# Patient Record
Sex: Female | Born: 1945 | Race: White | Hispanic: No | Marital: Married | Smoking: Former smoker
Health system: Southern US, Community
[De-identification: ages and names within clinical notes are randomized; demographics above are authoritative.]

## PROBLEM LIST (undated history)

## (undated) DIAGNOSIS — M5030 Other cervical disc degeneration, unspecified cervical region: Secondary | ICD-10-CM

## (undated) DIAGNOSIS — Z8679 Personal history of other diseases of the circulatory system: Secondary | ICD-10-CM

## (undated) DIAGNOSIS — K589 Irritable bowel syndrome without diarrhea: Secondary | ICD-10-CM

## (undated) DIAGNOSIS — M47812 Spondylosis without myelopathy or radiculopathy, cervical region: Secondary | ICD-10-CM

## (undated) DIAGNOSIS — R32 Unspecified urinary incontinence: Secondary | ICD-10-CM

## (undated) DIAGNOSIS — K529 Noninfective gastroenteritis and colitis, unspecified: Secondary | ICD-10-CM

## (undated) DIAGNOSIS — F419 Anxiety disorder, unspecified: Secondary | ICD-10-CM

## (undated) DIAGNOSIS — S52502A Unspecified fracture of the lower end of left radius, initial encounter for closed fracture: Secondary | ICD-10-CM

## (undated) DIAGNOSIS — F32A Depression, unspecified: Secondary | ICD-10-CM

## (undated) DIAGNOSIS — I341 Nonrheumatic mitral (valve) prolapse: Secondary | ICD-10-CM

## (undated) DIAGNOSIS — F329 Major depressive disorder, single episode, unspecified: Secondary | ICD-10-CM

## (undated) DIAGNOSIS — G243 Spasmodic torticollis: Secondary | ICD-10-CM

## (undated) DIAGNOSIS — Z973 Presence of spectacles and contact lenses: Secondary | ICD-10-CM

## (undated) HISTORY — PX: CARDIAC CATHETERIZATION: SHX172

## (undated) HISTORY — PX: CATARACT EXTRACTION W/ INTRAOCULAR LENS  IMPLANT, BILATERAL: SHX1307

## (undated) HISTORY — PX: CATARACT EXTRACTION W/ INTRAOCULAR LENS IMPLANT: SHX1309

## (undated) HISTORY — PX: DILATION AND CURETTAGE OF UTERUS: SHX78

---

## 1998-09-17 ENCOUNTER — Ambulatory Visit (HOSPITAL_COMMUNITY): Admission: RE | Admit: 1998-09-17 | Discharge: 1998-09-17 | Payer: Self-pay | Admitting: Obstetrics and Gynecology

## 1999-04-02 ENCOUNTER — Other Ambulatory Visit: Admission: RE | Admit: 1999-04-02 | Discharge: 1999-04-02 | Payer: Self-pay | Admitting: Obstetrics and Gynecology

## 1999-09-08 HISTORY — PX: ROTATOR CUFF REPAIR: SHX139

## 2000-04-04 ENCOUNTER — Inpatient Hospital Stay (HOSPITAL_COMMUNITY): Admission: EM | Admit: 2000-04-04 | Discharge: 2000-04-06 | Payer: Self-pay | Admitting: Emergency Medicine

## 2000-04-04 ENCOUNTER — Encounter: Payer: Self-pay | Admitting: Cardiology

## 2000-04-04 ENCOUNTER — Encounter: Payer: Self-pay | Admitting: Emergency Medicine

## 2000-04-05 ENCOUNTER — Encounter: Payer: Self-pay | Admitting: Gastroenterology

## 2000-04-27 ENCOUNTER — Ambulatory Visit (HOSPITAL_COMMUNITY): Admission: RE | Admit: 2000-04-27 | Discharge: 2000-04-28 | Payer: Self-pay | Admitting: General Surgery

## 2000-04-27 ENCOUNTER — Encounter (INDEPENDENT_AMBULATORY_CARE_PROVIDER_SITE_OTHER): Payer: Self-pay | Admitting: *Deleted

## 2000-04-27 HISTORY — PX: LAPAROSCOPIC CHOLECYSTECTOMY: SUR755

## 2000-08-18 ENCOUNTER — Other Ambulatory Visit: Admission: RE | Admit: 2000-08-18 | Discharge: 2000-08-18 | Payer: Self-pay | Admitting: Obstetrics and Gynecology

## 2001-03-30 ENCOUNTER — Encounter (INDEPENDENT_AMBULATORY_CARE_PROVIDER_SITE_OTHER): Payer: Self-pay | Admitting: *Deleted

## 2001-03-30 ENCOUNTER — Ambulatory Visit (HOSPITAL_COMMUNITY): Admission: RE | Admit: 2001-03-30 | Discharge: 2001-03-30 | Payer: Self-pay | Admitting: *Deleted

## 2001-03-30 HISTORY — PX: COLONOSCOPY W/ POLYPECTOMY: SHX1380

## 2002-04-22 ENCOUNTER — Other Ambulatory Visit: Admission: RE | Admit: 2002-04-22 | Discharge: 2002-04-22 | Payer: Self-pay | Admitting: Obstetrics and Gynecology

## 2002-07-05 ENCOUNTER — Encounter: Admission: RE | Admit: 2002-07-05 | Discharge: 2002-07-05 | Payer: Self-pay | Admitting: Obstetrics and Gynecology

## 2002-07-05 ENCOUNTER — Encounter: Payer: Self-pay | Admitting: Obstetrics and Gynecology

## 2003-04-24 ENCOUNTER — Other Ambulatory Visit: Admission: RE | Admit: 2003-04-24 | Discharge: 2003-04-24 | Payer: Self-pay | Admitting: Obstetrics and Gynecology

## 2003-05-28 ENCOUNTER — Encounter: Admission: RE | Admit: 2003-05-28 | Discharge: 2003-07-17 | Payer: Self-pay | Admitting: Family Medicine

## 2004-05-04 ENCOUNTER — Other Ambulatory Visit: Admission: RE | Admit: 2004-05-04 | Discharge: 2004-05-04 | Payer: Self-pay | Admitting: Obstetrics and Gynecology

## 2005-05-18 ENCOUNTER — Other Ambulatory Visit: Admission: RE | Admit: 2005-05-18 | Discharge: 2005-05-18 | Payer: Self-pay | Admitting: Obstetrics and Gynecology

## 2006-05-23 ENCOUNTER — Other Ambulatory Visit: Admission: RE | Admit: 2006-05-23 | Discharge: 2006-05-23 | Payer: Self-pay | Admitting: Obstetrics and Gynecology

## 2007-06-13 ENCOUNTER — Other Ambulatory Visit: Admission: RE | Admit: 2007-06-13 | Discharge: 2007-06-13 | Payer: Self-pay | Admitting: Obstetrics and Gynecology

## 2008-06-16 ENCOUNTER — Ambulatory Visit (HOSPITAL_COMMUNITY): Admission: RE | Admit: 2008-06-16 | Discharge: 2008-06-16 | Payer: Self-pay | Admitting: Family Medicine

## 2008-06-23 ENCOUNTER — Other Ambulatory Visit: Admission: RE | Admit: 2008-06-23 | Discharge: 2008-06-23 | Payer: Self-pay | Admitting: Obstetrics and Gynecology

## 2009-07-03 ENCOUNTER — Ambulatory Visit (HOSPITAL_COMMUNITY): Admission: RE | Admit: 2009-07-03 | Discharge: 2009-07-03 | Payer: Self-pay | Admitting: Family Medicine

## 2009-09-29 ENCOUNTER — Other Ambulatory Visit: Admission: RE | Admit: 2009-09-29 | Discharge: 2009-09-29 | Payer: Self-pay | Admitting: Obstetrics and Gynecology

## 2010-06-22 ENCOUNTER — Encounter: Admission: RE | Admit: 2010-06-22 | Discharge: 2010-06-22 | Payer: Self-pay | Admitting: Internal Medicine

## 2010-07-13 ENCOUNTER — Ambulatory Visit (HOSPITAL_COMMUNITY): Admission: RE | Admit: 2010-07-13 | Discharge: 2010-07-13 | Payer: Self-pay | Admitting: Family Medicine

## 2010-09-29 ENCOUNTER — Other Ambulatory Visit: Admission: RE | Admit: 2010-09-29 | Discharge: 2010-09-29 | Payer: Self-pay | Admitting: Obstetrics and Gynecology

## 2010-10-04 ENCOUNTER — Ambulatory Visit (HOSPITAL_COMMUNITY)
Admission: RE | Admit: 2010-10-04 | Discharge: 2010-10-04 | Payer: Self-pay | Source: Home / Self Care | Admitting: Internal Medicine

## 2010-11-27 ENCOUNTER — Encounter: Payer: Self-pay | Admitting: Internal Medicine

## 2010-12-13 ENCOUNTER — Other Ambulatory Visit: Payer: Self-pay | Admitting: Internal Medicine

## 2010-12-13 DIAGNOSIS — K529 Noninfective gastroenteritis and colitis, unspecified: Secondary | ICD-10-CM

## 2010-12-13 DIAGNOSIS — R1031 Right lower quadrant pain: Secondary | ICD-10-CM

## 2010-12-13 DIAGNOSIS — R140 Abdominal distension (gaseous): Secondary | ICD-10-CM

## 2010-12-15 ENCOUNTER — Ambulatory Visit
Admission: RE | Admit: 2010-12-15 | Discharge: 2010-12-15 | Disposition: A | Discharge status: Home / Self Care | Payer: Medicare Other | Source: Ambulatory Visit | Attending: Internal Medicine | Admitting: Internal Medicine

## 2010-12-15 DIAGNOSIS — K529 Noninfective gastroenteritis and colitis, unspecified: Secondary | ICD-10-CM

## 2010-12-15 DIAGNOSIS — R1031 Right lower quadrant pain: Secondary | ICD-10-CM

## 2010-12-15 DIAGNOSIS — R140 Abdominal distension (gaseous): Secondary | ICD-10-CM

## 2010-12-15 MED ORDER — IOHEXOL 300 MG/ML  SOLN
100.0000 mL | Freq: Once | INTRAMUSCULAR | Status: AC | PRN
  Administered 2010-12-15: 100 mL via INTRAVENOUS

## 2011-03-25 NOTE — Cardiovascular Report (Signed)
[place]. [facility]  Patient:    Dana [name], Dana [name]                     MRN: [id number] Proc. Date: 04/05/00 Adm. Date:  [id number] Disc. Date: [id number] Attending:  Rosanne Sack CC:         Anna Genre. Little, M.D.                        Cardiac Catheterization  INDICATIONS FOR PROCEDURE:  [name] is a 65 year old female who was admitted with anginal-type pain.  Cardiac enzymes and EKG are negative.  PROCEDURES: 1. Left heart catheterization. 2. Selective right and left coronary arteriography. 3. Ventriculography in the RAO projection.  DESCRIPTION OF PROCEDURE:  The patient was prepped and draped in the usual sterile fashion.  Exposing the right groin, local anesthetic administered with 1% Xylocaine, the Seldinger technique was employed and a 6-French introducer sheath was placed into the right femoral artery.  Selective right and left coronary arteriography and ventriculography in the RAO projection was performed.  Then 6-French Judkins configuration catheters were used.  HEMODYNAMIC MONITORING: 1. Central aortic pressure:  133/57. 2. Left ventricular pressure:  135/14. There was no significant aortic valve gradient noted at time of pullback.  VENTRICULOGRAPHY:  Done in the RAO projection, revealed normal left ventricular systolic function.  The ejection fraction is greater than 60%. Mild mitral regurgitation was seen and mitral valve prolapse was noted.  CORONARY ARTERIOGRAPHY:  There was no calcification noted on fluoroscopy. 1. LEFT MAIN:  Normal. 2. LEFT ANTERIOR DESCENDING ARTERY:  Gave rise to a diagonal, with two small    branches.  The LAD itself crossed the apex, but the heart was free of    disease as were the diagonals. 3. CIRCUMFLEX:  Had two large OM vessels, all were free of disease. 4. RIGHT CORONARY ARTERY:  Gave rise to a PDA only.  This system was free    of disease.  There was minor dampening as the catheter was  engaged, but    cusp injection shows the ostium of the RCA to be normal.  CONCLUSION: 1. Normal left ventricular systolic function. 2. Mitral valve prolapse, with mild mitral regurgitation. 3. No coronary disease.  DISCUSSION:  At this point, I have no explanation for her chest pain; it is clear it is not cardiac in origin.  She is referred back to Dr. Catha Gosselin for follow-up. DD:  04/05/00 TD:  04/06/00 Job: [id number]

## 2011-03-25 NOTE — Op Note (Signed)
[place]. [facility]  Patient:    Dana [name], Dana [name]                     MRN: [id number] Proc. Date: 04/27/00 Adm. Date:  [id number] Disc. Date: [id number] Attending:  Arlis Porta CC:         Anna Genre. Little, M.D.             Thereasa Solo. Little, M.D.                           Operative Report  PREOPERATIVE DIAGNOSIS:  Symptomatic cholelithiasis.  POSTOPERATIVE DIAGNOSIS:  Chronic calculus cholecystitis.  PROCEDURE:  Laparoscopic cholecystectomy.  SURGEON:  Adolph Pollack, M.D.  ASSISTANT:  Anselm Pancoast. Zachery Dakins, M.D.  ANESTHESIA:  General Kaylyn Layer. Michelle Piper, M.D.  INDICATIONS:  This is a 65 year old female with classic biliary colic and was found to have gallstones.  She has normal preoperative liver function tests, and a normal common bile duct diameter.  She presents now for cholecystectomy. She has documented mitral valve prolapse by echocardiogram and has received ampicillin and gentamicin in the holding room preoperatively.  DESCRIPTION OF PROCEDURE:  The patient was placed supine on the operating table and a general anesthetic was administered.  The abdomen was sterilely prepped and draped.  Local anesthetic consisting of 0.5% Marcaine was infiltrated in the subumbilical area.  A subumbilical incision was made incising the skin sharply.  Using blunt dissection, I identified the midline fascia and made a 1 cm incision in the fascia.  A pursestring suture of 0 Vicryl was placed around the fascial edges.  The peritoneal cavity was then entered bluntly and under direct vision.  A Hassan trocar was introduced into the peritoneal cavity and a pneumoperitoneum was created by insufflation of CO2 gas.  The laparoscope was introduced and the right upper quadrant area visualization.  There were adhesions between the omentum and the gallbladder fundus noted.  An 11 mm incision was made in the epigastrium and a similar size trocar was placed in the  peritoneal cavity through the incision.  Two 5 mm incisions were made in the right abdomen through which similar sized trocars were placed into the peritoneal cavity.  The fundus of the gallbladder was grasped and omental adhesions were dissected free from the gallbladder bluntly.  Also, the fundus was retracted toward the right clavicular area and further adhesions between the omentum and duodenum were cleared and dissected free bluntly.  Next, the infundibulum was grasped and it was able to mobilize the infundibulum.  I then identified the junction of the cystic duct and gallbladder, and isolated this area.  I also was able to identify the cystic artery and isolate it.  Because she had normal liver function tests and normal common bile duct diameter, I elected not to perform cholangiogram.  The cystic duct was clipped three times proximally, once distally, and divided sharply.  The cystic artery was clipped twice proximally, once distally, and divided.  Using the electrocautery, I dissected the gallbladder free from the liver bed.  There was a little bit of bleeding noted that came from a crossing vein between the cystic duct and cystic artery, and this was controlled with one clip.  Once the gallbladder was removed from its fossa, I examined the gallbladder fossa, irrigating it.  I did not notice any evidence of bile leakage or bleeding.  The gallbladder was then removed through  the subumbilical incision and the subumbilical fascial defect closed by tightening up and tying down the pursestring suture.  The fascial closure was inspected with the laparoscope and noted to be solid. Once again, reinspected the liver bed, irrigated it, and noticed no bleeding or bile leakage.  The irrigation fluid was evacuated.  The trocars were then removed under direct vision and no bleeding was noted.  The pneumoperitoneum was released.  The skin incisions were closed with 4-0 Monocryl subcuticular  stitches followed by Steri-Strips and sterile dressings.  She tolerated the procedure well without any apparent complications and was taken to the recovery room in satisfactory condition. DD:  04/27/00 TD:  04/29/00 Job: [id number]

## 2011-03-25 NOTE — H&P (Signed)
[place]. [facility]  Patient:    Dana [name], Dana [name]                     MRN: [id number] Adm. Date:  04/04/00                         History and Physical  CHIEF COMPLAINT:  Chest pain.  HISTORY OF PRESENT ILLNESS:  This 65 year old white female with history of hypertension and mitral valve prolapse proven by 2-D echocardiogram is admitted for evaluation of chest pain.  The patient had an episode approximately two weeks prior to admission of a tight squeezing sensation in the left precordial area which moved across the chest towards the right and was unassociated with radiation to the neck, arms, or back.  There was no associated shortness of breath, nausea or vomiting, or diaphoresis.  This episode lasted approximately 30 minutes and then spontaneously resolved.  She did note that it was worsened with movement and had some minimal pleuritic component but no cough and no hemoptysis.  Following that episode, she was asymptomatic up until this afternoon at approximately 1330 hours when she had the sudden onset of again the left precordial squeezing discomfort.  This time it was associated with the development of nausea, diaphoresis, shortness of breath, and she had radiation to her mid back.  It lasted approximately 45 minutes.  She was transported to [facility] Emergency Room.  En route she had some improvement, and following administration of 1 sublingual nitroglycerin tablet, she did have slow resolution of her symptoms over the course of 10 minutes.  At the present time, she is asymptomatic. EKG in the emergency room did show no acute changes.  Of note is the fact that, over the past 30 days, the patient has noted increasing problems with shortness of breath with activities.  Housework has taken her 50% longer to do.  She used to be active on the treadmill 30 minutes, but now after 10 minutes, she develops a tightness in her chest that radiates into the left  upper extremity and resolves with rest.  She does not smoke.  There is no alcohol.  There is no history of any leg or calf pain.  No history of any cough or hemoptysis. Recent April 2001 lab showed a cholesterol of 194 and LDL of 138, HDL 40, triglycerides 84.  TSH was normal.  Of note is the fact that she did not take her antihypertensive medication this morning.  ALLERGIES:  None.  CURRENT MEDICATIONS: 1. Cardizem CD 240, no medications taken today. 2. Premarin/Aygestin 0.625/2.5 daily 3. 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  HOSPITALIZATIONS IN THE PAST:  Childbirth x 2. D&C.  Right shoulder surgery November 2000.  ILLNESSES:  Hypertension, MVP on 2-D echocardiogram, history of pericarditis in 1989.  FAMILY HISTORY:  Her mother is alive at age 76 with hypertension.  Father is deceased secondary to leukemia.  He also had hypertension and stroke. Siblings: Two brothers, one with hypertension.  One sister with hypertension.  SOCIAL HISTORY:  Married 30 years, two children alive and well.  She has not smoked, no alcohol.  She was exercising on a regular basis but has been limited as noted above.  ______ booster in 1996.  REVIEW OF SYSTEMS: ENT: Does wear glasses.  Cardiorespiratory: See above.  GI is unremarkable.  GU is negative.  She sees Dr. Lafonda Mosses  Collins for routine examinations.  Neuromuscular and endocrine are unremarkable.  PHYSICAL EXAMINATION:  GENERAL:  At the time of admission, examination reveals a well-developed, well-nourished white female in no acute distress, asymptomatic.  CHEST:  No chest pain.  SKIN:  Warm and dry.  VITAL SIGNS:  Blood pressure 189/99, pulse 82 and regular, respirations 18, unlabored.  HEENT:  Examination including funduscopic is negative.  NECK: Supple.  Carotids 2+, no bruits.  No masses, no thyroid enlargement.  CHEST:  Clear to percussion and auscultation without rales or  wheezes.  COR:  Regular rhythm without appreciable murmurs, gallops, clicks, or rubs.  ABDOMEN:  Soft and nontender without appreciated mass. Bowel sounds are active.  No CVA discomfort.  No rebound, no masses.  BREASTS:  Without discharge or masses.  PELVIC/RECTAL:  Exam deferred at this point.  Done by Dr. Artist Pais.  EXTREMITIES:  Full range of motion of all four, no edema.  Pedal pulses 2+ and symmetric.  NEUROLOGIC:  Intact without deficits.  LABORATORY DATA:  Pending.  EKG shows normal sinus rhythm without any acute change.  IMPRESSION: 1. Chest pain, history of dyspnea on exertion, and chest discomfort with    activities x 1 month.  Rule out atherosclerotic coronary artery disease    with angina, unstable.  Rule out myocardial infarction.  Doubt she has    pericarditis.  Doubt pulmonary embolus. 2. Mitral valve prolapse by 2-D echocardiogram. 3. History of hypertension, active. 4. History of spasmodic torticollis.  PLAN:  She is admitted to a monitored bed.  Start aspirin, beta blocker, Imdur.  Give her a dose of Cardizem CD which she missed today.  Studies with troponin, CK, repeat EKG in the morning.  Cardiology consultation.  With her history of mitral valve prolapse, she is probably going to be a candidate for cardiac catheterization if she rules out. DD:  04/04/00 TD:  04/04/00 Job: [id number]

## 2011-03-25 NOTE — Procedures (Signed)
[place]. [facility]  Patient:    Dana [name], Dana [name]                     MRN: [id number] Proc. Date: 03/30/01 Adm. Date:  [id number] Attending:  Mingo Amber CC:         Rande Brunt Thomasena Edis, M.D.   Procedure Report  PROCEDURE:  Video colonoscopy.  INDICATIONS:  A 65 year old female with chronic mild diarrhea previously attributed to irritable bowel syndrome, who requests colonoscopic screening as well.  PREPARATION:  Fleets Phospho-Soda and clear liquid diet.  The mucosa is clean throughout.  DEPTH OF INSERTION:  Terminal ileum.  PREPROCEDURE SEDATION:  She received a total of 100 mg of Demerol and 10 mg of Versed intravenously.  In addition, she was on 2 L of nasal cannula O2.  DESCRIPTION OF PROCEDURE:  The Olympus video colonoscope was inserted via the rectum and advanced through a tortuous sigmoid to the splenic flexure.  From this point onward to the cecum, advancement was technically much easier.  The ileocecal valve was traversed, and the terminal ileum appeared normal, as seen in photograph #2.  In photograph #1, there is mild scope trauma in the periappendiceal area.  On withdrawal, the mucosa was carefully evaluated. Multiple random biopsies were taken to rule out significant causes of chronic diarrhea.  In the rectum at 18 cm was a diminutive polyp, removed with the biopsy forceps.  Retroflexed view of the rectum was normal.  The patient tolerated the procedure well.  Pulse, blood pressure, and oximetry testing were stable throughout.  There were no noted complications.  She was observed in recovery for one hour and discharged home alert with a benign abdomen.  IMPRESSION:  Probable normal colonoscopy in the face of irritable bowel syndrome, other than a diminutive rectal polyp, which has been removed.  PLAN:  The patient will be notified of the histology and any necessary follow-up.  If the polyp proves to be adenomatous,  consideration could be given to repeat colonoscopy in three to five years. DD:  03/30/01 TD:  03/30/01 Job: [id number]

## 2012-01-09 ENCOUNTER — Other Ambulatory Visit (HOSPITAL_COMMUNITY): Payer: Self-pay | Admitting: *Deleted

## 2012-01-11 ENCOUNTER — Ambulatory Visit (HOSPITAL_COMMUNITY)
Admission: RE | Admit: 2012-01-11 | Discharge: 2012-01-11 | Disposition: A | Discharge status: Home / Self Care | Payer: Medicare Other | Source: Ambulatory Visit | Attending: Nurse Practitioner | Admitting: Nurse Practitioner

## 2012-01-11 ENCOUNTER — Encounter (HOSPITAL_COMMUNITY): Payer: Self-pay

## 2012-01-11 DIAGNOSIS — M810 Age-related osteoporosis without current pathological fracture: Secondary | ICD-10-CM | POA: Insufficient documentation

## 2012-01-11 MED ORDER — ZOLEDRONIC ACID 5 MG/100ML IV SOLN
5.0000 mg | Freq: Once | INTRAVENOUS | Status: AC
  Administered 2012-01-11: 5 mg via INTRAVENOUS
  Filled 2012-01-11: qty 100

## 2012-01-11 MED ORDER — SODIUM CHLORIDE 0.9 % IV SOLN
Freq: Once | INTRAVENOUS | Status: AC
  Administered 2012-01-11: 14:00:00 via INTRAVENOUS

## 2012-01-11 NOTE — Discharge Instructions (Signed)
Zoledronic Acid injection (Paget's Disease, Osteoporosis) What is this medicine? ZOLEDRONIC ACID (ZOE le dron ik AS id) lowers the amount of calcium loss from bone. It is used to treat Paget's disease and osteoporosis in women. This medicine may be used for other purposes; ask your health care provider or pharmacist if you have questions. What should I tell my health care provider before I take this medicine? They need to know if you have any of these conditions: -aspirin-sensitive asthma -dental disease -kidney disease -low levels of calcium in the blood -past surgery on the parathyroid gland or intestines -an unusual or allergic reaction to zoledronic acid, other medicines, foods, dyes, or preservatives -pregnant or trying to get pregnant -breast-feeding How should I use this medicine? This medicine is for infusion into a vein. It is given by a health care professional in a hospital or clinic setting. Talk to your pediatrician regarding the use of this medicine in children. This medicine is not approved for use in children. Overdosage: If you think you have taken too much of this medicine contact a poison control center or emergency room at once. NOTE: This medicine is only for you. Do not share this medicine with others. What if I miss a dose? It is important not to miss your dose. Call your doctor or health care professional if you are unable to keep an appointment. What may interact with this medicine? -certain antibiotics given by injection -NSAIDs, medicines for pain and inflammation, like ibuprofen or naproxen -some diuretics like bumetanide, furosemide -teriparatide This list may not describe all possible interactions. Give your health care provider a list of all the medicines, herbs, non-prescription drugs, or dietary supplements you use. Also tell them if you smoke, drink alcohol, or use illegal drugs. Some items may interact with your medicine. What should I watch for while  using this medicine? Visit your doctor or health care professional for regular checkups. It may be some time before you see the benefit from this medicine. Do not stop taking your medicine unless your doctor tells you to. Your doctor may order blood tests or other tests to see how you are doing. Women should inform their doctor if they wish to become pregnant or think they might be pregnant. There is a potential for serious side effects to an unborn child. Talk to your health care professional or pharmacist for more information. You should make sure that you get enough calcium and vitamin D while you are taking this medicine. Discuss the foods you eat and the vitamins you take with your health care professional. Some people who take this medicine have severe bone, joint, and/or muscle pain. This medicine may also increase your risk for a broken thigh bone. Tell your doctor right away if you have pain in your upper leg or groin. Tell your doctor if you have any pain that does not go away or that gets worse. What side effects may I notice from receiving this medicine? Side effects that you should report to your doctor or health care professional as soon as possible: -allergic reactions like skin rash, itching or hives, swelling of the face, lips, or tongue -breathing problems -changes in vision -feeling faint or lightheaded, falls -jaw burning, cramping, or pain -muscle cramps, stiffness, or weakness -trouble passing urine or change in the amount of urine Side effects that usually do not require medical attention (report to your doctor or health care professional if they continue or are bothersome): -bone, joint, or muscle pain -fever -  irritation at site where injected -loss of appetite -nausea, vomiting -stomach upset -tired This list may not describe all possible side effects. Call your doctor for medical advice about side effects. You may report side effects to FDA at 1-800-FDA-1088. Where  should I keep my medicine? This drug is given in a hospital or clinic and will not be stored at home. NOTE: This sheet is a summary. It may not cover all possible information. If you have questions about this medicine, talk to your doctor, pharmacist, or health care provider.  2012, [employer]. (04/22/2011 9:08:15 AM) 

## 2013-03-25 ENCOUNTER — Other Ambulatory Visit: Payer: Self-pay | Admitting: Obstetrics and Gynecology

## 2013-03-25 ENCOUNTER — Other Ambulatory Visit (HOSPITAL_COMMUNITY)
Admission: RE | Admit: 2013-03-25 | Discharge: 2013-03-25 | Disposition: A | Discharge status: Home / Self Care | Payer: Medicare Other | Source: Ambulatory Visit | Attending: Obstetrics and Gynecology | Admitting: Obstetrics and Gynecology

## 2013-03-25 DIAGNOSIS — Z1151 Encounter for screening for human papillomavirus (HPV): Secondary | ICD-10-CM | POA: Insufficient documentation

## 2013-03-25 DIAGNOSIS — Z01419 Encounter for gynecological examination (general) (routine) without abnormal findings: Secondary | ICD-10-CM | POA: Insufficient documentation

## 2013-09-30 ENCOUNTER — Encounter (HOSPITAL_COMMUNITY): Payer: Self-pay | Admitting: Emergency Medicine

## 2013-09-30 ENCOUNTER — Emergency Department (HOSPITAL_COMMUNITY): Payer: No Typology Code available for payment source

## 2013-09-30 ENCOUNTER — Emergency Department (HOSPITAL_COMMUNITY)
Admission: EM | Admit: 2013-09-30 | Discharge: 2013-09-30 | Disposition: A | Discharge status: Home / Self Care | Payer: No Typology Code available for payment source | Attending: Emergency Medicine | Admitting: Emergency Medicine

## 2013-09-30 DIAGNOSIS — Y92410 Unspecified street and highway as the place of occurrence of the external cause: Secondary | ICD-10-CM | POA: Insufficient documentation

## 2013-09-30 DIAGNOSIS — Z880 Allergy status to penicillin: Secondary | ICD-10-CM | POA: Insufficient documentation

## 2013-09-30 DIAGNOSIS — M542 Cervicalgia: Secondary | ICD-10-CM

## 2013-09-30 DIAGNOSIS — R072 Precordial pain: Secondary | ICD-10-CM | POA: Insufficient documentation

## 2013-09-30 DIAGNOSIS — M810 Age-related osteoporosis without current pathological fracture: Secondary | ICD-10-CM | POA: Insufficient documentation

## 2013-09-30 DIAGNOSIS — H53149 Visual discomfort, unspecified: Secondary | ICD-10-CM | POA: Insufficient documentation

## 2013-09-30 DIAGNOSIS — Z882 Allergy status to sulfonamides status: Secondary | ICD-10-CM | POA: Insufficient documentation

## 2013-09-30 DIAGNOSIS — Y939 Activity, unspecified: Secondary | ICD-10-CM | POA: Insufficient documentation

## 2013-09-30 DIAGNOSIS — Z79899 Other long term (current) drug therapy: Secondary | ICD-10-CM | POA: Insufficient documentation

## 2013-09-30 DIAGNOSIS — Z87891 Personal history of nicotine dependence: Secondary | ICD-10-CM | POA: Insufficient documentation

## 2013-09-30 DIAGNOSIS — R079 Chest pain, unspecified: Secondary | ICD-10-CM

## 2013-09-30 DIAGNOSIS — R51 Headache: Secondary | ICD-10-CM | POA: Insufficient documentation

## 2013-09-30 MED ORDER — ONDANSETRON HCL 4 MG PO TABS: 4.0000 mg | ORAL_TABLET | Freq: Four times a day (QID) | ORAL | Status: DC

## 2013-09-30 MED ORDER — HYDROCODONE-ACETAMINOPHEN 5-325 MG PO TABS: 2.0000 | ORAL_TABLET | Freq: Four times a day (QID) | ORAL | Status: DC | PRN

## 2013-09-30 MED ORDER — ACETAMINOPHEN 325 MG PO TABS
650.0000 mg | ORAL_TABLET | Freq: Once | ORAL | Status: AC
Start: 2013-09-30 — End: 2013-09-30
  Administered 2013-09-30: 650 mg via ORAL
  Filled 2013-09-30: qty 2

## 2013-09-30 NOTE — ED Notes (Signed)
Pt returned from radiology.

## 2013-09-30 NOTE — ED Notes (Signed)
Pt back from CT
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
## 2013-10-01 NOTE — ED Provider Notes (Signed)
Medical screening examination/treatment/procedure(s) were performed by non-physician practitioner and as supervising physician I was immediately available for consultation/collaboration.  EKG Interpretation   None        Raeford Razor, MD 10/01/13 1436

## 2014-08-01 ENCOUNTER — Encounter (HOSPITAL_COMMUNITY): Payer: Self-pay

## 2014-08-01 ENCOUNTER — Ambulatory Visit (HOSPITAL_COMMUNITY)
Admission: RE | Admit: 2014-08-01 | Discharge: 2014-08-01 | Disposition: A | Discharge status: Home / Self Care | Payer: Medicare Other | Source: Ambulatory Visit | Attending: Obstetrics and Gynecology | Admitting: Obstetrics and Gynecology

## 2014-08-01 ENCOUNTER — Other Ambulatory Visit (HOSPITAL_COMMUNITY): Payer: Self-pay | Admitting: Obstetrics and Gynecology

## 2014-08-01 DIAGNOSIS — M810 Age-related osteoporosis without current pathological fracture: Secondary | ICD-10-CM | POA: Diagnosis not present

## 2014-08-01 MED ORDER — SODIUM CHLORIDE 0.9 % IV SOLN
Freq: Once | INTRAVENOUS | Status: AC
  Administered 2014-08-01: 15:00:00 via INTRAVENOUS

## 2014-08-01 MED ORDER — ZOLEDRONIC ACID 5 MG/100ML IV SOLN
5.0000 mg | Freq: Once | INTRAVENOUS | Status: AC
  Administered 2014-08-01: 5 mg via INTRAVENOUS
  Filled 2014-08-01: qty 100

## 2014-08-01 NOTE — Discharge Instructions (Signed)
Drink fluids/water as tolerated over next 72hrs °Tylenol or Ibuprofen OTC as directed °Continue calcium and Vit D as directed by your MDZoledronic Acid injection (Paget's Disease, Osteoporosis) °What is this medicine? °ZOLEDRONIC ACID (ZOE le dron ik AS id) lowers the amount of calcium loss from bone. It is used to treat Paget's disease and osteoporosis in women. °This medicine may be used for other purposes; ask your health care provider or pharmacist if you have questions. °COMMON BRAND NAME(S): Reclast, Zometa °What should I tell my health care provider before I take this medicine? °They need to know if you have any of these conditions: °-aspirin-sensitive asthma °-cancer, especially if you are receiving medicines used to treat cancer °-dental disease or wear dentures °-infection °-kidney disease °-low levels of calcium in the blood °-past surgery on the parathyroid gland or intestines °-receiving corticosteroids like dexamethasone or prednisone °-an unusual or allergic reaction to zoledronic acid, other medicines, foods, dyes, or preservatives °-pregnant or trying to get pregnant °-breast-feeding °How should I use this medicine? °This medicine is for infusion into a vein. It is given by a health care professional in a hospital or clinic setting. °Talk to your pediatrician regarding the use of this medicine in children. This medicine is not approved for use in children. °Overdosage: If you think you have taken too much of this medicine contact a poison control center or emergency room at once. °NOTE: This medicine is only for you. Do not share this medicine with others. °What if I miss a dose? °It is important not to miss your dose. Call your doctor or health care professional if you are unable to keep an appointment. °What may interact with this medicine? °-certain antibiotics given by injection °-NSAIDs, medicines for pain and inflammation, like ibuprofen or naproxen °-some diuretics like bumetanide,  furosemide °-teriparatide °This list may not describe all possible interactions. Give your health care provider a list of all the medicines, herbs, non-prescription drugs, or dietary supplements you use. Also tell them if you smoke, drink alcohol, or use illegal drugs. Some items may interact with your medicine. °What should I watch for while using this medicine? °Visit your doctor or health care professional for regular checkups. It may be some time before you see the benefit from this medicine. Do not stop taking your medicine unless your doctor tells you to. Your doctor may order blood tests or other tests to see how you are doing. °Women should inform their doctor if they wish to become pregnant or think they might be pregnant. There is a potential for serious side effects to an unborn child. Talk to your health care professional or pharmacist for more information. °You should make sure that you get enough calcium and vitamin D while you are taking this medicine. Discuss the foods you eat and the vitamins you take with your health care professional. °Some people who take this medicine have severe bone, joint, and/or muscle pain. This medicine may also increase your risk for jaw problems or a broken thigh bone. Tell your doctor right away if you have severe pain in your jaw, bones, joints, or muscles. Tell your doctor if you have any pain that does not go away or that gets worse. °Tell your dentist and dental surgeon that you are taking this medicine. You should not have major dental surgery while on this medicine. See your dentist to have a dental exam and fix any dental problems before starting this medicine. Take good care of your teeth while on   this medicine. Make sure you see your dentist for regular follow-up appointments. °What side effects may I notice from receiving this medicine? °Side effects that you should report to your doctor or health care professional as soon as possible: °-allergic reactions  like skin rash, itching or hives, swelling of the face, lips, or tongue °-anxiety, confusion, or depression °-breathing problems °-changes in vision °-eye pain °-feeling faint or lightheaded, falls °-jaw pain, especially after dental work °-mouth sores °-muscle cramps, stiffness, or weakness °-trouble passing urine or change in the amount of urine °Side effects that usually do not require medical attention (report to your doctor or health care professional if they continue or are bothersome): °-bone, joint, or muscle pain °-constipation °-diarrhea °-fever °-hair loss °-irritation at site where injected °-loss of appetite °-nausea, vomiting °-stomach upset °-trouble sleeping °-trouble swallowing °-weak or tired °This list may not describe all possible side effects. Call your doctor for medical advice about side effects. You may report side effects to FDA at 1-800-FDA-1088. °Where should I keep my medicine? °This drug is given in a hospital or clinic and will not be stored at home. °NOTE: This sheet is a summary. It may not cover all possible information. If you have questions about this medicine, talk to your doctor, pharmacist, or health care provider. °© 2015, [employer]. (2013-04-08 10:03:48) ° °

## 2014-11-22 ENCOUNTER — Encounter (HOSPITAL_COMMUNITY): Payer: Self-pay | Admitting: Cardiology

## 2014-11-22 ENCOUNTER — Emergency Department (HOSPITAL_COMMUNITY): Payer: Medicare Other

## 2014-11-22 ENCOUNTER — Emergency Department (HOSPITAL_COMMUNITY)
Admission: EM | Admit: 2014-11-22 | Discharge: 2014-11-22 | Disposition: A | Discharge status: Home / Self Care | Payer: Medicare Other | Attending: Emergency Medicine | Admitting: Emergency Medicine

## 2014-11-22 DIAGNOSIS — Z79899 Other long term (current) drug therapy: Secondary | ICD-10-CM | POA: Diagnosis not present

## 2014-11-22 DIAGNOSIS — S79919A Unspecified injury of unspecified hip, initial encounter: Secondary | ICD-10-CM | POA: Diagnosis not present

## 2014-11-22 DIAGNOSIS — S62102A Fracture of unspecified carpal bone, left wrist, initial encounter for closed fracture: Secondary | ICD-10-CM

## 2014-11-22 DIAGNOSIS — S6992XA Unspecified injury of left wrist, hand and finger(s), initial encounter: Secondary | ICD-10-CM | POA: Diagnosis present

## 2014-11-22 DIAGNOSIS — Z87891 Personal history of nicotine dependence: Secondary | ICD-10-CM | POA: Diagnosis not present

## 2014-11-22 DIAGNOSIS — S3992XA Unspecified injury of lower back, initial encounter: Secondary | ICD-10-CM | POA: Diagnosis not present

## 2014-11-22 DIAGNOSIS — Z8669 Personal history of other diseases of the nervous system and sense organs: Secondary | ICD-10-CM | POA: Diagnosis not present

## 2014-11-22 DIAGNOSIS — S199XXA Unspecified injury of neck, initial encounter: Secondary | ICD-10-CM | POA: Insufficient documentation

## 2014-11-22 DIAGNOSIS — Y9301 Activity, walking, marching and hiking: Secondary | ICD-10-CM | POA: Diagnosis not present

## 2014-11-22 DIAGNOSIS — S34139A Unspecified injury to sacral spinal cord, initial encounter: Secondary | ICD-10-CM | POA: Diagnosis not present

## 2014-11-22 DIAGNOSIS — Z880 Allergy status to penicillin: Secondary | ICD-10-CM | POA: Insufficient documentation

## 2014-11-22 DIAGNOSIS — Y998 Other external cause status: Secondary | ICD-10-CM | POA: Diagnosis not present

## 2014-11-22 DIAGNOSIS — Z8739 Personal history of other diseases of the musculoskeletal system and connective tissue: Secondary | ICD-10-CM | POA: Insufficient documentation

## 2014-11-22 DIAGNOSIS — Y9289 Other specified places as the place of occurrence of the external cause: Secondary | ICD-10-CM | POA: Diagnosis not present

## 2014-11-22 DIAGNOSIS — W010XXA Fall on same level from slipping, tripping and stumbling without subsequent striking against object, initial encounter: Secondary | ICD-10-CM | POA: Diagnosis not present

## 2014-11-22 DIAGNOSIS — W19XXXA Unspecified fall, initial encounter: Secondary | ICD-10-CM

## 2014-11-22 DIAGNOSIS — R52 Pain, unspecified: Secondary | ICD-10-CM

## 2014-11-22 MED ORDER — HYDROCODONE-ACETAMINOPHEN 5-325 MG PO TABS
1.0000 | ORAL_TABLET | Freq: Once | ORAL | Status: AC
  Administered 2014-11-22: 1 via ORAL
  Filled 2014-11-22: qty 1

## 2014-11-22 MED ORDER — ONDANSETRON HCL 4 MG PO TABS: 4.0000 mg | ORAL_TABLET | Freq: Four times a day (QID) | ORAL | Status: DC

## 2014-11-22 MED ORDER — ONDANSETRON 4 MG PO TBDP
4.0000 mg | ORAL_TABLET | Freq: Once | ORAL | Status: AC
  Administered 2014-11-22: 4 mg via ORAL
  Filled 2014-11-22: qty 1

## 2014-11-22 MED ORDER — HYDROCODONE-ACETAMINOPHEN 5-325 MG PO TABS: 0.5000 | ORAL_TABLET | Freq: Four times a day (QID) | ORAL | Status: DC | PRN

## 2014-11-22 NOTE — ED Notes (Signed)
Pt. Is in stable condition upon d/c. Pt escorted from ED by staff.

## 2014-11-22 NOTE — ED Notes (Addendum)
Pt reports that she was pulled down by her dog yesterday. Pt reports left wrist pain, tailbone pain, neck pain. Pt also reports some nausea

## 2014-11-22 NOTE — Discharge Instructions (Signed)
Tailbone Injury The tailbone (coccyx) is the small bone at the lower end of the spine. A tailbone injury may involve stretched ligaments, bruising, or a broken bone (fracture). Women are more vulnerable to this injury due to having a wider pelvis. CAUSES  This type of injury typically occurs from falling and landing on the tailbone. Repeated strain or friction from actions such as rowing and bicycling may also injure the area. The tailbone can be injured during childbirth. Infections or tumors may also press on the tailbone and cause pain. Sometimes, the cause of injury is unknown. SYMPTOMS   Bruising.  Pain when sitting.  Painful bowel movements.  In women, pain during intercourse. DIAGNOSIS  Your caregiver can diagnose a tailbone injury based on your symptoms and a physical exam. X-rays may be taken if a fracture is suspected. Your caregiver may also use an MRI scan imaging test to evaluate your symptoms. TREATMENT  Your caregiver may prescribe medicines to help relieve your pain. Most tailbone injuries heal on their own in 4 to 6 weeks. However, if the injury is caused by an infection or tumor, the recovery period may vary. PREVENTION  Wear appropriate padding and sports gear when bicycling and rowing. This can help prevent an injury from repeated strain or friction. HOME CARE INSTRUCTIONS   Put ice on the injured area.  Put ice in a plastic bag.  Place a towel between your skin and the bag.  Leave the ice on for 15-20 minutes, every hour while awake for the first 1 to 2 days.  Sit on a large, rubber or inflated ring or cushion to ease your pain. Lean forward when sitting to help decrease discomfort.  Avoid sitting for long periods of time.  Increase your activity as the pain allows.  Only take over-the-counter or prescription medicines for pain, discomfort, or fever as directed by your caregiver.  You may use stool softeners if it is painful to have a bowel movement, or as  directed by your caregiver.  Eat a diet with plenty of fiber to help prevent constipation.  Keep all follow-up appointments as directed by your caregiver. SEEK MEDICAL CARE IF:   Your pain becomes worse.  Your bowel movements cause a great deal of discomfort.  You are unable to have a bowel movement.  You have a fever. MAKE SURE YOU:  Understand these instructions.  Will watch your condition.  Will get help right away if you are not doing well or get worse. Document Released: 10/21/2000 Document Revised: 01/16/2012 Document Reviewed: 05/19/2011 [facility] Patient Information 2015 [employer], [place]. This information is not intended to replace advice given to you by your health care provider. Make sure you discuss any questions you have with your health care provider.  Wrist Fracture A wrist fracture is a break or crack in one of the bones of your wrist. Your wrist is made up of eight small bones at the palm of your hand (carpal bones) and two long bones that make up your forearm (radius and ulna).  CAUSES   A direct blow to the wrist.  Falling on an outstretched hand.  Trauma, such as a car accident or a fall. RISK FACTORS Risk factors for wrist fracture include:   Participating in contact and high-risk sports, such as skiing, biking, and ice skating.  Taking steroid medicines.  Smoking.  Being female.  Being Caucasian.  Drinking more than three alcoholic beverages per day.  Having low or lowered bone density (osteoporosis or osteopenia).  Age. Older adults have decreased bone density.  Women who have had menopause.  History of previous fractures. SIGNS AND SYMPTOMS Symptoms of wrist fractures include tenderness, bruising, and inflammation. Additionally, the wrist may hang in an odd position or appear deformed.  DIAGNOSIS Diagnosis may include:  Physical exam.  X-ray. TREATMENT Treatment depends on many factors, including the nature and location of the  fracture, your age, and your activity level. Treatment for wrist fracture can be nonsurgical or surgical.  Nonsurgical Treatment A plaster cast or splint may be applied to your wrist if the bone is in a good position. If the fracture is not in good position, it may be necessary for your health care provider to realign it before applying a splint or cast. Usually, a cast or splint will be worn for several weeks.  Surgical Treatment Sometimes the position of the bone is so far out of place that surgery is required to apply a device to hold it together as it heals. Depending on the fracture, there are a number of options for holding the bone in place while it heals, such as a cast and metal pins.  HOME CARE INSTRUCTIONS  Keep your injured wrist elevated and move your fingers as much as possible.  Do not put pressure on any part of your cast or splint. It may break.   Use a plastic bag to protect your cast or splint from water while bathing or showering. Do not lower your cast or splint into water.  Take medicines only as directed by your health care provider.  Keep your cast or splint clean and dry. If it becomes wet, damaged, or suddenly feels too tight, contact your health care provider right away.  Do not use any tobacco products including cigarettes, chewing tobacco, or electronic cigarettes. Tobacco can delay bone healing. If you need help quitting, ask your health care provider.  Keep all follow-up visits as directed by your health care provider. This is important.  Ask your health care provider if you should take supplements of calcium and vitamins C and D to promote bone healing. SEEK MEDICAL CARE IF:   Your cast or splint is damaged, breaks, or gets wet.  You have a fever.  You have chills.  You have continued severe pain or more swelling than you did before the cast was put on. SEEK IMMEDIATE MEDICAL CARE IF:   Your hand or fingernails on the injured arm turn blue or gray,  or feel cold or numb.  You have decreased feeling in the fingers of your injured arm. MAKE SURE YOU:  Understand these instructions.  Will watch your condition.  Will get help right away if you are not doing well or get worse. Document Released: 08/03/2005 Document Revised: 03/10/2014 Document Reviewed: 11/11/2011 [facility] Patient Information 2015 [employer], [place]. This information is not intended to replace advice given to you by your health care provider. Make sure you discuss any questions you have with your health care provider.

## 2014-11-22 NOTE — ED Provider Notes (Signed)
CSN: [id number]     Arrival date & time 11/22/14  1414 History   First MD Initiated Contact with Patient 11/22/14 1701     Chief Complaint  Patient presents with  . Wrist Pain  . Rectal Pain  . Hip Pain     (Consider location/radiation/quality/duration/timing/severity/associated sxs/prior Treatment) HPI  This is a 69 year old female with a history of dystonia and osteoporosis who presents emergency Department with chief complaint of neck pain, tailbone pain and left wrist pain after a fall which occurred yesterday. The patient was walking her dog when another dog approached and her dog lunged at it. She slipped on the wet pavement and fell backward onto sacrum and onto a left outstretched hand. She had immediate severe pain in the left wrist and pain in her sacrum. She was able to get herself up after a few minutes and ambulate home. She was helping her pain would go away. However, it did not. She complains of significant swelling in the left wrist and bruising. She also complains of neck pain and lower back pain. She denies hitting her head or losing consciousness.  Past Medical History  Diagnosis Date  . Dystonia   . Osteoporosis    Past Surgical History  Procedure Laterality Date  . Rotator cuff repair      2004/ right side  . Gallbladder surgery      2001   History reviewed. No pertinent family history. History  Substance Use Topics  . Smoking status: Former Smoker -- 1.00 packs/day    Types: Cigarettes    Quit date: 01/10/1989  . Smokeless tobacco: Not on file  . Alcohol Use: No   OB History    No data available     Review of Systems  Ten systems reviewed and are negative for acute change, except as noted in the HPI.    Allergies  Penicillins and Sulfa antibiotics  Home Medications   Prior to Admission medications   Medication Sig Start Date End Date Taking? Authorizing Provider  Ascorbic Acid (VITAMIN C) 1000 MG tablet Take 1,000 mg by mouth 2 (two) times  daily.    Historical Provider, MD  calcium-vitamin D (OSCAL WITH D) 500-200 MG-UNIT per tablet Take 1 tablet by mouth 2 (two) times daily.    Historical Provider, MD  cholecalciferol (VITAMIN D) 1000 UNITS tablet Take 1,000 Units by mouth daily. Pt. Takes vit. D 5,000units/day    Historical Provider, MD  clonazePAM (KLONOPIN) 1 MG tablet Take 1 mg by mouth 3 (three) times daily.     Historical Provider, MD  fish oil-omega-3 fatty acids 1000 MG capsule Take 1 g by mouth daily. Pt. Takes brand name [place].    Historical Provider, MD  HYDROcodone-acetaminophen (NORCO) 5-325 MG per tablet Take 0.5-1 tablets by mouth every 6 (six) hours as needed. 11/22/14   Arthor Captain, PA-C  Multiple Vitamin (MULTIVITAMIN) capsule Take 1 capsule by mouth daily.    Historical Provider, MD  OnabotulinumtoxinA (BOTOX IJ) Inject as directed as directed. Patient receives Botox injections every 5 months at [facility] for her dystonia.    Historical Provider, MD  ondansetron (ZOFRAN) 4 MG tablet Take 1 tablet (4 mg total) by mouth every 6 (six) hours. 11/22/14   Arthor Captain, PA-C  vitamin B-12 (CYANOCOBALAMIN) 100 MCG tablet Take 50 mcg by mouth daily.    Historical Provider, MD  Zoledronic Acid (RECLAST IV) Inject into the vein.    Historical Provider, MD   BP 139/74 mmHg  Pulse  72  Temp(Src) 98.1 F (36.7 C) (Oral)  Resp 16  Wt 85 lb (38.556 kg)  SpO2 99% Physical Exam  Constitutional: She is oriented to person, place, and time. She appears well-developed and well-nourished. No distress.  HENT:  Head: Normocephalic and atraumatic.  Eyes: Conjunctivae are normal. No scleral icterus.  Neck: Normal range of motion.  Cardiovascular: Normal rate, regular rhythm and normal heart sounds.  Exam reveals no gallop and no friction rub.   No murmur heard. Pulmonary/Chest: Effort normal and breath sounds normal. No respiratory distress.  Abdominal: Soft. Bowel sounds are normal. She exhibits no distension and no mass. There  is no tenderness. There is no guarding.  Musculoskeletal:  No midline spinal tenderness Full range of motion of the neck. No sacral bruising, deformities or step-off.  Left wrist is swollen with ecchymosis over the Volar surface. She is able to wiggle her fingers. Pulses and sensation is intact.  Neurological: She is alert and oriented to person, place, and time.  Skin: Skin is warm and dry. She is not diaphoretic.    ED Course  Procedures (including critical care time) Labs Review Labs Reviewed - No data to display  Imaging Review Dg Cervical Spine Complete  11/22/2014   CLINICAL DATA:  Patient was dragged to the ground by her dog while walking yesterday, and attempted to break the fall using her left hand. Neck pain. Initial encounter.  EXAM: CERVICAL SPINE  4+ VIEWS  COMPARISON:  CT cervical spine 09/30/2013.  FINDINGS: Slight straightening of the usual cervical lordosis, more pronounced than on the prior CT. Anatomic posterior alignment. No visible fractures. Normal prevertebral soft tissues. Disc space narrowing and endplate hypertrophic changes at C5-6 and C6-7, worse at C5-6, unchanged. Facet joints intact throughout with mild diffuse degenerative changes. Oblique views demonstrate severe right foraminal stenosis and mild left foraminal stenosis at C3-4, unchanged. Remaining neural foramina widely patent. No static evidence of instability.  IMPRESSION: 1. Slight straightening of the usual cervical lordosis which may be due to positioning and/or spasm. 2. No evidence of fracture or static signs of instability. 3. Moderate degenerative disc disease and spondylosis at C5-6 and C6-7, worse at C5-6. 4. Severe right and mild left foraminal stenoses at C3.   Electronically Signed   By: Hulan Saashomas  Lawrence M.D.   On: 11/22/2014 15:58   Dg Lumbar Spine Complete  11/22/2014   CLINICAL DATA:  Pulled down by dog last night. Wrist pain, tailbone pain.  EXAM: LUMBAR SPINE - COMPLETE 4+ VIEW  COMPARISON:   CT abdomen and pelvis 12/15/2010  FINDINGS: Prior cholecystectomy. Degenerative disc disease changes at L4-5 and L5-S1. Degenerative facet disease at these levels as well. Mild degenerative disc disease changes also at at L1-2. No fracture or subluxation. SI joints are symmetric and unremarkable.  IMPRESSION: Degenerative disc and facet disease.  No acute bony abnormality.   Electronically Signed   By: Charlett NoseKevin  Dover M.D.   On: 11/22/2014 15:55   Dg Wrist Complete Left  11/22/2014   CLINICAL DATA:  Patient was dragged to the ground by her dog while walking yesterday, and attempted to break the fall using her left hand. Pain and swelling diffusely involving the left wrist. Initial encounter.  EXAM: LEFT WRIST - COMPLETE 3+ VIEW  COMPARISON:  None.  FINDINGS: Impacted, nondisplaced fracture involving the distal radius, with the fracture line likely extending to the articular surface, without angulation. No other fractures. Mild periarticular osseous demineralization. Mild narrowing of the scaphotrapezium and scaphotrapezoid joint  spaces. Remaining joint spaces well preserved.  IMPRESSION: Acute traumatic nondisplaced impacted intra-articular fracture involving the distal radius without angulation.   Electronically Signed   By: Hulan Saas M.D.   On: 11/22/2014 15:53     EKG Interpretation None       SPLINT APPLICATION Date/Time: 6:03 PM Authorized by: Arthor Captain Consent: Verbal consent obtained. Risks and benefits: risks, benefits and alternatives were discussed Consent given by: patient Splint applied by: orthopedic technician Location details: left wrist Splint type: sugar tong Supplies used: webril, fiberglass Post-procedure: The splinted body part was neurovascularly unchanged following the procedure. Patient tolerance: Patient tolerated the procedure well with no immediate complications.     MDM   Final diagnoses:  Left wrist fracture, closed, initial encounter  Fall,  initial encounter  Tailbone injury, initial encounter    Patient with left wrist fracture. There are no other acute abnormalities on film. She placed in a sugar tong splint. She can follow with Dr. Shelle Iron, who is her orthopedist. Norco at discharge. CMS intact after splint application. I personally reviewed the imaging tests through PACS system. I have reviewed and interpreted Lab values. I reviewed available ER/hospitalization records through the EMR    Patient seen in shared visit with attending physician.      Arthor Captain, PA-C 11/24/14 1614  Ethelda Chick, MD 11/25/14 [phone]

## 2014-11-22 NOTE — Progress Notes (Signed)
Orthopedic Tech Progress Note Patient Details:  Dana [name] 03/15/1946 [id number] Applied fiberglass sugar tong splint to LUE.  Pulses, sensation, motion intact before and after splinting.  Capillary refill less than 2 seconds before and after splinting. Ortho Devices Type of Ortho Device: Sugartong splint Ortho Device/Splint Location: LUE Ortho Device/Splint Interventions: Application   Lesle ChrisGilliland, Amadeus Oyama L 11/22/2014, 6:35 PM

## 2014-11-26 ENCOUNTER — Encounter (HOSPITAL_BASED_OUTPATIENT_CLINIC_OR_DEPARTMENT_OTHER): Payer: Self-pay | Admitting: *Deleted

## 2014-11-26 NOTE — Progress Notes (Signed)
NPO AFTER MN WITH EXCEPTION CLEAR LIQUIDS UNTIL 0830 (NO CREAM/ MILK  PRODUCTS).  ARRIVE AT 1315. NEEDS HG. WILL TAKE TYLENOL AM DOS W/ SIPS OF WATER AND IF NEEDED WILL TAKE ZOFRAN.

## 2014-11-27 ENCOUNTER — Ambulatory Visit (HOSPITAL_BASED_OUTPATIENT_CLINIC_OR_DEPARTMENT_OTHER): Payer: Medicare Other | Admitting: Anesthesiology

## 2014-11-27 ENCOUNTER — Ambulatory Visit (HOSPITAL_BASED_OUTPATIENT_CLINIC_OR_DEPARTMENT_OTHER)
Admission: RE | Admit: 2014-11-27 | Discharge: 2014-11-27 | Disposition: A | Discharge status: Home / Self Care | Payer: Medicare Other | Source: Ambulatory Visit | Attending: Orthopedic Surgery | Admitting: Orthopedic Surgery

## 2014-11-27 ENCOUNTER — Encounter (HOSPITAL_BASED_OUTPATIENT_CLINIC_OR_DEPARTMENT_OTHER)
Admission: RE | Disposition: A | Discharge status: Home / Self Care | Payer: Self-pay | Source: Ambulatory Visit | Attending: Orthopedic Surgery

## 2014-11-27 ENCOUNTER — Encounter (HOSPITAL_BASED_OUTPATIENT_CLINIC_OR_DEPARTMENT_OTHER): Payer: Self-pay

## 2014-11-27 DIAGNOSIS — S52502A Unspecified fracture of the lower end of left radius, initial encounter for closed fracture: Secondary | ICD-10-CM

## 2014-11-27 DIAGNOSIS — S52552A Other extraarticular fracture of lower end of left radius, initial encounter for closed fracture: Secondary | ICD-10-CM | POA: Insufficient documentation

## 2014-11-27 DIAGNOSIS — Z888 Allergy status to other drugs, medicaments and biological substances status: Secondary | ICD-10-CM | POA: Diagnosis not present

## 2014-11-27 DIAGNOSIS — X58XXXA Exposure to other specified factors, initial encounter: Secondary | ICD-10-CM | POA: Diagnosis not present

## 2014-11-27 DIAGNOSIS — Z881 Allergy status to other antibiotic agents status: Secondary | ICD-10-CM | POA: Diagnosis not present

## 2014-11-27 DIAGNOSIS — Z880 Allergy status to penicillin: Secondary | ICD-10-CM | POA: Diagnosis not present

## 2014-11-27 DIAGNOSIS — M810 Age-related osteoporosis without current pathological fracture: Secondary | ICD-10-CM | POA: Diagnosis not present

## 2014-11-27 DIAGNOSIS — I341 Nonrheumatic mitral (valve) prolapse: Secondary | ICD-10-CM | POA: Insufficient documentation

## 2014-11-27 DIAGNOSIS — Z87891 Personal history of nicotine dependence: Secondary | ICD-10-CM | POA: Insufficient documentation

## 2014-11-27 HISTORY — DX: Irritable bowel syndrome, unspecified: K58.9

## 2014-11-27 HISTORY — DX: Noninfective gastroenteritis and colitis, unspecified: K52.9

## 2014-11-27 HISTORY — DX: Spasmodic torticollis: G24.3

## 2014-11-27 HISTORY — PX: CLOSED REDUCTION RADIAL SHAFT: SHX5008

## 2014-11-27 HISTORY — DX: Spondylosis without myelopathy or radiculopathy, cervical region: M47.812

## 2014-11-27 HISTORY — DX: Other cervical disc degeneration, unspecified cervical region: M50.30

## 2014-11-27 HISTORY — DX: Nonrheumatic mitral (valve) prolapse: I34.1

## 2014-11-27 HISTORY — DX: Personal history of other diseases of the circulatory system: Z86.79

## 2014-11-27 HISTORY — DX: Unspecified urinary incontinence: R32

## 2014-11-27 HISTORY — DX: Unspecified fracture of the lower end of left radius, initial encounter for closed fracture: S52.502A

## 2014-11-27 HISTORY — DX: Presence of spectacles and contact lenses: Z97.3

## 2014-11-27 LAB — POCT HEMOGLOBIN-HEMACUE: Hemoglobin: 11.9 g/dL — ABNORMAL LOW (ref 12.0–15.0)

## 2014-11-27 SURGERY — CLOSED REDUCTION, FRACTURE, RADIUS, SHAFT
Anesthesia: General | Site: Arm Lower | Laterality: Left

## 2014-11-27 MED ORDER — CLINDAMYCIN PHOSPHATE 900 MG/50ML IV SOLN
INTRAVENOUS | Status: AC
  Filled 2014-11-27: qty 50

## 2014-11-27 MED ORDER — LACTATED RINGERS IV SOLN
INTRAVENOUS | Status: DC
  Administered 2014-11-27: 14:00:00 via INTRAVENOUS
  Filled 2014-11-27: qty 1000

## 2014-11-27 MED ORDER — PROPOFOL 10 MG/ML IV BOLUS
INTRAVENOUS | Status: DC | PRN
  Administered 2014-11-27: 110 mg via INTRAVENOUS

## 2014-11-27 MED ORDER — HYDROCODONE-ACETAMINOPHEN 5-300 MG PO TABS: 1.0000 | ORAL_TABLET | Freq: Four times a day (QID) | ORAL | Status: DC | PRN

## 2014-11-27 MED ORDER — CHLORHEXIDINE GLUCONATE 4 % EX LIQD
60.0000 mL | Freq: Once | CUTANEOUS | Status: DC
  Filled 2014-11-27: qty 60

## 2014-11-27 MED ORDER — FENTANYL CITRATE 0.05 MG/ML IJ SOLN
INTRAMUSCULAR | Status: AC
  Filled 2014-11-27: qty 4

## 2014-11-27 MED ORDER — MEPERIDINE HCL 25 MG/ML IJ SOLN
6.2500 mg | INTRAMUSCULAR | Status: DC | PRN
  Filled 2014-11-27: qty 1

## 2014-11-27 MED ORDER — CLINDAMYCIN PHOSPHATE 900 MG/50ML IV SOLN
900.0000 mg | INTRAVENOUS | Status: AC
  Administered 2014-11-27: 900 mg via INTRAVENOUS
  Filled 2014-11-27: qty 50

## 2014-11-27 MED ORDER — ACETAMINOPHEN-CODEINE #3 300-30 MG PO TABS
1.0000 | ORAL_TABLET | ORAL | Status: DC | PRN
  Administered 2014-11-27: 1 via ORAL
  Filled 2014-11-27: qty 1

## 2014-11-27 MED ORDER — ACETAMINOPHEN-CODEINE #3 300-30 MG PO TABS
ORAL_TABLET | ORAL | Status: AC
  Filled 2014-11-27: qty 1

## 2014-11-27 MED ORDER — FENTANYL CITRATE 0.05 MG/ML IJ SOLN
25.0000 ug | INTRAMUSCULAR | Status: DC | PRN
  Administered 2014-11-27: 25 ug via INTRAVENOUS
  Filled 2014-11-27: qty 1

## 2014-11-27 MED ORDER — DOCUSATE SODIUM 100 MG PO CAPS: 100.0000 mg | ORAL_CAPSULE | Freq: Two times a day (BID) | ORAL | Status: DC

## 2014-11-27 MED ORDER — LIDOCAINE HCL (CARDIAC) 20 MG/ML IV SOLN
INTRAVENOUS | Status: DC | PRN
  Administered 2014-11-27: 50 mg via INTRAVENOUS

## 2014-11-27 MED ORDER — FENTANYL CITRATE 0.05 MG/ML IJ SOLN
INTRAMUSCULAR | Status: DC | PRN
  Administered 2014-11-27: 25 ug via INTRAVENOUS
  Administered 2014-11-27: 50 ug via INTRAVENOUS
  Administered 2014-11-27: 25 ug via INTRAVENOUS

## 2014-11-27 MED ORDER — SODIUM CHLORIDE 0.9 % IR SOLN
Status: DC | PRN
  Administered 2014-11-27: 500 mL

## 2014-11-27 MED ORDER — FENTANYL CITRATE 0.05 MG/ML IJ SOLN
INTRAMUSCULAR | Status: AC
  Filled 2014-11-27: qty 2

## 2014-11-27 MED ORDER — PROMETHAZINE HCL 25 MG/ML IJ SOLN
6.2500 mg | INTRAMUSCULAR | Status: DC | PRN
  Filled 2014-11-27: qty 1

## 2014-11-27 MED ORDER — SUCCINYLCHOLINE CHLORIDE 20 MG/ML IJ SOLN
INTRAMUSCULAR | Status: DC | PRN
  Administered 2014-11-27: 80 mg via INTRAVENOUS

## 2014-11-27 MED ORDER — BUPIVACAINE HCL (PF) 0.25 % IJ SOLN
INTRAMUSCULAR | Status: DC | PRN
  Administered 2014-11-27: 10 mL

## 2014-11-27 MED ORDER — DEXAMETHASONE SODIUM PHOSPHATE 4 MG/ML IJ SOLN
INTRAMUSCULAR | Status: DC | PRN
  Administered 2014-11-27: 4 mg via INTRAVENOUS

## 2014-11-27 MED ORDER — ONDANSETRON HCL 4 MG/2ML IJ SOLN
INTRAMUSCULAR | Status: DC | PRN
  Administered 2014-11-27: 4 mg via INTRAVENOUS

## 2014-11-27 MED ORDER — ACETAMINOPHEN-CODEINE #3 300-30 MG PO TABS: 1.0000 | ORAL_TABLET | ORAL | Status: DC | PRN

## 2014-11-27 MED ORDER — VITAMIN C 500 MG PO TABS: 500.0000 mg | ORAL_TABLET | Freq: Every day | ORAL | Status: DC

## 2014-11-27 SURGICAL SUPPLY — 40 items
0.45 kwire ×3 IMPLANT
0.62 kwire ×6 IMPLANT
BANDAGE CONFORM 3  STR LF (GAUZE/BANDAGES/DRESSINGS) ×3 IMPLANT
BANDAGE ELASTIC 3 VELCRO ST LF (GAUZE/BANDAGES/DRESSINGS) ×6 IMPLANT
BLADE SURG 15 STRL LF DISP TIS (BLADE) ×2 IMPLANT
BLADE SURG 15 STRL SS (BLADE) ×4
BNDG CMPR 9X4 STRL LF SNTH (GAUZE/BANDAGES/DRESSINGS) ×2
BNDG CONFORM 3 STRL LF (GAUZE/BANDAGES/DRESSINGS) ×3 IMPLANT
BNDG ESMARK 4X9 LF (GAUZE/BANDAGES/DRESSINGS) ×4 IMPLANT
BNDG GAUZE ELAST 4 BULKY (GAUZE/BANDAGES/DRESSINGS) ×4 IMPLANT
CORDS BIPOLAR (ELECTRODE) ×4 IMPLANT
COVER TABLE BACK 60X90 (DRAPES) ×4 IMPLANT
CUFF TOURNIQUET SINGLE 18IN (TOURNIQUET CUFF) ×4 IMPLANT
DRAPE EXTREMITY T 121X128X90 (DRAPE) ×4 IMPLANT
DRAPE LG THREE QUARTER DISP (DRAPES) ×4 IMPLANT
DRAPE OEC MINIVIEW 54X84 (DRAPES) ×4 IMPLANT
DRAPE SURG 17X23 STRL (DRAPES) ×3 IMPLANT
GAUZE XEROFORM 1X8 LF (GAUZE/BANDAGES/DRESSINGS) ×4 IMPLANT
GLOVE BIO SURGEON STRL SZ8 (GLOVE) ×4 IMPLANT
GLOVE BIOGEL PI IND STRL 8.5 (GLOVE) ×2 IMPLANT
GLOVE BIOGEL PI INDICATOR 8.5 (GLOVE) ×2
GOWN STRL REUS W/TWL LRG LVL3 (GOWN DISPOSABLE) ×4 IMPLANT
GOWN STRL REUS W/TWL XL LVL3 (GOWN DISPOSABLE) ×4 IMPLANT
K-WIRE .045X4 (WIRE) ×3 IMPLANT
K-WIRE .062X4 (WIRE) ×6 IMPLANT
NDL HYPO 25X1 1.5 SAFETY (NEEDLE) IMPLANT
NEEDLE HYPO 25X1 1.5 SAFETY (NEEDLE) ×4 IMPLANT
NS IRRIG 500ML POUR BTL (IV SOLUTION) ×4 IMPLANT
PACK BASIN DAY SURGERY FS (CUSTOM PROCEDURE TRAY) ×4 IMPLANT
PAD CAST 3X4 CTTN HI CHSV (CAST SUPPLIES) ×1 IMPLANT
PADDING CAST COTTON 3X4 STRL (CAST SUPPLIES) ×4
SPLINT FIBERGLASS 3X35 (CAST SUPPLIES) ×3 IMPLANT
SPONGE GAUZE 4X4 12PLY (GAUZE/BANDAGES/DRESSINGS) ×4 IMPLANT
SPONGE GAUZE 4X4 12PLY STER LF (GAUZE/BANDAGES/DRESSINGS) ×3 IMPLANT
STOCKINETTE 4X48 STRL (DRAPES) ×4 IMPLANT
SYR BULB 3OZ (MISCELLANEOUS) ×4 IMPLANT
SYR CONTROL 10ML LL (SYRINGE) ×3 IMPLANT
TOWEL OR 17X24 6PK STRL BLUE (TOWEL DISPOSABLE) ×8 IMPLANT
TRAY DSU PREP LF (CUSTOM PROCEDURE TRAY) ×4 IMPLANT
UNDERPAD 30X30 INCONTINENT (UNDERPADS AND DIAPERS) ×4 IMPLANT

## 2014-11-27 NOTE — Anesthesia Procedure Notes (Signed)
Procedure Name: Intubation Date/Time: 11/27/2014 2:42 PM Performed by: Maris BergerENENNY, Dana Head T Pre-anesthesia Checklist: Patient identified, Emergency Drugs available, Suction available and Patient being monitored Patient Re-evaluated:Patient Re-evaluated prior to inductionOxygen Delivery Method: Circle System Utilized Preoxygenation: Pre-oxygenation with 100% oxygen Intubation Type: IV induction, Rapid sequence and Cricoid Pressure applied Laryngoscope Size: Mac and 3 Grade View: Grade I Tube type: Oral Tube size: 7.0 mm Number of attempts: 1 Airway Equipment and Method: Stylet and Oral airway Placement Confirmation: ETT inserted through vocal cords under direct vision,  positive ETCO2 and breath sounds checked- equal and bilateral Secured at: 21 cm Tube secured with: Tape Dental Injury: Teeth and Oropharynx as per pre-operative assessment

## 2014-11-27 NOTE — Discharge Instructions (Signed)
KEEP BANDAGE CLEAN AND DRY CALL OFFICE FOR F/U APPT [phone] IN 12 DAYS [facility] PHONE [phone] KEEP HAND ELEVATED ABOVE HEART OK TO APPLY ICE TO OPERATIVE AREA CONTACT OFFICE IF ANY WORSENING PAIN OR CONCERNS.   Cast or Splint Care Casts and splints support injured limbs and keep bones from moving while they heal.  HOME CARE  Keep the cast or splint uncovered during the drying period.  A plaster cast can take 24 to 48 hours to dry.  A fiberglass cast will dry in less than 1 hour.  Do not rest the cast on anything harder than a pillow for 24 hours.  Do not put weight on your injured limb. Do not put pressure on the cast. Wait for your doctor's approval.  Keep the cast or splint dry.  Cover the cast or splint with a plastic bag during baths or wet weather.  If you have a cast over your chest and belly (trunk), take sponge baths until the cast is taken off.  If your cast gets wet, dry it with a towel or blow dryer. Use the cool setting on the blow dryer.  Keep your cast or splint clean. Wash a dirty cast with a damp cloth.  Do not put any objects under your cast or splint.  Do not scratch the skin under the cast with an object. If itching is a problem, use a blow dryer on a cool setting over the itchy area.  Do not trim or cut your cast.  Do not take out the padding from inside your cast.  Exercise your joints near the cast as told by your doctor.  Raise (elevate) your injured limb on 1 or 2 pillows for the first 1 to 3 days. GET HELP IF:  Your cast or splint cracks.  Your cast or splint is too tight or too loose.  You itch badly under the cast.  Your cast gets wet or has a soft spot.  You have a bad smell coming from the cast.  You get an object stuck under the cast.  Your skin around the cast becomes red or sore.  You have new or more pain after the cast is put on. GET HELP RIGHT AWAY IF:  You have fluid leaking through the cast.  You cannot move  your fingers or toes.  Your fingers or toes turn blue or white or are cool, painful, or puffy (swollen).  You have tingling or lose feeling (numbness) around the injured area.  You have bad pain or pressure under the cast.  You have trouble breathing or have shortness of breath.  You have chest pain. Document Released: 02/23/2011 Document Revised: 06/26/2013 Document Reviewed: 05/02/2013 [facility] Patient Information 2015 [place] [employer] [place], [place]. This information is not intended to replace advice given to you by your health care provider. Make sure you discuss any questions you have with your health care provider.   Post Anesthesia Home Care Instructions  Activity: Get plenty of rest for the remainder of the day. A responsible adult should stay with you for 24 hours following the procedure.  For the next 24 hours, DO NOT: -Drive a car -Advertising copywriterperate machinery -Drink alcoholic beverages -Take any medication unless instructed by your physician -Make any legal decisions or sign important papers.  Meals: Start with liquid foods such as gelatin or soup. Progress to regular foods as tolerated. Avoid greasy, spicy, heavy foods. If nausea and/or vomiting occur, drink only clear liquids until the nausea and/or vomiting subsides.  Call your physician if vomiting continues.  Special Instructions/Symptoms: Your throat may feel dry or sore from the anesthesia or the breathing tube placed in your throat during surgery. If this causes discomfort, gargle with warm salt water. The discomfort should disappear within 24 hours. Call your surgeon if you experience:   1.  Fever over 101.0. 2.  Inability to urinate. 3.  Nausea and/or vomiting. 4.  Extreme swelling or bruising at the surgical site. 5.  Continued bleeding from the incision. 6.  Increased pain, redness or drainage from the incision. 7.  Problems related to your pain medication. 8. Any change in color, movement and/or sensation 9. Any problems and/or  concerns

## 2014-11-27 NOTE — Anesthesia Postprocedure Evaluation (Signed)
  Anesthesia Post-op Note  Patient: Dana [name]  Procedure(s) Performed: Procedure(s): CLOSED REDUCTION DISTAL RADIUS WITH PINS (Left)  Patient Location: PACU  Anesthesia Type:General  Level of Consciousness: awake and alert   Airway and Oxygen Therapy: Patient Spontanous Breathing and Patient connected to nasal cannula oxygen  Post-op Pain: none  Post-op Assessment: Post-op Vital signs reviewed, Patient's Cardiovascular Status Stable, Respiratory Function Stable, Patent Airway and No signs of Nausea or vomiting  Post-op Vital Signs: Reviewed and stable  Last Vitals:  Filed Vitals:   11/27/14 1332  BP: 135/60  Pulse: 70  Temp: 36.9 C  Resp: 18    Complications: No apparent anesthesia complications

## 2014-11-27 NOTE — H&P (Signed)
Dana [name] is an 69 y.o. female.   Chief Complaint: LEFT WRIST INJURY HPI: PT FELL ON OUTSTRETCHED LEFT WRIST PT SUSTAINED CLOSED INJURY TO LEFT DISTAL RADIUS PT HERE FOR SURGERY FOR MILDLY DISPLACED LEFT DISTAL RADIUS NO PRIOR SURGERY TO LEFT WRIST  Past Medical History  Diagnosis Date  . Osteoporosis   . MVP (mitral valve prolapse)   . History of pericarditis     1989  . IBS (irritable bowel syndrome)   . Closed fracture of left distal radius   . DDD (degenerative disc disease), cervical   . Spondylosis of cervical spine   . Urinary incontinence   . Wears glasses   . Colitis   . Torticollis, spasmodic   . Cervical dystonia     TREATED BY NEUROLOGIST AT [facility]--  BOTOX INJECTIONS    Past Surgical History  Procedure Laterality Date  . Rotator cuff repair Right 11/ 2000  . Laparoscopic cholecystectomy  04-27-2000  . Colonoscopy w/ polypectomy  03-30-2001  . Dilation and curettage of uterus  yrs ago  . Cardiac catheterization  04-05-2000  dr al little    normal LVF/  ef 60%/  no coronary disaease/  MVP with mild MR    History reviewed. No pertinent family history. Social History:  reports that she quit smoking about 25 years ago. Her smoking use included Cigarettes. She has a 30 pack-year smoking history. She has never used smokeless tobacco. She reports that she does not drink alcohol or use illicit drugs.  Allergies:  Allergies  Allergen Reactions  . Lactose Intolerance (Gi) Diarrhea  . Penicillins Rash  . Sulfa Antibiotics Rash  . Hydrocodone Itching    Medications Prior to Admission  Medication Sig Dispense Refill  . acetaminophen (TYLENOL) 500 MG tablet Take 1,000 mg by mouth every 6 (six) hours as needed.    . Ascorbic Acid (VITAMIN C) 1000 MG tablet Take 1,000 mg by mouth daily.     . Calcium Carbonate-Vitamin D (CALCIUM + D PO) Take 2 capsules by mouth 3 (three) times daily. Takes Brand--  Bone-Up  (calcium 1200mg .  D 2000units,  Vit C)    .  Cholecalciferol (VITAMIN D3) 5000 UNITS CAPS Take 1 capsule by mouth daily.    . clonazePAM (KLONOPIN) 1 MG tablet Take 1 mg by mouth 3 (three) times daily.     . colestipol (COLESTID) 5 G granules Take 5 g by mouth daily.    Marland Kitchen. ibuprofen (ADVIL,MOTRIN) 200 MG tablet Take 200 mg by mouth every 6 (six) hours as needed.    . Multiple Vitamin (MULTIVITAMIN) capsule Take 1 capsule by mouth daily.    . Omega-3 Krill Oil 500 MG CAPS Take 1 capsule by mouth daily.    . ondansetron (ZOFRAN) 4 MG tablet Take 1 tablet (4 mg total) by mouth every 6 (six) hours. 12 tablet 0  . vitamin B-12 (CYANOCOBALAMIN) 1000 MCG tablet Take 1,000 mcg by mouth daily.    . Zoledronic Acid (RECLAST IV) Inject into the vein. yearly      No results found for this or any previous visit (from the past 48 hour(s)). No results found.  ROS NO RECENT ILLNESSES OR HOSPITALIZATIONS  Blood pressure 135/60, pulse 70, temperature 98.4 F (36.9 C), temperature source Oral, resp. rate 18, height 5\' 4"  (1.626 m), weight 39.151 kg (86 lb 5 oz), SpO2 99 %. Physical Exam  General Appearance:  Alert, cooperative, no distress, appears stated age  Head:  Normocephalic, without obvious abnormality, atraumatic  Eyes:  Pupils equal, conjunctiva/corneas clear,         Throat: Lips, mucosa, and tongue normal; teeth and gums normal  Neck: No visible masses     Lungs:   respirations unlabored  Chest Wall:  No tenderness or deformity  Heart:  Regular rate and rhythm,  Abdomen:   Soft, non-tender,         Extremities: LEFT WRIST: SKIN INTACT, FINGERS WARM WELL PERFUSED ABLE TO EXTEND THUMB AND DIGITS LIMITED WRIST AND FOREARM MOBILITY  Pulses: 2+ and symmetric  Skin: Skin color, texture, turgor normal, no rashes or lesions     Neurologic: Normal   Assessment/Plan *LEFT WRIST DISTAL RADIUS FRACTURE  LEFT WRIST CLOSED REDUCTION AND PINNING POSSIBLE OPEN REDUCTION AND INTERNAL FIXATION  R/B/A DISCUSSED WITH PT IN OFFICE.  PT VOICED  UNDERSTANDING OF PLAN CONSENT SIGNED DAY OF SURGERY PT SEEN AND EXAMINED PRIOR TO OPERATIVE PROCEDURE/DAY OF SURGERY SITE MARKED. QUESTIONS ANSWERED WILL GO HOME FOLLOWING SURGERY  WE ARE PLANNING SURGERY FOR YOUR UPPER EXTREMITY. THE RISKS AND BENEFITS OF SURGERY INCLUDE BUT NOT LIMITED TO BLEEDING INFECTION, DAMAGE TO NEARBY NERVES ARTERIES TENDONS, FAILURE OF SURGERY TO ACCOMPLISH ITS INTENDED GOALS, PERSISTENT SYMPTOMS AND NEED FOR FURTHER SURGICAL INTERVENTION. WITH THIS IN MIND WE WILL PROCEED. I HAVE DISCUSSED WITH THE PATIENT THE PRE AND POSTOPERATIVE REGIMEN AND THE DOS AND DON'TS. PT VOICED UNDERSTANDING AND INFORMED CONSENT SIGNED.  [name] 11/27/2014, 2:27 PM

## 2014-11-27 NOTE — Anesthesia Preprocedure Evaluation (Addendum)
Anesthesia Evaluation   Patient awake    Reviewed: Allergy & Precautions, NPO status , Patient's Chart, lab work & pertinent test results  Airway Mallampati: II   Neck ROM: Full    Dental  (+) Teeth Intact, Dental Advisory Given   Pulmonary former smoker (quit 1990),  breath sounds clear to auscultation        Cardiovascular Rhythm:Regular     Neuro/Psych Neck pain negative psych ROS   GI/Hepatic negative GI ROS, Neg liver ROS,   Endo/Other  negative endocrine ROS  Renal/GU negative Renal ROS     Musculoskeletal  (+) Arthritis -,   Abdominal (+)  Abdomen: soft.    Peds  Hematology negative hematology ROS (+)   Anesthesia Other Findings   Reproductive/Obstetrics                            Anesthesia Physical Anesthesia Plan  ASA: II  Anesthesia Plan: General   Post-op Pain Management:    Induction: Intravenous  Airway Management Planned:   Additional Equipment:   Intra-op Plan:   Post-operative Plan:   Informed Consent: I have reviewed the patients History and Physical, chart, labs and discussed the procedure including the risks, benefits and alternatives for the proposed anesthesia with the patient or authorized representative who has indicated his/her understanding and acceptance.     Plan Discussed with:   Anesthesia Plan Comments:         Anesthesia Quick Evaluation

## 2014-11-27 NOTE — Transfer of Care (Signed)
Immediate Anesthesia Transfer of Care Note  Patient: Dana [name]  Procedure(s) Performed: Procedure(s): CLOSED REDUCTION DISTAL RADIUS WITH PINS (Left)  Patient Location: PACU  Anesthesia Type:General  Level of Consciousness: sedated and responds to stimulation  Airway & Oxygen Therapy: Patient Spontanous Breathing and Patient connected to nasal cannula oxygen  Post-op Assessment: Report given to PACU RN  Post vital signs: Reviewed and stable  Complications: No apparent anesthesia complications

## 2014-11-27 NOTE — Brief Op Note (Signed)
11/27/2014  2:28 PM  PATIENT:  Dana [name]  69 y.o. female  PRE-OPERATIVE DIAGNOSIS:  fracture left distal radius closed  POST-OPERATIVE DIAGNOSIS:  * No post-op diagnosis entered *  PROCEDURE:  Procedure(s) with comments: CLOSED REDUCTION RADIAL SHAFT,possible pinning (Left) - axillary block OPEN REDUCTION INTERNAL FIXATION (ORIF) RADIAL FRACTURE, possible (Left)  SURGEON:  Surgeon(s) and Role:    * Sharma CovertFred W Tevion Laforge, MD - Primary  PHYSICIAN ASSISTANT:   ASSISTANTS: NONE  ANESTHESIA:   GENERAL  EBL:     BLOOD ADMINISTERED:NONE  DRAINS:NONE  LOCAL MEDICATIONS USED: MARCAINE  SPECIMEN:  NONE  DISPOSITION OF SPECIMEN: NONE  COUNTS:  YES  TOURNIQUET:    DICTATION: .DONE  PLAN OF CARE: HOME  PATIENT DISPOSITION: HOME   Delay start of Pharmacological VTE agent (>24hrs) due to surgical blood loss or risk of bleeding: NA

## 2014-11-28 ENCOUNTER — Encounter (HOSPITAL_BASED_OUTPATIENT_CLINIC_OR_DEPARTMENT_OTHER): Payer: Self-pay | Admitting: Orthopedic Surgery

## 2014-11-28 NOTE — Op Note (Signed)
Dana [name], KOCHAN NO.:  [id number]  MEDICAL RECORD NO.:  [id number]  LOCATION:                                 FACILITY:  PHYSICIAN:  Madelynn Done, MD  DATE OF BIRTH:  1946/05/19  DATE OF PROCEDURE:  11/27/2014 DATE OF DISCHARGE:  11/27/2014                              OPERATIVE REPORT   PREOPERATIVE DIAGNOSIS:  Left distal radius extra-articular fracture displaced.  POSTOPERATIVE DIAGNOSIS:  Left distal radius extra-articular fracture displaced.  ATTENDING PHYSICIAN:  [name] IV, MD, who scrubbed and present for the entire procedure.  ASSISTANT SURGEON:  None.  ANESTHESIA:  General via LMA.  SURGICAL PROCEDURE: 1. Closed reduction and percutaneous skeletal fixation of an unstable     distal radius fracture. 2. Radiographs, 3 views, left wrist.  SURGICAL IMPLANTS:  Two 0.0625 K-wires and one 0.045 K-wire.  SURGICAL INDICATIONS:  Ms. Amason is a right-hand dominant female who sustained a closed extra-articular distal radius fracture.  The patient was being followed in the office and noted to have further mild displacement.  It is recommended she undergo the above procedure. Risks, benefits, and alternatives were discussed in detail with the patient and signed informed consent was obtained.  Risks include, but not limited to bleeding; infection; damage to nearby nerves, arteries, or tendons; loss of motion in the wrist and digits; incomplete relief of symptoms; and need for further surgical intervention.  DESCRIPTION OF PROCEDURE:  The patient was properly identified in the preoperative holding area and marked with a permanent marker made on the left wrist to indicate the correct operative site.  The patient was then brought back to the operating room, placed supine on the anesthesia room table.  General anesthesia was administered.  The patient received preoperative antibiotics prior to skin incision.  A well-padded tourniquet was  then placed on left brachium and sealed with 1000 drape. Left upper extremity was then prepped and draped in normal sterile fashion.  Time-out was called.  Correct site was identified.  Procedure was begun.  Attention then turned to the left wrist.  The limb was then elevated.  Tourniquet inflated.  The K-wires were then placed in the radial styloid with the aid of the mini OEC.  Two 0.0625 K-wires were then placed in the radial styloid.  Reduction then carried out and the K- Wires in the styloid engaging the  opposite cortex.  After this, a 0.045 K-wire was then placed dorsally in the ulnar column.  After the construct was completed, final radiographs were obtained.  A K-wire was then cut and bent.  Xeroform bolster was applied around the pin sites.  Sterile compressive bandage was applied.  The patient tolerated the procedure well, placed in a well-padded volar splint, extubated, and taken to recovery room in good condition.  INTRAOPERATIVE RADIOGRAPHS:  Three views of the wrist did show the internal fixation in place in good position.  PLAN:   the patient is going to be discharged home, seen back in the office in approximately 10-12 days for wound check, pin check, application of short-arm cast for a total of 5 weeks, pins in for 4 weeks, and then pins out  at the 4-week mark, and then begin an outpatient therapy regimen.  Compare radiographs of each visit.     Madelynn DoneFred W Shaunette Gassner IV, MD     FWO/MEDQ  D:  11/28/2014  T:  11/28/2014  Job:  [id number]

## 2015-10-27 ENCOUNTER — Other Ambulatory Visit: Payer: Self-pay | Admitting: Internal Medicine

## 2015-10-27 DIAGNOSIS — R319 Hematuria, unspecified: Secondary | ICD-10-CM

## 2015-10-29 ENCOUNTER — Ambulatory Visit
Admission: RE | Admit: 2015-10-29 | Discharge: 2015-10-29 | Disposition: A | Discharge status: Home / Self Care | Payer: Medicare Other | Source: Ambulatory Visit | Attending: Internal Medicine | Admitting: Internal Medicine

## 2015-10-29 DIAGNOSIS — R319 Hematuria, unspecified: Secondary | ICD-10-CM

## 2016-01-15 ENCOUNTER — Ambulatory Visit (INDEPENDENT_AMBULATORY_CARE_PROVIDER_SITE_OTHER): Payer: 59 | Admitting: Licensed Clinical Social Worker

## 2016-01-15 DIAGNOSIS — F321 Major depressive disorder, single episode, moderate: Secondary | ICD-10-CM | POA: Diagnosis not present

## 2016-01-20 ENCOUNTER — Ambulatory Visit (INDEPENDENT_AMBULATORY_CARE_PROVIDER_SITE_OTHER): Payer: 59 | Admitting: Licensed Clinical Social Worker

## 2016-01-20 ENCOUNTER — Ambulatory Visit: Payer: Medicare Other | Admitting: Licensed Clinical Social Worker

## 2016-01-20 DIAGNOSIS — F321 Major depressive disorder, single episode, moderate: Secondary | ICD-10-CM | POA: Diagnosis not present

## 2016-02-01 ENCOUNTER — Encounter: Payer: Self-pay | Admitting: Dietician

## 2016-02-01 ENCOUNTER — Encounter: Payer: Medicare Other | Attending: Internal Medicine | Admitting: Dietician

## 2016-02-01 VITALS — Ht 64.0 in | Wt 86.9 lb

## 2016-02-01 DIAGNOSIS — R634 Abnormal weight loss: Secondary | ICD-10-CM | POA: Diagnosis present

## 2016-02-01 DIAGNOSIS — R627 Adult failure to thrive: Secondary | ICD-10-CM | POA: Insufficient documentation

## 2016-02-01 NOTE — Progress Notes (Signed)
  Medical Nutrition Therapy:  Appt start time: 1055 end time:  1145.   Assessment:  Primary concerns today: Dana [name] is here today since she has colitis for the past 5 years and has lost weight all of a sudden. Normal weight is 112-115 lbs. Is struggling to gain weight since there is very little foods that don't upset her stomach. Feeling depressed. Not seeing a counselor but interested in talking to one. Son-in-law committed suicide last fall and dog passed away last year which is contributing to depression. Having issues with blood pressure being high. Also has dystonia.  Feeling nauseas today. Has a lot of anxiety. Cannot tolerate meats and green vegetables (broccoli/spinach/kale) d/t colitis. Tomatoes sometimes do not agree with her. Can tolerate avocados, potatoes, applesauce, apples, oatmeal, yogurt, carrots, peas, rice, whole wheat bread, squash, cucumbers, iceberg lettuce, chicken, some salmon, almonds, and bananas. Drinks a lot of green tea with honey. Able to tolerate skim milk. Drinks Coke or sweets every once in a while.  Eats 2 meals per day. Has tried whey protein and Muscle Milk to gain weight. Cannot tolerate Boost or Ensure. States she has trouble tolerating higher fat foods, though can have some cheese, almonds, avocado, olive oil, butter, and peanut butter.  Does not have much of an appetite.  Would like to gain 2 lbs per weight and get back to 112 lbs.   Lives with husband who does most of the cooking.   Preferred Learning Style:   No preference indicated   Learning Readiness:   Ready  MEDICATIONS: see list   DIETARY INTAKE:  24-hr recall:  B ( AM): rice chex with skim milk or oatmeal with skim milk and blueberries/peaches or 1% yogurt with toast  Snk ( AM): none  L (4 PM): tuna sandwich or rice, baked potatoes with peas or string beans and small amount of chicken, sometimes avocado with noodles or soup  Snk ( PM): none or yogurt or yogurt bar D ( PM): none Snk (  PM): none Beverages: water, green tea with honey, 2 cups black coffee  Usual physical activity: none, doesn't have the energy  Estimated energy needs: 1600 calories 180 g carbohydrates 120 g protein 44 g fat  Progress Towards Goal(s):  In progress.   Nutritional Diagnosis:  [place]-3.1 Underweight As related to hx of of food intolerances and low appetite d/t colitis .  As evidenced by BMI of 14.9.    Intervention:  Nutrition counseling provided. Plan: Try to eat every 3-5 hours you are awake.  Try [employer] Breakfast Essentials and mix with skim or soy milk or try Muscle Milk again. Aim to have 2 per day if possible. Think about mixing it with coffee if you want to. Incorporate higher fat/calorie foods - peanut butter, avocado, cream cheese, peanuts, cheese or yogurt in snacks or meals.  Try whole milk yogurt (Fage, Choibani) or cottage cheese.  Ask husband to make eggs and grits for breakfast (heat up when you wake up). (Think about buying your own eggs.) Consider talking to a counselor/psychiatrist for help with depression/anxiety.  Teaching Method Utilized:  Visual Auditory Hands on  Handouts given during visit include:  Therapist/counselor resources  Barriers to learning/adherence to lifestyle change: anxiety/depression and colitis   Demonstrated degree of understanding via:  Teach Back   Monitoring/Evaluation:  Dietary intake, exercise, and body weight in 1 month(s).

## 2016-02-01 NOTE — Patient Instructions (Addendum)
Try to eat every 3-5 hours you are awake.  Try [employer] Breakfast Essentials and mix with skim or soy milk or try Muscle Milk again. Aim to have 2 per day if possible. Think about mixing it with coffee if you want to. Incorporate higher fat/calorie foods - peanut butter, avocado, cream cheese, peanuts, cheese or yogurt in snacks or meals.  Try whole milk yogurt (Fage, Choibani) or cottage cheese.  Ask husband to make eggs and grits for breakfast (heat up when you wake up). (Think about buying your own eggs.) Consider talking to a counselor/psychiatrist for help with depression/anxiety.

## 2016-03-03 ENCOUNTER — Ambulatory Visit: Payer: Self-pay | Admitting: Dietician

## 2016-03-17 ENCOUNTER — Encounter: Payer: Self-pay | Admitting: Dietician

## 2016-03-17 ENCOUNTER — Encounter: Payer: Medicare Other | Attending: Internal Medicine | Admitting: Dietician

## 2016-03-17 VITALS — Ht 64.0 in | Wt 86.5 lb

## 2016-03-17 DIAGNOSIS — R627 Adult failure to thrive: Secondary | ICD-10-CM | POA: Diagnosis not present

## 2016-03-17 DIAGNOSIS — R634 Abnormal weight loss: Secondary | ICD-10-CM | POA: Diagnosis present

## 2016-03-17 NOTE — Progress Notes (Signed)
  Medical Nutrition Therapy:  Appt start time: 1055 end time:  1125   Assessment:  Primary concerns today: Dana [name] returns with no weight gain. Started taking Losartan since last visit. Still having flares with the colitis. Has to be really careful about what she eats and feels like she has to force feed herself. Has been having some Muscle Milk but can't have 2 per day. Hasn't tried the [employer] Instant Breakfast but would like to. Tried eggs which did not sit well. Has diarrhea a lot in the morning.   In the process of arranging to talk to a counselor about stress and anxiety.   Cannot tolerate meats and green vegetables (broccoli/spinach/kale) d/t colitis. Tomatoes sometimes do not agree with her. Can tolerate avocados, potatoes, applesauce, apples, oatmeal, yogurt, carrots, peas, rice, whole wheat bread, squash, cucumbers, iceberg lettuce, chicken, some salmon, almonds, and bananas. Drinks a lot of green tea with honey. Able to tolerate skim milk. Drinks Coke or sweets every once in a while.  Cannot tolerate Boost or Ensure. States she has trouble tolerating higher fat foods, though can have some cheese, almonds, avocado, olive oil, butter, and peanut butter.    Would like to gain 2 lbs per weight and get back to 112 lbs.   Lives with husband who does most of the cooking.   Preferred Learning Style:   No preference indicated   Learning Readiness:   Ready  MEDICATIONS: see list   DIETARY INTAKE:  24-hr recall:  B ( AM): rice chex/Cheerios sometimes with [place] full fat yogurt and bacon and dry toast (most "normal" meal she eats) Snk ( AM): none  L (4 PM): tuna sandwich with peas or rice, baked potatoes with peas or string beans and small amount of chicken Snk ( PM): none  D ( PM): none or Muscle Milk Snk ( PM): none or yogurt  Beverages: water, green tea with honey, 2 cups black coffe  Usual physical activity: none, doesn't have the energy  Estimated energy needs: 1600  calories 180 g carbohydrates 120 g protein 44 g fat  Progress Towards Goal(s):  In progress.   Nutritional Diagnosis:  [place]-3.1 Underweight As related to hx of of food intolerances and low appetite d/t colitis .  As evidenced by BMI of 14.9.    Intervention:  Nutrition counseling provided. Plan: Try to eat every 3-5 hours you are awake.  Try [employer] Breakfast Essentials and mix with skim or soy milk or try Muscle Milk again. Aim to have 2 per day if possible. Think about mixing it with coffee if you want to. Continue to have higher fat/calorie foods - peanut butter, avocado, cream cheese, peanuts, cheese or yogurt in snacks or meals.  Try adding peanut butter or avocado to toast in the morning.  Continue having whole milk yogurt (Fage, Choibani) or cottage cheese.  Try non-dairy ice cream (Ben and Jerry's) and have some each day.  Think about mixing non-dairy ice cream with Carnation or Muscle Milk (milkshake).   Teaching Method Utilized:  Visual Auditory Hands on  Handouts given during visit include:  none  Barriers to learning/adherence to lifestyle change: anxiety/depression and colitis   Demonstrated degree of understanding via:  Teach Back   Monitoring/Evaluation:  Dietary intake, exercise, and body weight in 1 month(s).

## 2016-03-17 NOTE — Patient Instructions (Addendum)
Try to eat every 3-5 hours you are awake.  Try [employer] Breakfast Essentials and mix with skim or soy milk or try Muscle Milk again. Aim to have 2 per day if possible. Think about mixing it with coffee if you want to. Continue to have higher fat/calorie foods - peanut butter, avocado, cream cheese, peanuts, cheese or yogurt in snacks or meals.  Try adding peanut butter or avocado to toast in the morning.  Continue having whole milk yogurt (Fage, Choibani) or cottage cheese.  Try non-dairy ice cream (Ben and Jerry's) and have some each day.  Think about mixing non-dairy ice cream with Carnation or Muscle Milk (milkshake).

## 2016-07-27 ENCOUNTER — Ambulatory Visit: Payer: Medicare Other | Admitting: Neurology

## 2016-12-28 ENCOUNTER — Encounter (HOSPITAL_COMMUNITY): Payer: Self-pay | Admitting: Emergency Medicine

## 2016-12-28 ENCOUNTER — Emergency Department (HOSPITAL_COMMUNITY): Payer: Medicare Other

## 2016-12-28 ENCOUNTER — Emergency Department (HOSPITAL_COMMUNITY)
Admission: EM | Admit: 2016-12-28 | Discharge: 2016-12-28 | Disposition: A | Discharge status: Home / Self Care | Payer: Medicare Other | Attending: Emergency Medicine | Admitting: Emergency Medicine

## 2016-12-28 DIAGNOSIS — K529 Noninfective gastroenteritis and colitis, unspecified: Secondary | ICD-10-CM | POA: Diagnosis not present

## 2016-12-28 DIAGNOSIS — R935 Abnormal findings on diagnostic imaging of other abdominal regions, including retroperitoneum: Secondary | ICD-10-CM | POA: Diagnosis not present

## 2016-12-28 DIAGNOSIS — Z79899 Other long term (current) drug therapy: Secondary | ICD-10-CM | POA: Diagnosis not present

## 2016-12-28 DIAGNOSIS — Z87891 Personal history of nicotine dependence: Secondary | ICD-10-CM | POA: Diagnosis not present

## 2016-12-28 DIAGNOSIS — R197 Diarrhea, unspecified: Secondary | ICD-10-CM | POA: Diagnosis present

## 2016-12-28 LAB — CBC
HEMATOCRIT: 38 % (ref 36.0–46.0)
Hemoglobin: 12.8 g/dL (ref 12.0–15.0)
MCH: 31.9 pg (ref 26.0–34.0)
MCHC: 33.7 g/dL (ref 30.0–36.0)
MCV: 94.8 fL (ref 78.0–100.0)
Platelets: 313 10*3/uL (ref 150–400)
RBC: 4.01 MIL/uL (ref 3.87–5.11)
RDW: 13.6 % (ref 11.5–15.5)
WBC: 6.9 10*3/uL (ref 4.0–10.5)

## 2016-12-28 LAB — COMPREHENSIVE METABOLIC PANEL
ALBUMIN: 3.9 g/dL (ref 3.5–5.0)
ALK PHOS: 72 U/L (ref 38–126)
ALT: 21 U/L (ref 14–54)
AST: 30 U/L (ref 15–41)
Anion gap: 6 (ref 5–15)
BILIRUBIN TOTAL: 0.7 mg/dL (ref 0.3–1.2)
BUN: 9 mg/dL (ref 6–20)
CO2: 30 mmol/L (ref 22–32)
Calcium: 9.3 mg/dL (ref 8.9–10.3)
Chloride: 101 mmol/L (ref 101–111)
Creatinine, Ser: 0.62 mg/dL (ref 0.44–1.00)
GFR calc Af Amer: >60 mL/min (ref >60)
GFR calc non Af Amer: >60 mL/min (ref >60)
GLUCOSE: 86 mg/dL (ref 65–99)
Potassium: 3.9 mmol/L (ref 3.5–5.1)
Sodium: 137 mmol/L (ref 135–145)
TOTAL PROTEIN: 6.5 g/dL (ref 6.5–8.1)

## 2016-12-28 LAB — LIPASE, BLOOD: Lipase: 52 U/L — ABNORMAL HIGH (ref 11–51)

## 2016-12-28 MED ORDER — IOPAMIDOL (ISOVUE-300) INJECTION 61%
INTRAVENOUS | Status: AC
  Administered 2016-12-28: 100 mL
  Filled 2016-12-28: qty 100

## 2016-12-28 MED ORDER — METRONIDAZOLE 500 MG PO TABS: 500.0000 mg | ORAL_TABLET | Freq: Four times a day (QID) | ORAL | 0 refills | Status: DC

## 2016-12-28 MED ORDER — SODIUM CHLORIDE 0.9 % IV BOLUS (SEPSIS)
1000.0000 mL | Freq: Once | INTRAVENOUS | Status: AC
  Administered 2016-12-28: 1000 mL via INTRAVENOUS

## 2016-12-28 MED ORDER — CIPROFLOXACIN HCL 500 MG PO TABS: 500.0000 mg | ORAL_TABLET | Freq: Two times a day (BID) | ORAL | 0 refills | Status: DC

## 2016-12-28 NOTE — ED Notes (Signed)
Patient transported to CT 

## 2016-12-28 NOTE — ED Notes (Signed)
Pt departed in NAD.  

## 2016-12-28 NOTE — ED Triage Notes (Signed)
States has had diarrhea x 6 weeks , has seen her dr and they have placed her on meds but she has lost weight , she has hx of colitis

## 2016-12-28 NOTE — ED Provider Notes (Signed)
[facility]-EMERGENCY DEPT Provider Note   CSN: [id number] Arrival date & time: 12/28/16  1124     History   Chief Complaint Chief Complaint  Patient presents with  . Diarrhea    HPI Dana [name] is a 71 y.o. female.  Patient complains of frequent diarrhea for a couple weeks.   The history is provided by the patient. No language interpreter was used.  Diarrhea   This is a recurrent problem. The current episode started more than 2 days ago. The problem occurs 5 to 10 times per day. The problem has not changed since onset.The stool consistency is described as watery. There has been no fever. Pertinent negatives include no abdominal pain, no chills, no headaches and no cough.    Past Medical History:  Diagnosis Date  . Cervical dystonia    TREATED BY NEUROLOGIST AT [facility]--  BOTOX INJECTIONS  . Closed fracture of left distal radius   . Colitis   . DDD (degenerative disc disease), cervical   . History of pericarditis    1989  . IBS (irritable bowel syndrome)   . MVP (mitral valve prolapse)   . Osteoporosis   . Spondylosis of cervical spine   . Torticollis, spasmodic   . Urinary incontinence   . Wears glasses     There are no active problems to display for this patient.   Past Surgical History:  Procedure Laterality Date  . CARDIAC CATHETERIZATION  04-05-2000  dr al little   normal LVF/  ef 60%/  no coronary disaease/  MVP with mild MR  . CLOSED REDUCTION RADIAL SHAFT Left 11/27/2014   Procedure: CLOSED REDUCTION DISTAL RADIUS WITH PINS;  Surgeon: [name], MD;  Location: [facility];  Service: Orthopedics;  Laterality: Left;  . COLONOSCOPY W/ POLYPECTOMY  03-30-2001  . DILATION AND CURETTAGE OF UTERUS  yrs ago  . LAPAROSCOPIC CHOLECYSTECTOMY  04-27-2000  . ROTATOR CUFF REPAIR Right 11/ 2000    OB History    No data available       Home Medications    Prior to Admission medications   Medication Sig Start Date End Date Taking? Authorizing  Provider  Ascorbic Acid (VITAMIN C) 1000 MG tablet Take 1,000 mg by mouth daily.    Yes Historical Provider, MD  CALCIUM PO Take 1 tablet by mouth daily. 3000 units   Yes Historical Provider, MD  cholestyramine (QUESTRAN) 4 g packet Take 4 g by mouth daily as needed. For diarrhea   Yes Historical Provider, MD  clonazePAM (KLONOPIN) 1 MG tablet Take 1 mg by mouth 3 (three) times daily.    Yes Historical Provider, MD  escitalopram (LEXAPRO) 20 MG tablet Take 20 mg by mouth daily.   Yes Historical Provider, MD  ibuprofen (ADVIL,MOTRIN) 200 MG tablet Take 200 mg by mouth every 6 (six) hours as needed.   Yes Historical Provider, MD  Multiple Vitamin (MULTIVITAMIN) capsule Take 1 capsule by mouth daily.   Yes Historical Provider, MD  Omega-3 Krill Oil 500 MG CAPS Take 1 capsule by mouth daily.   Yes Historical Provider, MD  RESVERATROL PO Take 1 tablet by mouth daily. 500mg    Yes Historical Provider, MD  TURMERIC PO Take 1 tablet by mouth every other day.   Yes Historical Provider, MD  vitamin B-12 (CYANOCOBALAMIN) 1000 MCG tablet Take 1,000 mcg by mouth daily.   Yes Historical Provider, MD  vitamin C (ASCORBIC ACID) 500 MG tablet Take 1 tablet (500 mg total) by mouth  daily. 11/27/14  Yes Bradly Bienenstock, MD  acetaminophen-codeine (TYLENOL #3) 300-30 MG per tablet Take 1 tablet by mouth every 4 (four) hours as needed for moderate pain. Patient not taking: Reported on 12/28/2016 11/27/14   Bradly Bienenstock, MD  Calcium Carbonate-Vitamin D (CALCIUM + D PO) Take 2 capsules by mouth 3 (three) times daily. Takes Brand--  Bone-Up  (calcium 1200mg .  D 2000units,  Vit C)    Historical Provider, MD  Cholecalciferol (VITAMIN D3) 5000 UNITS CAPS Take 1 capsule by mouth daily.    Historical Provider, MD  ciprofloxacin (CIPRO) 500 MG tablet Take 1 tablet (500 mg total) by mouth 2 (two) times daily. One po bid x 7 days 12/28/16   Bethann Berkshire, MD  docusate sodium (COLACE) 100 MG capsule Take 1 capsule (100 mg total) by mouth 2  (two) times daily. Patient not taking: Reported on 12/28/2016 11/27/14   Bradly Bienenstock, MD  metroNIDAZOLE (FLAGYL) 500 MG tablet Take 1 tablet (500 mg total) by mouth 4 (four) times daily. 12/28/16   Bethann Berkshire, MD  ondansetron (ZOFRAN) 4 MG tablet Take 1 tablet (4 mg total) by mouth every 6 (six) hours. Patient not taking: Reported on 12/28/2016 11/22/14   Arthor Captain, PA-C  Zoledronic Acid (RECLAST IV) Inject into the vein. yearly    Historical Provider, MD    Family History No family history on file.  Social History Social History  Substance Use Topics  . Smoking status: Former Smoker    Packs/day: 1.00    Years: 30.00    Types: Cigarettes    Quit date: 01/10/1989  . Smokeless tobacco: Never Used  . Alcohol use No     Allergies   Lactose intolerance (gi); Penicillins; Sulfa antibiotics; Hydrocodone; and Reclast [zoledronic acid]   Review of Systems Review of Systems  Constitutional: Negative for appetite change, chills and fatigue.  HENT: Negative for congestion, ear discharge and sinus pressure.   Eyes: Negative for discharge.  Respiratory: Negative for cough.   Cardiovascular: Negative for chest pain.  Gastrointestinal: Positive for diarrhea. Negative for abdominal pain.  Genitourinary: Negative for frequency and hematuria.  Musculoskeletal: Negative for back pain.  Skin: Negative for rash.  Neurological: Negative for seizures and headaches.  Psychiatric/Behavioral: Negative for hallucinations.     Physical Exam Updated Vital Signs BP 151/80   Pulse 64   Temp 98.3 F (36.8 C) (Oral)   Resp 20   SpO2 97%   Physical Exam  Constitutional: She is oriented to person, place, and time. She appears well-developed.  HENT:  Head: Normocephalic.  Eyes: Conjunctivae and EOM are normal. No scleral icterus.  Neck: Neck supple. No thyromegaly present.  Cardiovascular: Normal rate and regular rhythm.  Exam reveals no gallop and no friction rub.   No murmur  heard. Pulmonary/Chest: No stridor. She has no wheezes. She has no rales. She exhibits no tenderness.  Abdominal: She exhibits no distension. There is no tenderness. There is no rebound.  Musculoskeletal: Normal range of motion. She exhibits no edema.  Lymphadenopathy:    She has no cervical adenopathy.  Neurological: She is oriented to person, place, and time. She exhibits normal muscle tone. Coordination normal.  Skin: No rash noted. No erythema.  Psychiatric: She has a normal mood and affect. Her behavior is normal.     ED Treatments / Results  Labs (all labs ordered are listed, but only abnormal results are displayed) Labs Reviewed  LIPASE, BLOOD - Abnormal; Notable for the following:  Result Value   Lipase 52 (*)    All other components within normal limits  COMPREHENSIVE METABOLIC PANEL  CBC  URINALYSIS, ROUTINE W REFLEX MICROSCOPIC    EKG  EKG Interpretation None       Radiology Ct Abdomen Pelvis W Contrast  Result Date: 12/28/2016 CLINICAL DATA:  Acute onset of diarrhea and weight loss. Generalized abdominal pain. Initial encounter. EXAM: CT ABDOMEN AND PELVIS WITH CONTRAST TECHNIQUE: Multidetector CT imaging of the abdomen and pelvis was performed using the standard protocol following bolus administration of intravenous contrast. CONTRAST:  ISOVUE-300 IOPAMIDOL (ISOVUE-300) INJECTION 61% COMPARISON:  CT of the abdomen and pelvis from 11/11/2015 FINDINGS: Lower chest: The visualized lung bases are grossly clear. The visualized portions of the mediastinum are unremarkable. Hepatobiliary: The liver is unremarkable in appearance. Mild prominence of the intrahepatic biliary ducts remains within normal limits status post cholecystectomy. Clips are noted at the gallbladder fossa. The common bile duct is within normal limits status post cholecystectomy. Pancreas: The pancreas is within normal limits. Spleen: The spleen is unremarkable in appearance. Adrenals/Urinary  Tract: The adrenal glands are unremarkable in appearance. The kidneys are within normal limits. There is no evidence of hydronephrosis. No renal or ureteral stones are identified. No perinephric stranding is seen. Stomach/Bowel: The stomach is unremarkable in appearance. The small bowel is within normal limits. The appendix is not visualized; there is no evidence for appendicitis. There is question of mild diffuse wall thickening along the sigmoid colon, which could reflect a mild infectious or inflammatory process. Vascular/Lymphatic: Scattered calcification is seen along the abdominal aorta and its branches. The abdominal aorta is otherwise grossly unremarkable. The inferior vena cava is grossly unremarkable. No retroperitoneal lymphadenopathy is seen. No pelvic sidewall lymphadenopathy is identified. Evaluation for lymphadenopathy is suboptimal due to the lack of intraperitoneal fat. Reproductive: The bladder is moderately distended and grossly unremarkable. The uterus is grossly unremarkable. No suspicious adnexal masses are seen. Other: No additional soft tissue abnormalities are seen. Musculoskeletal: No acute osseous abnormalities are identified. Vacuum phenomenon is noted at L5-S1, with mild grade 1 retrolisthesis of L5 on S1. The visualized musculature is unremarkable in appearance. IMPRESSION: 1. Question of mild diffuse wall thickening along the sigmoid colon, which could reflect a mild infectious or inflammatory process, given the patient's symptoms. 2. Scattered aortic atherosclerosis. Electronically Signed   By: Roanna Raider M.D.   On: 12/28/2016 19:37    Procedures Procedures (including critical care time)  Medications Ordered in ED Medications  sodium chloride 0.9 % bolus 1,000 mL (0 mLs Intravenous Stopped 12/28/16 1732)  iopamidol (ISOVUE-300) 61 % injection (100 mLs  Contrast Given 12/28/16 1906)     Initial Impression / Assessment and Plan / ED Course  I have reviewed the triage  vital signs and the nursing notes.  Pertinent labs & imaging results that were available during my care of the patient were reviewed by me and considered in my medical decision making (see chart for details).    Patient with persistent diarrhea. CT scan shows possible infection and colon. Patient will be put on Flagyl and Cipro will follow-up with the gastroenterologist  Final Clinical Impressions(s) / ED Diagnoses   Final diagnoses:  Colitis    New Prescriptions New Prescriptions   CIPROFLOXACIN (CIPRO) 500 MG TABLET    Take 1 tablet (500 mg total) by mouth 2 (two) times daily. One po bid x 7 days   METRONIDAZOLE (FLAGYL) 500 MG TABLET    Take 1 tablet (500  mg total) by mouth 4 (four) times daily.     Bethann BerkshireJoseph Bevan Disney, MD 12/28/16 2006

## 2016-12-28 NOTE — Discharge Instructions (Signed)
Follow-up with your stomach doctor next week for recheck

## 2016-12-29 ENCOUNTER — Other Ambulatory Visit: Payer: Self-pay | Admitting: Gastroenterology

## 2017-01-06 ENCOUNTER — Ambulatory Visit (HOSPITAL_COMMUNITY)
Admission: RE | Admit: 2017-01-06 | Discharge: 2017-01-06 | Disposition: A | Discharge status: Home / Self Care | Payer: Medicare Other | Source: Ambulatory Visit | Attending: Gastroenterology | Admitting: Gastroenterology

## 2017-01-06 ENCOUNTER — Ambulatory Visit (HOSPITAL_COMMUNITY): Payer: Medicare Other | Admitting: Anesthesiology

## 2017-01-06 ENCOUNTER — Encounter (HOSPITAL_COMMUNITY)
Admission: RE | Disposition: A | Discharge status: Home / Self Care | Payer: Self-pay | Source: Ambulatory Visit | Attending: Gastroenterology

## 2017-01-06 ENCOUNTER — Encounter (HOSPITAL_COMMUNITY): Payer: Self-pay

## 2017-01-06 DIAGNOSIS — K3189 Other diseases of stomach and duodenum: Secondary | ICD-10-CM | POA: Insufficient documentation

## 2017-01-06 DIAGNOSIS — M810 Age-related osteoporosis without current pathological fracture: Secondary | ICD-10-CM | POA: Insufficient documentation

## 2017-01-06 DIAGNOSIS — Z880 Allergy status to penicillin: Secondary | ICD-10-CM | POA: Insufficient documentation

## 2017-01-06 DIAGNOSIS — I341 Nonrheumatic mitral (valve) prolapse: Secondary | ICD-10-CM | POA: Diagnosis not present

## 2017-01-06 DIAGNOSIS — R490 Dysphonia: Secondary | ICD-10-CM | POA: Diagnosis not present

## 2017-01-06 DIAGNOSIS — M436 Torticollis: Secondary | ICD-10-CM | POA: Insufficient documentation

## 2017-01-06 DIAGNOSIS — R635 Abnormal weight gain: Secondary | ICD-10-CM | POA: Diagnosis not present

## 2017-01-06 DIAGNOSIS — K6389 Other specified diseases of intestine: Secondary | ICD-10-CM | POA: Insufficient documentation

## 2017-01-06 DIAGNOSIS — K580 Irritable bowel syndrome with diarrhea: Secondary | ICD-10-CM | POA: Diagnosis not present

## 2017-01-06 DIAGNOSIS — Z87891 Personal history of nicotine dependence: Secondary | ICD-10-CM | POA: Diagnosis not present

## 2017-01-06 DIAGNOSIS — M5030 Other cervical disc degeneration, unspecified cervical region: Secondary | ICD-10-CM | POA: Diagnosis not present

## 2017-01-06 HISTORY — PX: COLONOSCOPY WITH PROPOFOL: SHX5780

## 2017-01-06 HISTORY — PX: ESOPHAGOGASTRODUODENOSCOPY (EGD) WITH PROPOFOL: SHX5813

## 2017-01-06 SURGERY — ESOPHAGOGASTRODUODENOSCOPY (EGD) WITH PROPOFOL
Anesthesia: Monitor Anesthesia Care

## 2017-01-06 MED ORDER — PROPOFOL 10 MG/ML IV BOLUS
INTRAVENOUS | Status: AC
  Filled 2017-01-06: qty 40

## 2017-01-06 MED ORDER — SODIUM CHLORIDE 0.9 % IV SOLN: INTRAVENOUS | Status: DC

## 2017-01-06 MED ORDER — PROPOFOL 10 MG/ML IV BOLUS
INTRAVENOUS | Status: DC | PRN
  Administered 2017-01-06: 50 mg via INTRAVENOUS

## 2017-01-06 MED ORDER — LACTATED RINGERS IV SOLN
INTRAVENOUS | Status: DC
  Administered 2017-01-06: 13:00:00 via INTRAVENOUS

## 2017-01-06 MED ORDER — LIDOCAINE 2% (20 MG/ML) 5 ML SYRINGE
INTRAMUSCULAR | Status: DC | PRN
  Administered 2017-01-06: 80 mg via INTRAVENOUS

## 2017-01-06 MED ORDER — PROPOFOL 500 MG/50ML IV EMUL
INTRAVENOUS | Status: DC | PRN
  Administered 2017-01-06: 100 ug/kg/min via INTRAVENOUS

## 2017-01-06 SURGICAL SUPPLY — 25 items

## 2017-01-06 NOTE — H&P (Signed)
Dana [name] HPI: This is a 71 year old female with a PMH of chronic diarrhea with an extensive work up in the past.  There was no overt pathology, but she was tried emperically on budesonide to help resolve the situation.  No benefit with the medication for weak evidence of microscopic colitis. A referral to [facility] was made, but she did not follow up with the appointment.  She spontaneously improved over time, but lately her symptoms markedly worsened.  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  Also, Creon was provided without any benefit.    Past Medical History:  Diagnosis Date  . Cervical dystonia    TREATED BY NEUROLOGIST AT [facility]--  BOTOX INJECTIONS  . Closed fracture of left distal radius   . Colitis   . DDD (degenerative disc disease), cervical   . History of pericarditis    1989  . IBS (irritable bowel syndrome)   . MVP (mitral valve prolapse)   . Osteoporosis   . Spondylosis of cervical spine   . Torticollis, spasmodic   . Urinary incontinence   . Wears glasses     Past Surgical History:  Procedure Laterality Date  . CARDIAC CATHETERIZATION  04-05-2000  dr al little   normal LVF/  ef 60%/  no coronary disaease/  MVP with mild MR  . CLOSED REDUCTION RADIAL SHAFT Left 11/27/2014   Procedure: CLOSED REDUCTION DISTAL RADIUS WITH PINS;  Surgeon: [name], MD;  Location: [facility];  Service: Orthopedics;  Laterality: Left;  . COLONOSCOPY W/ POLYPECTOMY  03-30-2001  . DILATION AND CURETTAGE OF UTERUS  yrs ago  . LAPAROSCOPIC CHOLECYSTECTOMY  04-27-2000  . ROTATOR CUFF REPAIR Right 11/ 2000    History reviewed. No pertinent family history.  Social History:  reports that she quit smoking about 28 years ago. Her smoking use included Cigarettes. She has a 30.00 pack-year smoking history. She has never used smokeless tobacco. She reports that she does not drink alcohol or use  drugs.  Allergies:  Allergies  Allergen Reactions  . Lactose Intolerance (Gi) Diarrhea  . Penicillins Rash    Has patient had a PCN reaction causing immediate rash, facial/tongue/throat swelling, SOB or lightheadedness with hypotension:YES Has patient had a PCN reaction causing severe rash involving mucus membranes or skin necrosis: NO Has patient had a PCN reaction that required hospitalization NO Has patient had a PCN reaction occurring within the last 10 years: NO If all of the above answers are "NO", then may proceed with Cephalosporin use.  . Sulfa Antibiotics Rash  . Hydrocodone Itching  . Reclast [Zoledronic Acid]     Rash     Medications:  Scheduled:  Continuous: . sodium chloride    . lactated ringers 20 mL/hr at 01/06/17 1304    No results found for this or any previous visit (from the past 24 hour(s)).   No results found.  ROS:  As stated above in the HPI otherwise negative.  Blood pressure (!) 167/89, pulse 70, temperature 98 F (36.7 C), temperature source Oral, resp. rate 12, height 5\' 4"  (1.626 m), weight 39 kg (86 lb), SpO2 100 %.    PE: Gen: NAD, Alert and Oriented HEENT:  [place]/AT, EOMI Neck: Supple, no LAD Lungs: CTA Bilaterally CV: RRR without M/G/R ABM: Soft, NTND, +BS Ext: No C/C/E  Assessment/Plan: 1) Diarrhea. 2) Weight loss.  Plan: 1) EGD/Colonoscopy with biopsy.  Ariez Neilan D 01/06/2017,  1:38 PM

## 2017-01-06 NOTE — Op Note (Signed)
[facility] Patient Name: Dana [name] Procedure Date: 01/06/2017 MRN: [id number] Attending MD: Dana [name] , MD Date of Birth: June 15, 1946 CSN: [id number] Age: 71 Admit Type: Inpatient Procedure:                Upper GI endoscopy Indications:              Diarrhea, Weight loss Providers:                Dana Hawking, MD, Omelia Blackwater RN, RN, Janie                            [place], Technician, [facility], CRNA Referring MD:              Medicines:                Propofol per Anesthesia Complications:            No immediate complications. Estimated Blood Loss:     Estimated blood loss was minimal. Procedure:                Pre-Anesthesia Assessment:                           - Prior to the procedure, a History and Physical                            was performed, and patient medications and                            allergies were reviewed. The patient's tolerance of                            previous anesthesia was also reviewed. The risks                            and benefits of the procedure and the sedation                            options and risks were discussed with the patient.                            All questions were answered, and informed consent                            was obtained. Prior Anticoagulants: The patient has                            taken no previous anticoagulant or antiplatelet                            agents. ASA Grade Assessment: II - A patient with                            mild systemic disease. After reviewing the risks  and benefits, the patient was deemed in                            satisfactory condition to undergo the procedure.                           - Sedation was administered by an anesthesia                            professional. Deep sedation was attained.                           After obtaining informed consent, the endoscope was   passed under direct vision. Throughout the                            procedure, the patient's blood pressure, pulse, and                            oxygen saturations were monitored continuously. The                            Endoscope was introduced through the mouth, and                            advanced to the third part of duodenum. The upper                            GI endoscopy was accomplished without difficulty.                            The patient tolerated the procedure well. Scope In: Scope Out: Findings:      The esophagus was normal.      The stomach was normal.      The examined duodenum was normal. Biopsies were taken with a cold       forceps for histology. Impression:               - Normal esophagus.                           - Normal stomach.                           - Normal examined duodenum. Biopsied. Moderate Sedation:      N/A- Per Anesthesia Care Recommendation:           - Patient has a contact number available for                            emergencies. The signs and symptoms of potential                            delayed complications were discussed with the                            patient.  Return to normal activities tomorrow.                            Written discharge instructions were provided to the                            patient.                           - Resume previous diet.                           - Continue present medications.                           - Await pathology results. Procedure Code(s):        --- Professional ---                           [phone], Esophagogastroduodenoscopy, flexible,                            transoral; with biopsy, single or multiple Diagnosis Code(s):        --- Professional ---                           R19.7, Diarrhea, unspecified                           R63.4, Abnormal weight loss CPT copyright 2016 American Medical Association. All rights reserved. The codes documented in this report  are preliminary and upon coder review may  be revised to meet current compliance requirements. Dana HawkingPatrick Harmani Neto, MD Dana HawkingPatrick Elka Satterfield, MD 01/06/2017 2:23:51 PM This report has been signed electronically. Number of Addenda: 0

## 2017-01-06 NOTE — Discharge Instructions (Signed)

## 2017-01-06 NOTE — Anesthesia Preprocedure Evaluation (Addendum)
Anesthesia Evaluation  Patient identified by MRN, date of birth, ID band Patient awake    Reviewed: Allergy & Precautions, NPO status , Patient's Chart, lab work & pertinent test results  Airway Mallampati: II  TM Distance: >3 FB Neck ROM: Full    Dental no notable dental hx.    Pulmonary neg pulmonary ROS, former smoker,    Pulmonary exam normal breath sounds clear to auscultation       Cardiovascular negative cardio ROS Normal cardiovascular exam Rhythm:Regular Rate:Normal     Neuro/Psych dysphonia  Neuromuscular disease negative psych ROS   GI/Hepatic negative GI ROS, Neg liver ROS,   Endo/Other  negative endocrine ROS  Renal/GU negative Renal ROS  negative genitourinary   Musculoskeletal negative musculoskeletal ROS (+)   Abdominal   Peds negative pediatric ROS (+)  Hematology negative hematology ROS (+)   Anesthesia Other Findings   Reproductive/Obstetrics negative OB ROS                            Anesthesia Physical Anesthesia Plan  ASA: II  Anesthesia Plan: MAC   Post-op Pain Management:    Induction: Intravenous  Airway Management Planned: Nasal Cannula  Additional Equipment:   Intra-op Plan:   Post-operative Plan:   Informed Consent: I have reviewed the patients History and Physical, chart, labs and discussed the procedure including the risks, benefits and alternatives for the proposed anesthesia with the patient or authorized representative who has indicated his/her understanding and acceptance.   Dental advisory given  Plan Discussed with: CRNA and Surgeon  Anesthesia Plan Comments:         Anesthesia Quick Evaluation

## 2017-01-06 NOTE — Op Note (Signed)
[facility] Patient Name: Dana BrownsShirley [name] Procedure Date: 01/06/2017 MRN: [id number] Attending MD: Jeani HawkingPatrick Steaven Wholey , MD Date of Birth: 05/12/1946 CSN: [id number] Age: 71 Admit Type: Inpatient Procedure:                Colonoscopy Indications:              Diarrhea Providers:                Jeani HawkingPatrick Saliou Barnier, MD, Omelia BlackwaterShelby Carpenter RN, RN, Beryle BeamsJanie                            Billups, Technician, [facility] Williford, CRNA Referring MD:              Medicines:                Propofol per Anesthesia Complications:            No immediate complications. Estimated Blood Loss:     Estimated blood loss was minimal. Procedure:                Pre-Anesthesia Assessment:                           - Prior to the procedure, a History and Physical                            was performed, and patient medications and                            allergies were reviewed. The patient's tolerance of                            previous anesthesia was also reviewed. The risks                            and benefits of the procedure and the sedation                            options and risks were discussed with the patient.                            All questions were answered, and informed consent                            was obtained. Prior Anticoagulants: The patient has                            taken no previous anticoagulant or antiplatelet                            agents. ASA Grade Assessment: II - A patient with                            mild systemic disease. After reviewing the risks  and benefits, the patient was deemed in                            satisfactory condition to undergo the procedure.                           - Sedation was administered by an anesthesia                            professional. Deep sedation was attained.                           After obtaining informed consent, the colonoscope                            was passed under direct  vision. Throughout the                            procedure, the patient's blood pressure, pulse, and                            oxygen saturations were monitored continuously. The                            EC-[id number] ([id number]) scope was introduced through                            the anus and advanced to the the cecum, identified                            by appendiceal orifice and ileocecal valve. The                            colonoscopy was somewhat difficult due to                            significant looping. Successful completion of the                            procedure was aided by applying abdominal pressure.                            The patient tolerated the procedure well. The                            quality of the bowel preparation was excellent. The                            ileocecal valve, appendiceal orifice, and rectum                            were photographed. Scope In: 1:54:31 PM Scope Out: 2:14:21 PM Scope Withdrawal Time: 0 hours 9 minutes 50 seconds  Total Procedure Duration: 0 hours 19 minutes 50 seconds  Findings:      Multiple small patchy angiodysplastic lesions without bleeding were       found in the ascending colon and in the cecum.      Normal mucosa was found in the entire colon. Biopsies for histology were       taken with a cold forceps from the entire colon for evaluation of       microscopic colitis. Impression:               - Multiple non-bleeding colonic angiodysplastic                            lesions.                           - Normal mucosa in the entire examined colon.                            Biopsied. Moderate Sedation:      N/A- Per Anesthesia Care Recommendation:           - Patient has a contact number available for                            emergencies. The signs and symptoms of potential                            delayed complications were discussed with the                            patient. Return to normal  activities tomorrow.                            Written discharge instructions were provided to the                            patient.                           - Resume previous diet.                           - Continue present medications.                           - Await pathology results.                           - No repeat colonoscopy due to age and the absence                            of advanced adenomas. Procedure Code(s):        --- Professional ---                           [phone], Colonoscopy, flexible; with biopsy, single  or multiple Diagnosis Code(s):        --- Professional ---                           K55.20, Angiodysplasia of colon without hemorrhage                           R19.7, Diarrhea, unspecified CPT copyright 2016 American Medical Association. All rights reserved. The codes documented in this report are preliminary and upon coder review may  be revised to meet current compliance requirements. Jeani Hawking, MD Jeani Hawking, MD 01/06/2017 2:21:56 PM This report has been signed electronically. Number of Addenda: 0

## 2017-01-06 NOTE — Transfer of Care (Signed)
Immediate Anesthesia Transfer of Care Note  Patient: Dana [name]  Procedure(s) Performed: Procedure(s): ESOPHAGOGASTRODUODENOSCOPY (EGD) WITH PROPOFOL (N/A) COLONOSCOPY WITH PROPOFOL (N/A)  Patient Location: PACU  Anesthesia Type:MAC  Level of Consciousness: awake, alert  and oriented  Airway & Oxygen Therapy: Patient Spontanous Breathing and Patient connected to nasal cannula oxygen  Post-op Assessment: Report given to RN and Post -op Vital signs reviewed and stable  Post vital signs: Reviewed and stable  Last Vitals:  Vitals:   01/06/17 1253 01/06/17 1425  BP: (!) 167/89   Pulse: 70 62  Resp: 12 14  Temp: 36.7 C     Last Pain:  Vitals:   01/06/17 1253  TempSrc: Oral         Complications: No apparent anesthesia complications

## 2017-01-09 ENCOUNTER — Encounter (HOSPITAL_COMMUNITY): Payer: Self-pay | Admitting: Gastroenterology

## 2017-01-09 NOTE — Anesthesia Postprocedure Evaluation (Signed)
Anesthesia Post Note  Patient: Tameia Rafferty Lartigue  Procedure(s) Performed: Procedure(s) (LRB): ESOPHAGOGASTRODUODENOSCOPY (EGD) WITH PROPOFOL (N/A) COLONOSCOPY WITH PROPOFOL (N/A)  Patient location during evaluation: PACU Anesthesia Type: MAC Level of consciousness: awake and alert Pain management: pain level controlled Vital Signs Assessment: post-procedure vital signs reviewed and stable Respiratory status: spontaneous breathing, nonlabored ventilation, respiratory function stable and patient connected to nasal cannula oxygen Cardiovascular status: stable and blood pressure returned to baseline Anesthetic complications: no       Last Vitals:  Vitals:   01/06/17 1450 01/06/17 1455  BP: (!) 161/68   Pulse: 62 64  Resp: 15 13  Temp:      Last Pain:  Vitals:   01/06/17 1428  TempSrc: Oral                 Haylo Fake S

## 2017-04-10 NOTE — Anesthesia Postprocedure Evaluation (Signed)
Anesthesia Post Note  Patient: Dana [name]  Procedure(s) Performed: Procedure(s) (LRB): ESOPHAGOGASTRODUODENOSCOPY (EGD) WITH PROPOFOL (N/A) COLONOSCOPY WITH PROPOFOL (N/A)     Anesthesia Post Evaluation  Last Vitals:  Vitals:   01/06/17 1450 01/06/17 1455  BP: (!) 161/68   Pulse: 62 64  Resp: 15 13  Temp:      Last Pain:  Vitals:   01/06/17 1428  TempSrc: Oral                 [name] S

## 2017-04-10 NOTE — Addendum Note (Signed)
Addendum  created 04/10/17 1150 by Aulton Routt, MD   Sign clinical note    

## 2017-11-21 ENCOUNTER — Ambulatory Visit: Payer: Self-pay | Admitting: Internal Medicine

## 2017-11-21 DIAGNOSIS — Z0289 Encounter for other administrative examinations: Secondary | ICD-10-CM

## 2018-04-12 ENCOUNTER — Other Ambulatory Visit: Payer: Self-pay | Admitting: Otolaryngology

## 2018-04-16 NOTE — Pre-Procedure Instructions (Signed)
Dana [name]  04/16/2018       Your procedure is scheduled on April 20, 2018.  [facility] at 06:20 A.M.  Call this number if you have problems the morning of surgery:  [phone]   Remember:  No food or liquids after midnight after midnight.  Continue all other medications as directed by your physician except for following these medication instructions before surgery.     Take these medicines the morning of surgery with A SIP OF WATER : Bupropion (Wellbutrin XL) Clonazepam (Klonopin) Escitalopram (Lexapro)    Do not wear jewelry, make-up or nail polish.  Do not wear lotions, powders, or perfumes, or deodorant.  Do not shave 48 hours prior to surgery.   Do not bring valuables to the hospital.   [facility] is not responsible for any belongings or valuables.  Contacts, dentures or bridgework may not be worn into surgery.  Leave your suitcase in the car.  After surgery it may be brought to your room.  For patients admitted to the hospital, discharge time will be determined by your treatment team.  Patients discharged the day of surgery will not be allowed to drive home.   Special instructions:   [facility]- Preparing For Surgery  Before surgery, you can play an important role. Because skin is not sterile, your skin needs to be as free of germs as possible. You can reduce the number of germs on your skin by washing with CHG (chlorahexidine gluconate) Soap before surgery.  CHG is an antiseptic cleaner which kills germs and bonds with the skin to continue killing germs even after washing.    Oral Hygiene is also important to reduce your risk of infection.  Remember - BRUSH YOUR TEETH THE MORNING OF SURGERY WITH YOUR REGULAR TOOTHPASTE  Please do not use if you have an allergy to CHG or antibacterial soaps. If your skin becomes reddened/irritated stop using the CHG.  Do not shave (including legs and underarms) for at least 48 hours prior to  first CHG shower. It is OK to shave your face.  Please follow these instructions carefully.   1. Shower the NIGHT BEFORE SURGERY and the MORNING OF SURGERY with CHG.   2. If you chose to wash your hair, wash your hair first as usual with your normal shampoo.  3. After you shampoo, rinse your hair and body thoroughly to remove the shampoo.  4. Use CHG as you would any other liquid soap. You can apply CHG directly to the skin and wash gently with a scrungie or a clean washcloth.   5. Apply the CHG Soap to your body ONLY FROM THE NECK DOWN.  Do not use on open wounds or open sores. Avoid contact with your eyes, ears, mouth and genitals (private parts). Wash Face and genitals (private parts)  with your normal soap.  6. Wash thoroughly, paying special attention to the area where your surgery will be performed.  7. Thoroughly rinse your body with warm water from the neck down.  8. DO NOT shower/wash with your normal soap after using and rinsing off the CHG Soap.  9. Pat yourself dry with a CLEAN TOWEL.  10. Wear CLEAN PAJAMAS to bed the night before surgery, wear comfortable clothes the morning of surgery  11. Place CLEAN SHEETS on your bed the night of your first shower and DO NOT SLEEP WITH PETS.    Day of Surgery:  Do not apply any deodorants/lotions.  Please wear clean clothes to the hospital/surgery center.   Remember to brush your teeth WITH YOUR REGULAR TOOTHPASTE.    Please read over the following fact sheets that you were given.

## 2018-04-17 ENCOUNTER — Other Ambulatory Visit: Payer: Self-pay

## 2018-04-17 ENCOUNTER — Encounter (HOSPITAL_COMMUNITY)
Admission: RE | Admit: 2018-04-17 | Discharge: 2018-04-17 | Disposition: A | Discharge status: Home / Self Care | Payer: Medicare Other | Source: Ambulatory Visit | Attending: Otolaryngology | Admitting: Otolaryngology

## 2018-04-17 ENCOUNTER — Encounter (HOSPITAL_COMMUNITY): Payer: Self-pay

## 2018-04-17 DIAGNOSIS — R32 Unspecified urinary incontinence: Secondary | ICD-10-CM | POA: Diagnosis not present

## 2018-04-17 DIAGNOSIS — R229 Localized swelling, mass and lump, unspecified: Secondary | ICD-10-CM | POA: Diagnosis present

## 2018-04-17 DIAGNOSIS — E739 Lactose intolerance, unspecified: Secondary | ICD-10-CM | POA: Diagnosis not present

## 2018-04-17 DIAGNOSIS — Z9842 Cataract extraction status, left eye: Secondary | ICD-10-CM | POA: Diagnosis not present

## 2018-04-17 DIAGNOSIS — I341 Nonrheumatic mitral (valve) prolapse: Secondary | ICD-10-CM | POA: Diagnosis not present

## 2018-04-17 DIAGNOSIS — Z885 Allergy status to narcotic agent status: Secondary | ICD-10-CM | POA: Diagnosis not present

## 2018-04-17 DIAGNOSIS — Z01810 Encounter for preprocedural cardiovascular examination: Secondary | ICD-10-CM | POA: Insufficient documentation

## 2018-04-17 DIAGNOSIS — H61002 Unspecified perichondritis of left external ear: Secondary | ICD-10-CM | POA: Diagnosis not present

## 2018-04-17 DIAGNOSIS — K589 Irritable bowel syndrome without diarrhea: Secondary | ICD-10-CM | POA: Diagnosis not present

## 2018-04-17 DIAGNOSIS — Z01812 Encounter for preprocedural laboratory examination: Secondary | ICD-10-CM

## 2018-04-17 DIAGNOSIS — F329 Major depressive disorder, single episode, unspecified: Secondary | ICD-10-CM | POA: Diagnosis not present

## 2018-04-17 DIAGNOSIS — L98491 Non-pressure chronic ulcer of skin of other sites limited to breakdown of skin: Secondary | ICD-10-CM | POA: Diagnosis not present

## 2018-04-17 DIAGNOSIS — M810 Age-related osteoporosis without current pathological fracture: Secondary | ICD-10-CM | POA: Diagnosis not present

## 2018-04-17 DIAGNOSIS — Z9049 Acquired absence of other specified parts of digestive tract: Secondary | ICD-10-CM | POA: Diagnosis not present

## 2018-04-17 DIAGNOSIS — G243 Spasmodic torticollis: Secondary | ICD-10-CM | POA: Diagnosis not present

## 2018-04-17 DIAGNOSIS — Z79899 Other long term (current) drug therapy: Secondary | ICD-10-CM | POA: Diagnosis not present

## 2018-04-17 DIAGNOSIS — Z9841 Cataract extraction status, right eye: Secondary | ICD-10-CM | POA: Diagnosis not present

## 2018-04-17 DIAGNOSIS — F419 Anxiety disorder, unspecified: Secondary | ICD-10-CM | POA: Diagnosis not present

## 2018-04-17 DIAGNOSIS — Z87891 Personal history of nicotine dependence: Secondary | ICD-10-CM | POA: Diagnosis not present

## 2018-04-17 DIAGNOSIS — Z880 Allergy status to penicillin: Secondary | ICD-10-CM | POA: Diagnosis not present

## 2018-04-17 DIAGNOSIS — M5030 Other cervical disc degeneration, unspecified cervical region: Secondary | ICD-10-CM | POA: Diagnosis not present

## 2018-04-17 DIAGNOSIS — Z882 Allergy status to sulfonamides status: Secondary | ICD-10-CM | POA: Diagnosis not present

## 2018-04-17 DIAGNOSIS — Z888 Allergy status to other drugs, medicaments and biological substances status: Secondary | ICD-10-CM | POA: Diagnosis not present

## 2018-04-17 HISTORY — DX: Depression, unspecified: F32.A

## 2018-04-17 HISTORY — DX: Major depressive disorder, single episode, unspecified: F32.9

## 2018-04-17 HISTORY — DX: Anxiety disorder, unspecified: F41.9

## 2018-04-17 LAB — CBC
HEMATOCRIT: 38.5 % (ref 36.0–46.0)
Hemoglobin: 12.7 g/dL (ref 12.0–15.0)
MCH: 32.6 pg (ref 26.0–34.0)
MCHC: 33 g/dL (ref 30.0–36.0)
MCV: 98.7 fL (ref 78.0–100.0)
Platelets: 289 10*3/uL (ref 150–400)
RBC: 3.9 MIL/uL (ref 3.87–5.11)
RDW: 13.4 % (ref 11.5–15.5)
WBC: 6.5 10*3/uL (ref 4.0–10.5)

## 2018-04-17 LAB — BASIC METABOLIC PANEL
Anion gap: 5 (ref 5–15)
BUN: 9 mg/dL (ref 6–20)
CHLORIDE: 100 mmol/L — AB (ref 101–111)
CO2: 32 mmol/L (ref 22–32)
CREATININE: 0.47 mg/dL (ref 0.44–1.00)
Calcium: 9.2 mg/dL (ref 8.9–10.3)
GFR calc Af Amer: >60 mL/min (ref >60)
GFR calc non Af Amer: >60 mL/min (ref >60)
GLUCOSE: 74 mg/dL (ref 65–99)
POTASSIUM: 3.9 mmol/L (ref 3.5–5.1)
Sodium: 137 mmol/L (ref 135–145)

## 2018-04-17 NOTE — Progress Notes (Signed)
PCP - Dr. Norma FredricksonFarr Cardiologist - patient denies  Chest x-ray - n/a EKG - 04/17/2018 Stress Test - patient denies ECHO - patient states it was many years ago when she was around [age] yrs of age. Cardiac Cath - 2012  Sleep Study - patient denies  Blood Thinner Instructions: n/a Aspirin Instructions: n/a  Anesthesia review: yes, hx of MVP  Patient states she was diagnosed with MVP in early to mid 50's.  She states it was thought to be caused by pericarditis.  Patient is no longer followed by a cardiologist and because of the time, she cannot recall who she was seen by in the past.  Patient denies shortness of breath, fever, cough and chest pain at PAT appointment   Patient verbalized understanding of instructions that were given to them at the PAT appointment. Patient was also instructed that they will need to review over the PAT instructions again at home before surgery.

## 2018-04-17 NOTE — Pre-Procedure Instructions (Signed)
Dana [name]  04/17/2018       Your procedure is scheduled on April 20, 2018.  [facility] at 06:20 A.M.  Call this number if you have problems the morning of surgery:  [phone]   Remember:  No food or liquids after midnight after midnight.  Continue all other medications as directed by your physician except for following these medication instructions before surgery.     Take these medicines the morning of surgery with A SIP OF WATER : Bupropion (Wellbutrin XL) Clonazepam (Klonopin) Escitalopram (Lexapro)   7 days prior to surgery STOP taking any Aspirin (unless otherwise instructed by your surgeon), Aleve, Naproxen, Ibuprofen, Motrin, Advil, Goody's, BC's, all herbal medications, fish oil, and all vitamins   Do not wear jewelry, make-up or nail polish.  Do not wear lotions, powders, or perfumes, or deodorant.  Do not shave 48 hours prior to surgery.   Do not bring valuables to the hospital.   [facility] is not responsible for any belongings or valuables.  Contacts, dentures or bridgework may not be worn into surgery.  Leave your suitcase in the car.  After surgery it may be brought to your room.  For patients admitted to the hospital, discharge time will be determined by your treatment team.  Patients discharged the day of surgery will not be allowed to drive home.   Special instructions:   [place]- Preparing For Surgery  Before surgery, you can play an important role. Because skin is not sterile, your skin needs to be as free of germs as possible. You can reduce the number of germs on your skin by washing with CHG (chlorahexidine gluconate) Soap before surgery.  CHG is an antiseptic cleaner which kills germs and bonds with the skin to continue killing germs even after washing.    Oral Hygiene is also important to reduce your risk of infection.  Remember - BRUSH YOUR TEETH THE MORNING OF SURGERY WITH YOUR REGULAR  TOOTHPASTE  Please do not use if you have an allergy to CHG or antibacterial soaps. If your skin becomes reddened/irritated stop using the CHG.  Do not shave (including legs and underarms) for at least 48 hours prior to first CHG shower. It is OK to shave your face.  Please follow these instructions carefully.   1. Shower the NIGHT BEFORE SURGERY and the MORNING OF SURGERY with CHG.   2. If you chose to wash your hair, wash your hair first as usual with your normal shampoo.  3. After you shampoo, rinse your hair and body thoroughly to remove the shampoo.  4. Use CHG as you would any other liquid soap. You can apply CHG directly to the skin and wash gently with a scrungie or a clean washcloth.   5. Apply the CHG Soap to your body ONLY FROM THE NECK DOWN.  Do not use on open wounds or open sores. Avoid contact with your eyes, ears, mouth and genitals (private parts). Wash Face and genitals (private parts)  with your normal soap.  6. Wash thoroughly, paying special attention to the area where your surgery will be performed.  7. Thoroughly rinse your body with warm water from the neck down.  8. DO NOT shower/wash with your normal soap after using and rinsing off the CHG Soap.  9. Pat yourself dry with a CLEAN TOWEL.  10. Wear CLEAN PAJAMAS to bed the night before surgery, wear comfortable clothes the morning of surgery  11. Place CLEAN SHEETS on your bed the night of your first shower and DO NOT SLEEP WITH PETS.    Day of Surgery:  Do not apply any deodorants/lotions.  Please wear clean clothes to the hospital/surgery center.   Remember to brush your teeth WITH YOUR REGULAR TOOTHPASTE.    Please read over the following fact sheets that you were given.

## 2018-04-18 NOTE — Progress Notes (Signed)
Anesthesia Chart Review:   Case:  [id number] Date/Time:  04/20/18 0805   Procedure:  EXCISION PREAURICULAR CYST PEDIATRIC (Left ) - Also [id number]   Anesthesia type:  General   Pre-op diagnosis:  Chrondodermatitis nodularis helicis of left ear   Location:  [facility] ROOM 09 / [facility] OR   Surgeon:  Osborn CohoShoemaker, David, MD      DISCUSSION: - Pt is a 72 year old female with hx MVP (identified ~ 20 years ago, no issues, does not see cardiology). Normal coronaries by 2001 cath   VS: BP (!) 157/81   Pulse 68   Temp 36.8 C   Resp 18   Ht 5\' 4"  (1.626 m)   Wt 81 lb 8 oz (37 kg)   SpO2 97%   BMI 13.99 kg/m    PROVIDERS: PCP is Merri BrunettePharr, Walter, MD   LABS: Labs reviewed: Acceptable for surgery. (all labs ordered are listed, but only abnormal results are displayed)  Labs Reviewed  BASIC METABOLIC PANEL - Abnormal; Notable for the following components:      Result Value   Chloride 100 (*)    All other components within normal limits  CBC    EKG 04/17/18: NSR with sinus arrhythmia  CV:   Cardiac cath 04/05/00:  - CORONARY ARTERIOGRAPHY:  There was no calcification noted on fluoroscopy. 1. LM:  Normal. 2. LAD:  Gave rise to a diagonal, with two small branches.  The LAD itself crossed the apex, but the heart was free of disease as were the diagonals. 3. CX:  Had two large OM vessels, all were free of disease. 4. RCA:  Gave rise to a PDA only.  This system was free of disease.  There was minor dampening as the catheter was engaged, but cusp injection shows the ostium of the RCA to be normal. - CONCLUSION: 1. Normal left ventricular systolic function. 2. Mitral valve prolapse, with mild mitral regurgitation. 3. No coronary disease.   Past Medical History:  Diagnosis Date  . Anxiety   . Cervical dystonia    TREATED BY NEUROLOGIST AT [facility]--  BOTOX INJECTIONS  . Closed fracture of left distal radius   . Colitis   . DDD (degenerative disc disease), cervical   . Depression   . History of  pericarditis    1989  . IBS (irritable bowel syndrome)   . MVP (mitral valve prolapse)   . Osteoporosis   . Spondylosis of cervical spine   . Torticollis, spasmodic   . Urinary incontinence   . Wears glasses     Past Surgical History:  Procedure Laterality Date  . CARDIAC CATHETERIZATION  04-05-2000  dr al little   normal LVF/  ef 60%/  no coronary disaease/  MVP with mild MR  . CATARACT EXTRACTION W/ INTRAOCULAR LENS  IMPLANT, BILATERAL Bilateral   . CLOSED REDUCTION RADIAL SHAFT Left 11/27/2014   Procedure: CLOSED REDUCTION DISTAL RADIUS WITH PINS;  Surgeon: Sharma CovertFred W Ortmann, MD;  [facility];  Service: Orthopedics;  Laterality: Left;  . COLONOSCOPY W/ POLYPECTOMY  03-30-2001  . COLONOSCOPY WITH PROPOFOL N/A 01/06/2017   Procedure: COLONOSCOPY WITH PROPOFOL;  Surgeon: Jeani HawkingPatrick Hung, MD;  Location: [facility];  Service: Endoscopy;  Laterality: N/A;  . DILATION AND CURETTAGE OF UTERUS  yrs ago  . ESOPHAGOGASTRODUODENOSCOPY (EGD) WITH PROPOFOL N/A 01/06/2017   Procedure: ESOPHAGOGASTRODUODENOSCOPY (EGD) WITH PROPOFOL;  Surgeon: Jeani HawkingPatrick Hung, MD;  Location: [facility];  Service: Endoscopy;  Laterality: N/A;  . LAPAROSCOPIC CHOLECYSTECTOMY  04-27-2000  . ROTATOR CUFF REPAIR Right 11/ 2000    MEDICATIONS: . Ascorbic Acid (VITAMIN C) 1000 MG tablet  . bismuth subsalicylate (PEPTO BISMOL) 262 MG/15ML suspension  . buPROPion (WELLBUTRIN XL) 150 MG 24 hr tablet  . Cholecalciferol (VITAMIN D3) 2000 units TABS  . cholestyramine (QUESTRAN) 4 g packet  . clonazePAM (KLONOPIN) 1 MG tablet  . COLLAGEN PO  . escitalopram (LEXAPRO) 20 MG tablet  . Ginger, Zingiber officinalis, (GINGER ROOT) 550 MG CAPS  . Glucosamine-Chondroitin (COSAMIN DS PO)  . Krill Oil 500 MG CAPS  . modafinil (PROVIGIL) 200 MG tablet  . Multiple Vitamin (MULTIVITAMIN WITH MINERALS) TABS tablet  . OnabotulinumtoxinA (BOTOX IJ)  . Polyethyl Glycol-Propyl Glycol (SYSTANE) 0.4-0.3 % SOLN  .  RESVERATROL PO  . Specialty Vitamins Products (BRAIN) TABS  . Turmeric 500 MG CAPS  . vitamin B-12 (CYANOCOBALAMIN) 1000 MCG tablet   No current facility-administered medications for this encounter.     If no changes, I anticipate pt can proceed with surgery as scheduled.   Rica Mast, FNP-BC [facility] Short Stay Surgical Center/Anesthesiology Phone: [phone] 04/18/2018 10:22 AM

## 2018-04-20 ENCOUNTER — Other Ambulatory Visit: Payer: Self-pay

## 2018-04-20 ENCOUNTER — Encounter (HOSPITAL_COMMUNITY): Payer: Self-pay | Admitting: Surgery

## 2018-04-20 ENCOUNTER — Ambulatory Visit (HOSPITAL_COMMUNITY)
Admission: RE | Admit: 2018-04-20 | Discharge: 2018-04-20 | Disposition: A | Discharge status: Home / Self Care | Payer: Medicare Other | Source: Ambulatory Visit | Attending: Otolaryngology | Admitting: Otolaryngology

## 2018-04-20 ENCOUNTER — Ambulatory Visit (HOSPITAL_COMMUNITY): Payer: Medicare Other | Admitting: Emergency Medicine

## 2018-04-20 ENCOUNTER — Encounter (HOSPITAL_COMMUNITY)
Admission: RE | Disposition: A | Discharge status: Home / Self Care | Payer: Self-pay | Source: Ambulatory Visit | Attending: Otolaryngology

## 2018-04-20 ENCOUNTER — Ambulatory Visit (HOSPITAL_COMMUNITY): Payer: Medicare Other | Admitting: Critical Care Medicine

## 2018-04-20 DIAGNOSIS — Z885 Allergy status to narcotic agent status: Secondary | ICD-10-CM | POA: Insufficient documentation

## 2018-04-20 DIAGNOSIS — L98491 Non-pressure chronic ulcer of skin of other sites limited to breakdown of skin: Secondary | ICD-10-CM | POA: Insufficient documentation

## 2018-04-20 DIAGNOSIS — Z79899 Other long term (current) drug therapy: Secondary | ICD-10-CM | POA: Insufficient documentation

## 2018-04-20 DIAGNOSIS — K589 Irritable bowel syndrome without diarrhea: Secondary | ICD-10-CM | POA: Insufficient documentation

## 2018-04-20 DIAGNOSIS — G243 Spasmodic torticollis: Secondary | ICD-10-CM | POA: Insufficient documentation

## 2018-04-20 DIAGNOSIS — M810 Age-related osteoporosis without current pathological fracture: Secondary | ICD-10-CM | POA: Insufficient documentation

## 2018-04-20 DIAGNOSIS — E739 Lactose intolerance, unspecified: Secondary | ICD-10-CM | POA: Insufficient documentation

## 2018-04-20 DIAGNOSIS — Z880 Allergy status to penicillin: Secondary | ICD-10-CM | POA: Insufficient documentation

## 2018-04-20 DIAGNOSIS — F419 Anxiety disorder, unspecified: Secondary | ICD-10-CM | POA: Insufficient documentation

## 2018-04-20 DIAGNOSIS — H6192 Disorder of left external ear, unspecified: Secondary | ICD-10-CM

## 2018-04-20 DIAGNOSIS — Z87891 Personal history of nicotine dependence: Secondary | ICD-10-CM | POA: Insufficient documentation

## 2018-04-20 DIAGNOSIS — M5030 Other cervical disc degeneration, unspecified cervical region: Secondary | ICD-10-CM | POA: Diagnosis not present

## 2018-04-20 DIAGNOSIS — Z9842 Cataract extraction status, left eye: Secondary | ICD-10-CM | POA: Insufficient documentation

## 2018-04-20 DIAGNOSIS — H61002 Unspecified perichondritis of left external ear: Secondary | ICD-10-CM | POA: Insufficient documentation

## 2018-04-20 DIAGNOSIS — Z9841 Cataract extraction status, right eye: Secondary | ICD-10-CM | POA: Insufficient documentation

## 2018-04-20 DIAGNOSIS — I341 Nonrheumatic mitral (valve) prolapse: Secondary | ICD-10-CM | POA: Insufficient documentation

## 2018-04-20 DIAGNOSIS — F329 Major depressive disorder, single episode, unspecified: Secondary | ICD-10-CM | POA: Insufficient documentation

## 2018-04-20 DIAGNOSIS — Z888 Allergy status to other drugs, medicaments and biological substances status: Secondary | ICD-10-CM | POA: Insufficient documentation

## 2018-04-20 DIAGNOSIS — R32 Unspecified urinary incontinence: Secondary | ICD-10-CM | POA: Insufficient documentation

## 2018-04-20 DIAGNOSIS — Z9049 Acquired absence of other specified parts of digestive tract: Secondary | ICD-10-CM | POA: Insufficient documentation

## 2018-04-20 DIAGNOSIS — Z882 Allergy status to sulfonamides status: Secondary | ICD-10-CM | POA: Insufficient documentation

## 2018-04-20 HISTORY — PX: PREAURICULAR CYST EXCISION: SHX2264

## 2018-04-20 SURGERY — EXCISION, CYST OR SINUS, PREAURICULAR, PEDIATRIC
Anesthesia: General | Site: Ear | Laterality: Left

## 2018-04-20 MED ORDER — LIDOCAINE-EPINEPHRINE 2 %-1:100000 IJ SOLN
INTRAMUSCULAR | Status: AC
  Filled 2018-04-20: qty 1

## 2018-04-20 MED ORDER — FENTANYL CITRATE (PF) 100 MCG/2ML IJ SOLN: 25.0000 ug | INTRAMUSCULAR | Status: DC | PRN

## 2018-04-20 MED ORDER — CHLORHEXIDINE GLUCONATE CLOTH 2 % EX PADS: 6.0000 | MEDICATED_PAD | Freq: Once | CUTANEOUS | Status: DC

## 2018-04-20 MED ORDER — FENTANYL CITRATE (PF) 250 MCG/5ML IJ SOLN
INTRAMUSCULAR | Status: AC
  Filled 2018-04-20: qty 5

## 2018-04-20 MED ORDER — LIDOCAINE 2% (20 MG/ML) 5 ML SYRINGE
INTRAMUSCULAR | Status: AC
  Filled 2018-04-20: qty 5

## 2018-04-20 MED ORDER — LIDOCAINE-EPINEPHRINE 1 %-1:100000 IJ SOLN
INTRAMUSCULAR | Status: AC
  Filled 2018-04-20: qty 1

## 2018-04-20 MED ORDER — LACTATED RINGERS IV SOLN
INTRAVENOUS | Status: DC | PRN
  Administered 2018-04-20: 08:00:00 via INTRAVENOUS

## 2018-04-20 MED ORDER — CEFAZOLIN SODIUM-DEXTROSE 2-4 GM/100ML-% IV SOLN
INTRAVENOUS | Status: AC
  Filled 2018-04-20: qty 100

## 2018-04-20 MED ORDER — PROPOFOL 10 MG/ML IV BOLUS
INTRAVENOUS | Status: AC
  Filled 2018-04-20: qty 20

## 2018-04-20 MED ORDER — BACITRACIN ZINC 500 UNIT/GM EX OINT
TOPICAL_OINTMENT | CUTANEOUS | Status: AC
  Filled 2018-04-20: qty 28.35

## 2018-04-20 MED ORDER — FENTANYL CITRATE (PF) 100 MCG/2ML IJ SOLN
INTRAMUSCULAR | Status: DC | PRN
  Administered 2018-04-20: 25 ug via INTRAVENOUS

## 2018-04-20 MED ORDER — ACETAMINOPHEN 160 MG/5ML PO SOLN: 325.0000 mg | ORAL | Status: DC | PRN

## 2018-04-20 MED ORDER — LIDOCAINE 2% (20 MG/ML) 5 ML SYRINGE
INTRAMUSCULAR | Status: DC | PRN
  Administered 2018-04-20: 100 mg via INTRAVENOUS

## 2018-04-20 MED ORDER — ACETAMINOPHEN 325 MG PO TABS: 325.0000 mg | ORAL_TABLET | ORAL | Status: DC | PRN

## 2018-04-20 MED ORDER — LIDOCAINE-EPINEPHRINE 1 %-1:100000 IJ SOLN
INTRAMUSCULAR | Status: DC | PRN
  Administered 2018-04-20: 1 mL

## 2018-04-20 MED ORDER — DEXAMETHASONE SODIUM PHOSPHATE 10 MG/ML IJ SOLN
INTRAMUSCULAR | Status: AC
  Filled 2018-04-20: qty 1

## 2018-04-20 MED ORDER — HYDROCODONE-ACETAMINOPHEN 5-325 MG PO TABS
1.0000 | ORAL_TABLET | Freq: Four times a day (QID) | ORAL | 0 refills | Status: AC | PRN
Start: 2018-04-20 — End: ?

## 2018-04-20 MED ORDER — CEFAZOLIN SODIUM-DEXTROSE 2-4 GM/100ML-% IV SOLN
2.0000 g | INTRAVENOUS | Status: AC
  Administered 2018-04-20: 2 g via INTRAVENOUS

## 2018-04-20 MED ORDER — BACITRACIN ZINC 500 UNIT/GM EX OINT
TOPICAL_OINTMENT | CUTANEOUS | Status: DC | PRN
  Administered 2018-04-20: 1 via TOPICAL

## 2018-04-20 MED ORDER — OXYCODONE HCL 5 MG PO TABS: 5.0000 mg | ORAL_TABLET | Freq: Once | ORAL | Status: DC | PRN

## 2018-04-20 MED ORDER — ONDANSETRON HCL 4 MG/2ML IJ SOLN
INTRAMUSCULAR | Status: DC | PRN
  Administered 2018-04-20: 4 mg via INTRAVENOUS

## 2018-04-20 MED ORDER — CEPHALEXIN 500 MG PO CAPS: 500.0000 mg | ORAL_CAPSULE | Freq: Three times a day (TID) | ORAL | 0 refills | Status: AC

## 2018-04-20 MED ORDER — DEXAMETHASONE SODIUM PHOSPHATE 10 MG/ML IJ SOLN
10.0000 mg | Freq: Once | INTRAMUSCULAR | Status: AC
  Administered 2018-04-20: 10 mg via INTRAVENOUS

## 2018-04-20 MED ORDER — OXYCODONE HCL 5 MG/5ML PO SOLN: 5.0000 mg | Freq: Once | ORAL | Status: DC | PRN

## 2018-04-20 MED ORDER — ONDANSETRON HCL 4 MG/2ML IJ SOLN
INTRAMUSCULAR | Status: AC
  Filled 2018-04-20: qty 2

## 2018-04-20 MED ORDER — PROPOFOL 10 MG/ML IV BOLUS
INTRAVENOUS | Status: DC | PRN
  Administered 2018-04-20: 100 mg via INTRAVENOUS

## 2018-04-20 SURGICAL SUPPLY — 48 items
BLADE SURG 15 STRL LF DISP TIS (BLADE) IMPLANT
BLADE SURG 15 STRL SS (BLADE) ×3
BNDG GAUZE ELAST 4 BULKY (GAUZE/BANDAGES/DRESSINGS) IMPLANT
CANISTER SUCT 3000ML PPV (MISCELLANEOUS) ×3 IMPLANT
CLEANER TIP ELECTROSURG 2X2 (MISCELLANEOUS) ×3 IMPLANT
CONT SPEC 4OZ CLIKSEAL STRL BL (MISCELLANEOUS) ×5 IMPLANT
CORDS BIPOLAR (ELECTRODE) ×2 IMPLANT
COVER SURGICAL LIGHT HANDLE (MISCELLANEOUS) ×3 IMPLANT
DRAIN CHANNEL 7F FF FLAT (WOUND CARE) IMPLANT
DRAIN PENROSE 1/4X12 LTX STRL (WOUND CARE) IMPLANT
DRAPE HALF SHEET 40X57 (DRAPES) IMPLANT
DRSG TELFA 3X8 NADH (GAUZE/BANDAGES/DRESSINGS) ×3 IMPLANT
ELECT COATED BLADE 2.86 ST (ELECTRODE) ×3 IMPLANT
ELECT REM PT RETURN 9FT ADLT (ELECTROSURGICAL) ×3
ELECT REM PT RETURN 9FT PED (ELECTROSURGICAL)
ELECTRODE REM PT RETRN 9FT PED (ELECTROSURGICAL) IMPLANT
ELECTRODE REM PT RTRN 9FT ADLT (ELECTROSURGICAL) ×1 IMPLANT
EVACUATOR SILICONE 100CC (DRAIN) IMPLANT
GAUZE SPONGE 4X4 12PLY STRL (GAUZE/BANDAGES/DRESSINGS) IMPLANT
GLOVE BIO SURGEON STRL SZ7.5 (GLOVE) ×2 IMPLANT
GLOVE BIOGEL M 7.0 STRL (GLOVE) ×8 IMPLANT
GLOVE SURG SS PI 7.5 STRL IVOR (GLOVE) ×2 IMPLANT
GOWN STRL REUS W/ TWL LRG LVL3 (GOWN DISPOSABLE) ×2 IMPLANT
GOWN STRL REUS W/TWL LRG LVL3 (GOWN DISPOSABLE) ×6
KIT BASIN OR (CUSTOM PROCEDURE TRAY) ×3 IMPLANT
KIT TURNOVER KIT B (KITS) ×3 IMPLANT
LOCATOR NERVE 3 VOLT (DISPOSABLE) IMPLANT
NDL HYPO 25GX1X1/2 BEV (NEEDLE) IMPLANT
NDL HYPO 30X.5 LL (NEEDLE) IMPLANT
NEEDLE HYPO 25GX1X1/2 BEV (NEEDLE) IMPLANT
NEEDLE HYPO 30X.5 LL (NEEDLE) ×3 IMPLANT
NS IRRIG 1000ML POUR BTL (IV SOLUTION) ×3 IMPLANT
PAD ARMBOARD 7.5X6 YLW CONV (MISCELLANEOUS) ×6 IMPLANT
PAD DRESSING TELFA 3X8 NADH (GAUZE/BANDAGES/DRESSINGS) IMPLANT
PENCIL BUTTON HOLSTER BLD 10FT (ELECTRODE) ×3 IMPLANT
STAPLER VISISTAT 35W (STAPLE) ×3 IMPLANT
SUT ETHILON 5 0 PS 2 18 (SUTURE) ×2 IMPLANT
SUT ETHILON 6 0 P 1 (SUTURE) ×4 IMPLANT
SUT SILK 4 0 PS 2 (SUTURE) ×2 IMPLANT
SUT VIC AB 3-0 PS2 18 (SUTURE)
SUT VIC AB 3-0 PS2 18XBRD (SUTURE) IMPLANT
SUT VIC AB 4-0 P-3 18X BRD (SUTURE) IMPLANT
SUT VIC AB 4-0 P3 18 (SUTURE)
SUT VIC AB 5-0 PS2 18 (SUTURE) ×2 IMPLANT
SWAB COLLECTION DEVICE MRSA (MISCELLANEOUS) IMPLANT
TOWEL OR 17X24 6PK STRL BLUE (TOWEL DISPOSABLE) ×3 IMPLANT
TRAY ENT MC OR (CUSTOM PROCEDURE TRAY) ×3 IMPLANT
WATER STERILE IRR 1000ML POUR (IV SOLUTION) ×3 IMPLANT

## 2018-04-20 NOTE — Op Note (Signed)
Operative Note: Excision and Reconstruction of 2 cm Left Auricular Lesion  Patient: Dana [name]  Medical record number: [id number]  Date:04/20/2018  Pre-operative Indications: 2 cm Left Auricular Lesion  Postoperative Indications: Same  Surgical Procedure:  1.  Excision left auricular lesion with removal of conchal cartilage (2 x 2 cm)    2.  Reconstruction left auricular defect with 2 cm full-thickness skin graft    3.  Closure of skin graft donor site defect  Anesthesia: GET/LMA  Surgeon: Barbee Cough, M.D.  Assist: None  Complications: None  EBL: Minimal   Brief History: The patient is a 72 y.o. female with a history of painful swollen lesion in the left auricular antihelix.  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 Given the patient's history and findings I recommended wide local excision of the lesion with primary full-thickness skin graft reconstruction under general anesthesia, risks and benefits were discussed in detail with the patient and her family. They understand and agree with our plan for surgery which is scheduled at [facility] OR on an elective basis.  Surgical Procedure: The patient is brought to the operating room on 04/20/2018 and placed in supine position on the operating table. General LMA anesthesia was established without difficulty. When the patient was adequately anesthetized, surgical timeout was performed and correct identification of the patient and the surgical procedure. The patient was positioned and prepped and draped in sterile fashion.  The left auricular antihelix lesion was inspected and then carefully demarcated with a 1/2 cm margin.  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  The area was infiltrated with 1 cc of 1% lidocaine 1 100,000 dilution epinephrine which was injected in the  subcutaneous and perichondrial fashion.  Using a 15 scalpel the entire area was excised treating a 2 cm defect, incision was carried through the skin and underlying subcutaneous tissue to the level of the lateral perichondrium which represented the initial deep margin.  The specimen was marked for pathology and sent for gross microscopic evaluation.  As a deep margin the underlying antihelix/cochal cartilage was excised to the medial perichondrium.  The underlying cartilage appeared normal but was excised for adequate control.  This created a 2 x 2 centimeter defect requiring reconstruction.  The left postauricular crease was inspected a 2 x 2 centimeter skin flap was outlined and using a 15 scalpel this was incised through the skin and underlying deep subcutaneous tissue creating a thin full-thickness skin graft.  Reconstruction of the donor site was undertaken using bipolar cautery for hemostasis.  The incision was closed by creating an elliptical defect in the postauricular sulcus.  Deep soft tissue flaps were elevated along the posterior aspect of the auricular helix and in the postauricular sulcus.  Soft tissue was then reapproximated with interrupted 5-0 Vicryl suture and the skin edge was closed with interrupted 5-0 Ethilon suture, dressed with bacitracin ointment.  The harvested full-thickness skin graft was then inset into the auricular defect.  This was sutured into position with multiple interrupted 6-0 Ethilon suture at the margin with complete closure of the surgical defect.  A through and through mattress suture was then performed using 5-0 Ethilon suture to coapt the full-thickness skin graft to the underlying soft tissue.  There is no significant bleeding.  The wound was then dressed with bacitracin ointment.   Patient was awakened from anesthetic and transferred from the operating  room to the recovery room in stable condition. There were no complications and blood loss was minimal.   Barbee Coughavid  L Isel Skufca, M.D. [facility] 04/20/2018

## 2018-04-20 NOTE — Anesthesia Preprocedure Evaluation (Addendum)
Anesthesia Evaluation  Patient identified by MRN, date of birth, ID band Patient awake    Reviewed: Allergy & Precautions, NPO status , Patient's Chart, lab work & pertinent test results  Airway Mallampati: II  TM Distance: >3 FB Neck ROM: Full    Dental no notable dental hx.    Pulmonary neg pulmonary ROS, former smoker,    breath sounds clear to auscultation       Cardiovascular negative cardio ROS   Rhythm:Regular Rate:Normal     Neuro/Psych PSYCHIATRIC DISORDERS Anxiety Depression dystonia  Neuromuscular disease    GI/Hepatic negative GI ROS, Neg liver ROS,   Endo/Other  negative endocrine ROS  Renal/GU negative Renal ROS     Musculoskeletal   Abdominal   Peds  Hematology negative hematology ROS (+)   Anesthesia Other Findings   Reproductive/Obstetrics                            Anesthesia Physical Anesthesia Plan  ASA: III  Anesthesia Plan: General   Post-op Pain Management:    Induction: Intravenous  PONV Risk Score and Plan: 3 and Ondansetron and Dexamethasone  Airway Management Planned: LMA and Oral ETT  Additional Equipment: None  Intra-op Plan:   Post-operative Plan: Extubation in OR  Informed Consent: I have reviewed the patients History and Physical, chart, labs and discussed the procedure including the risks, benefits and alternatives for the proposed anesthesia with the patient or authorized representative who has indicated his/her understanding and acceptance.   Dental advisory given  Plan Discussed with: CRNA and Surgeon  Anesthesia Plan Comments:         Anesthesia Quick Evaluation

## 2018-04-20 NOTE — Discharge Instructions (Signed)
Excision of Skin Lesions, Care After Refer to this sheet in the next few weeks. These instructions provide you with information about caring for yourself after your procedure. Your health care provider may also give you more specific instructions. Your treatment has been planned according to current medical practices, but problems sometimes occur. Call your health care provider if you have any problems or questions after your procedure. What can I expect after the procedure? After your procedure, it is common to have pain or discomfort at the excision site. Follow these instructions at home:  Take over-the-counter and prescription medicines only as told by your health care provider.  Follow instructions from your health care provider about: ? How to take care of your excision site. You should keep the site clean, dry, and protected for at least 48 hours. ? When and how you should change your bandage (dressing). ? When you should remove your dressing. ? Removing whatever was used to close your excision site.  Check the excision area every day for signs of infection. Watch for: ? Redness, swelling, or pain. ? Fluid, blood, or pus.  For bleeding, apply gentle but firm pressure to the area using a folded towel for 20 minutes.  Avoid high-impact exercise and activities until the stitches (sutures) are removed or the area heals.  Follow instructions from your health care provider about how to minimize scarring. Avoid sun exposure until the area has healed. Scarring should lessen over time.  Keep all follow-up visits as told by your health care provider. This is important. Contact a health care provider if:  You have a fever.  You have redness, swelling, or pain at the excision site.  You have fluid, blood, or pus coming from the excision site.  You have ongoing bleeding at the excision site.  You have pain that does not improve in 2-3 days after your procedure.  You notice skin  irregularities or changes in sensation. This information is not intended to replace advice given to you by your health care provider. Make sure you discuss any questions you have with your health care provider. Document Released: 03/10/2015 Document Revised: 03/31/2016 Document Reviewed: 12/10/2014 Elsevier Interactive Patient Education  2018 [employer]   Hives Hives (urticaria) are itchy, red, swollen areas on your skin. Hives can show up on any part of your body, and they can vary in size. They can be as small as the tip of a pen or much larger. Hives often fade within 24 hours (acute hives). In other cases, new hives show up after old ones fade. This can continue for many days or weeks (chronic hives). Hives are caused by your body's reaction to an irritant or to something that you are allergic to (trigger). You can get hives right after being around a trigger or hours later. Hives do not spread from person to person (are not contagious). Hives may get worse if you scratch them, if you exercise, or if you have worries (emotional stress). Follow these instructions at home: Medicines  Take or apply over-the-counter and prescription medicines only as told by your doctor.  If you were prescribed an antibiotic medicine, use it as told by your doctor. Do not stop taking the antibiotic even if you start to feel better. Skin Care  Apply cool, wet cloths (cool compresses) to the itchy, red, swollen areas.  Do not scratch your skin. Do not rub your skin. General instructions  Do not take hot showers or baths. This can make itching  worse.  Do not wear tight clothes.  Use sunscreen and wear clothing that covers your skin when you are outside.  Avoid any triggers that cause your hives. Keep a journal to help you keep track of what causes your hives. Write down: ? What medicines you take. ? What you eat and drink. ? What products you use on your skin.  Keep all follow-up visits as told by  your doctor. This is important. Contact a doctor if:  Your symptoms are not better with medicine.  Your joints are painful or swollen. Get help right away if:  You have a fever.  You have belly pain.  Your tongue or lips are swollen.  Your eyelids are swollen.  Your chest or throat feels tight.  You have trouble breathing or swallowing. These symptoms may be an emergency. Do not wait to see if the symptoms will go away. Get medical help right away. Call your local emergency services (911 in the [place] Do not drive yourself to the hospital. This information is not intended to replace advice given to you by your health care provider. Make sure you discuss any questions you have with your health care provider. Document Released: 08/02/2008 Document Revised: 03/31/2016 Document Reviewed: 08/12/2015 Elsevier Interactive Patient Education  2018 [employer].   Intertrigo Intertrigo is skin irritation (inflammation) that happens in warm, moist areas of the body. The irritation can cause a rash and make skin raw and itchy. The rash is usually pink or red. It happens mostly between folds of skin or where skin rubs together, such as:  Toes.  Armpits.  Groin.  Belly.  Breasts.  Buttocks.  This condition is not passed from person to person (is not contagious). Follow these instructions at home:  Keep the affected area clean and dry.  Do not scratch your skin.  Stay cool as much as possible. Use an air conditioner or fan, if you can.  Apply over-the-counter and prescription medicines only as told by your doctor.  If you were prescribed an antibiotic medicine, use it as told by your doctor. Do not stop using the antibiotic even if your condition starts to get better.  Keep all follow-up visits as told by your doctor. This is important. How is this prevented?  Stay at a healthy weight.  Keep your feet dry. This is very important if you have diabetes. Wear cotton or wool  socks.  Take care of and protect the skin in your groin and butt area as told by your doctor.  Do not wear tight clothes. Wear clothes that: ? Are loose. ? Take away moisture from your body. ? Are made of cotton.  Wear a bra that gives good support, if needed.  Shower and dry yourself fully after being active.  Keep your blood sugar under control if you have diabetes. Contact a doctor if:  Your symptoms do not get better with treatment.  Your symptoms get worse or they spread.  You notice more redness and warmth.  You have a fever. This information is not intended to replace advice given to you by your health care provider. Make sure you discuss any questions you have with your health care provider. Document Released: 11/26/2010 Document Revised: 03/31/2016 Document Reviewed: 04/27/2015 Elsevier Interactive Patient Education  2018 [employer].  Excision of Skin Lesions Excision of a skin lesion refers to the removal of a section of skin by making small cuts (incisions) in the skin. This procedure may be done to remove  a cancerous (malignant) or noncancerous (benign) growth on the skin. It is typically done to treat or prevent cancer or infection. It may also be done to improve cosmetic appearance. The procedure may be done to remove:  Cancerous growths, such as basal cell carcinoma, squamous cell carcinoma, or melanoma.  Noncancerous growths, such as a cyst or lipoma.  Growths, such as moles or skin tags, which may be removed for cosmetic reasons.  Various excision or surgical techniques may be used depending on your condition, the location of the lesion, and your overall health. Tell a health care provider about:  Any allergies you have.  All medicines you are taking, including vitamins, herbs, eye drops, creams, and over-the-counter medicines.  Any problems you or family members have had with anesthetic medicines.  Any blood disorders you have.  Any surgeries you  have had.  Any medical conditions you have.  Whether you are pregnant or may be pregnant. What are the risks? Generally, this is a safe procedure. However, problems may occur, including:  Bleeding.  Infection.  Scarring.  Recurrence of the cyst, lipoma, or cancer.  Changes in skin sensation or appearance, such as discoloration or swelling.  Reaction to the anesthetics.  Allergic reaction to surgical materials or ointments.  Damage to nerves, blood vessels, muscles, or other structures.  Continued pain.  What happens before the procedure?  Ask your health care provider about: ? Changing or stopping your regular medicines. This is especially important if you are taking diabetes medicines or blood thinners. ? Taking medicines such as aspirin and ibuprofen. These medicines can thin your blood. Do not take these medicines before your procedure if your health care provider instructs you not to.  You may be asked to take certain medicines.  You may be asked to stop smoking.  You may have an exam or testing.  Plan to have someone take you home after the procedure.  Plan to have someone help you with activities during recovery. What happens during the procedure?  To reduce your risk of infection: ? Your health care team will wash or sanitize their hands. ? Your skin will be washed with soap.  You will be given a medicine to numb the area (local anesthetic).  One of the following excision techniques will be performed.  At the end of any of these procedures, antibiotic ointment will be applied as needed. Each of the following techniques may vary among health care providers and hospitals. Complete Surgical Excision The area of skin that needs to be removed will be marked with a pen. Using a small scalpel or scissors, the surgeon will gently cut around and under the lesion until it is completely removed. The lesion will be placed in a fluid and sent to the lab for  examination. If necessary, bleeding will be controlled with a device that delivers heat (electrocautery). The edges of the wound may be stitched (sutured) together, and a bandage (dressing) will be applied. This procedure may be performed to treat a cancerous growth or a noncancerous cyst or lesion. Excision of a Cyst The surgeon will make an incision on the cyst. The entire cyst will be removed through the incision. The incision may be closed with sutures. Shave Excision During shave excision, the surgeon will use a small blade or an electrically heated loop instrument to shave off the lesion. This may be done to remove a mole or a skin tag. The wound will usually be left to heal on its own without  sutures. Punch Excision During punch excision, the surgeon will use a small tool that is like a cookie cutter or a hole punch to cut a circle shape out of the skin. The outer edges of the skin will be sutured together. This may be done to remove a mole or a scar or to perform a biopsy of the lesion. Mohs Micrographic Surgery During Mohs micrographic surgery, layers of the lesion will be removed with a scalpel or a loop instrument and will be examined right away under a microscope. Layers will be removed until all of the abnormal or cancerous tissue has been removed. This procedure is minimally invasive, and it ensures the best cosmetic outcome. It involves the removal of as little normal tissue as possible. Mohs is usually done to treat skin cancer, such as basal cell carcinoma or squamous cell carcinoma, particularly on the face and ears. Depending on the size of the surgical wound, it may be sutured closed. What happens after the procedure?  Return to your normal activities as told by your health care provider.  Talk with your health care provider to discuss any test results, treatment options, and if necessary, the need for more tests. This information is not intended to replace advice given to you by  your health care provider. Make sure you discuss any questions you have with your health care provider. Document Released: 01/18/2010 Document Revised: 03/31/2016 Document Reviewed: 12/10/2014 Elsevier Interactive Patient Education  2018 [employer]   Hives Hives (urticaria) are itchy, red, swollen areas on your skin. Hives can appear on any part of your body and can vary in size. They can be as small as the tip of a pen or much larger. Hives often fade within 24 hours (acute hives). In other cases, new hives appear after old ones fade. This cycle can continue for several days or weeks (chronic hives). Hives result from your body's reaction to an irritant or to something that you are allergic to (trigger). When you are exposed to a trigger, your body releases a chemical (histamine) that causes redness, itching, and swelling. You can get hives immediately after being exposed to a trigger or hours later. Hives do not spread from person to person (are not contagious). Your hives may get worse with scratching, exercise, and emotional stress. What are the causes? Causes of this condition include:  Allergies to certain foods or ingredients.  Insect bites or stings.  Exposure to pollen or pet dander.  Contact with latex or chemicals.  Spending time in sunlight, heat, or cold (exposure).  Exercise.  Stress.  You can also get hives from some medical conditions and treatments. These include:  Viruses, including the common cold.  Bacterial infections, such as urinary tract infections and strep throat.  Disorders such as vasculitis, lupus, or thyroid disease.  Certain medications.  Allergy shots.  Blood transfusions.  Sometimes, the cause of hives is not known (idiopathic hives). What increases the risk? This condition is more likely to develop in:  Women.  People who have food allergies, especially to citrus fruits, milk, eggs, peanuts, tree nuts, or shellfish.  People who are  allergic to: ? Medicines. ? Latex. ? Insects. ? Animals. ? Pollen.  People who have certain medical conditions, includinglupus or thyroid disease.  What are the signs or symptoms? The main symptom of this condition is raised, itchyred or white bumps or patches on your skin. These areas may:  Become large and swollen (welts).  Change in shape and location,  quickly and repeatedly.  Be separate hives or connect over a large area of skin.  Sting or become painful.  Turn white when pressed in the center (blanch).  In severe cases, yourhands, feet, and face may also become swollen. This may occur if hives develop deeper in your skin. How is this diagnosed? This condition is diagnosed based on your symptoms, medical history, and physical exam. Your skin, urine, or blood may be tested to find out what is causing your hives and to rule out other health issues. Your health care provider may also remove a small sample of skin from the affected area and examine it under a microscope (biopsy). How is this treated? Treatment depends on the severity of your condition. Your health care provider may recommend using cool, wet cloths (cool compresses) or taking cool showers to relieve itching. Hives are sometimes treated with medicines, including:  Antihistamines.  Corticosteroids.  Antibiotics.  An injectable medicine (omalizumab). Your health care provider may prescribe this if you have chronic idiopathic hives and you continue to have symptoms even after treatment with antihistamines.  Severe cases may require an emergency injection of adrenaline (epinephrine) to prevent a life-threatening allergic reaction (anaphylaxis). Follow these instructions at home: Medicines  Take or apply over-the-counter and prescription medicines only as told by your health care provider.  If you were prescribed an antibiotic medicine, use it as told by your health care provider. Do not stop taking the  antibiotic even if you start to feel better. Skin Care  Apply cool compresses to the affected areas.  Do not scratch or rub your skin. General instructions  Do not take hot showers or baths. This can make itching worse.  Do not wear tight-fitting clothing.  Use sunscreen and wear protective clothing when you are outside.  Avoid any substances that cause your hives. Keep a journal to help you track what causes your hives. Write down: ? What medicines you take. ? What you eat and drink. ? What products you use on your skin.  Keep all follow-up visits as told by your health care provider. This is important. Contact a health care provider if:  Your symptoms are not controlled with medicine.  Your joints are painful or swollen. Get help right away if:  You have a fever.  You have pain in your abdomen.  Your tongue or lips are swollen.  Your eyelids are swollen.  Your chest or throat feels tight.  You have trouble breathing or swallowing. These symptoms may represent a serious problem that is an emergency. Do not wait to see if the symptoms will go away. Get medical help right away. Call your local emergency services (911 in the [place] Do not drive yourself to the hospital. This information is not intended to replace advice given to you by your health care provider. Make sure you discuss any questions you have with your health care provider. Document Released: 10/24/2005 Document Revised: 03/23/2016 Document Reviewed: 08/12/2015 Elsevier Interactive Patient Education  2018 [employer].   Celesta Aver Intertrigo is skin irritation or inflammation (dermatitis) that occurs when folds of skin rub together. The irritation can cause a rash and make skin raw and itchy. This condition most commonly occurs in the skin folds of these areas:  Toes.  Armpits.  Groin.  Belly.  Breasts.  Buttocks.  Intertrigo is not passed from person to person (is not contagious). What are  the causes? This condition is caused by heat, moisture, friction, and lack of air circulation.  The condition can be made worse by:  Sweat.  Bacteria or a fungus, such as yeast.  What increases the risk? This condition is more likely to occur if you have moisture in your skin folds. It is also more likely to develop in people who:  Have diabetes.  Are overweight.  Are on bed rest.  Live in a warm and moist climate.  Wear splints, braces, or other medical devices.  Are not able to control their bowels or bladder (have incontinence).  What are the signs or symptoms? Symptoms of this condition include:  A pink or red skin rash.  Brown patches on the skin.  Raw or scaly skin.  Itchiness.  A burning feeling.  Bleeding.  Leaking fluid.  A bad smell.  How is this diagnosed? This condition is diagnosed with a medical history and physical exam. You may also have a skin swab to test for bacteria or a fungus, such as yeast. How is this treated? Treatment may include:  Cleaning and drying your skin.  An oral antibiotic medicine or antibiotic skin cream for a bacterial infection.  Antifungal cream or pills for an infection that was caused by a fungus, such as yeast.  Steroid ointment to relieve itchiness and irritation.  Follow these instructions at home:  Keep the affected area clean and dry.  Do not scratch your skin.  Stay in a cool environment as much as possible. Use an air conditioner or fan, if available.  Apply over-the-counter and prescription medicines only as told by your health care provider.  If you were prescribed an antibiotic medicine, use it as told by your health care provider. Do not stop using the antibiotic even if your condition improves.  Keep all follow-up visits as told by your health care provider. This is important. How is this prevented?  Maintain a healthy weight.  Take care of your feet, especially if you have diabetes. Foot care  includes: ? Wearing shoes that fit well. ? Keeping your feet dry. ? Wearing clean, breathable socks.  Protect the skin around your groin and buttocks, especially if you have incontinence. Skin protection includes: ? Following a regular cleaning routine. ? Using moisturizers and skin protectants. ? Changing protection pads frequently.  Do not wear tight clothes. Wear clothes that are loose and absorbent. Wear clothes that are made of cotton.  Wear a bra that gives good support, if needed.  Shower and dry yourself thoroughly after activity. Use a hair dryer on a cool setting to dry between skin folds, especially after you bathe.  If you have diabetes, keep your blood sugar under control. Contact a health care provider if:  Your symptoms do not improve with treatment.  Your symptoms get worse or they spread.  You notice increased redness and warmth.  You have a fever. This information is not intended to replace advice given to you by your health care provider. Make sure you discuss any questions you have with your health care provider. Document Released: 10/24/2005 Document Revised: 03/31/2016 Document Reviewed: 04/27/2015 Elsevier Interactive Patient Education  2018 [employer].

## 2018-04-20 NOTE — Anesthesia Postprocedure Evaluation (Signed)
Anesthesia Post Note  Patient: Dana [name]  Procedure(s) Performed: EXCISION PREAURICULAR CYST PEDIATRIC (Left Ear)     Patient location during evaluation: PACU Anesthesia Type: General Level of consciousness: awake and patient cooperative Pain management: pain level controlled Vital Signs Assessment: post-procedure vital signs reviewed and stable Respiratory status: spontaneous breathing, nonlabored ventilation, respiratory function stable and patient connected to nasal cannula oxygen Cardiovascular status: blood pressure returned to baseline and stable Postop Assessment: no apparent nausea or vomiting Anesthetic complications: no    Last Vitals:  Vitals:   04/20/18 1015 04/20/18 1042  BP: 127/64 (!) 117/56  Pulse: 70   Resp: 12   Temp: 36.7 C 36.7 C  SpO2: 94% 94%    Last Pain:  Vitals:   04/20/18 1042  TempSrc:   PainSc: 0-No pain                 Aryonna Gunnerson

## 2018-04-20 NOTE — Transfer of Care (Signed)
Immediate Anesthesia Transfer of Care Note  Patient: Dana [name]  Procedure(s) Performed: EXCISION PREAURICULAR CYST PEDIATRIC (Left Ear)  Patient Location: PACU  Anesthesia Type:General  Level of Consciousness: drowsy and patient cooperative  Airway & Oxygen Therapy: Patient Spontanous Breathing and Patient connected to nasal cannula oxygen  Post-op Assessment: Report given to RN, Post -op Vital signs reviewed and stable and Patient moving all extremities  Post vital signs: Reviewed and stable  Last Vitals:  Vitals Value Taken Time  BP 118/63 04/20/2018  9:41 AM  Temp    Pulse 64 04/20/2018  9:43 AM  Resp 6 04/20/2018  9:43 AM  SpO2 100 % 04/20/2018  9:43 AM  Vitals shown include unvalidated device data.  Last Pain:  Vitals:   04/20/18 0701  TempSrc:   PainSc: 0-No pain      Patients Stated Pain Goal: 2 (04/20/18 0701)  Complications: No apparent anesthesia complications

## 2018-04-20 NOTE — Anesthesia Procedure Notes (Signed)
Procedure Name: LMA Insertion Date/Time: 04/20/2018 8:22 AM Performed by: Shireen QuanButler, Wendell Nicoson R, CRNA Pre-anesthesia Checklist: Patient identified, Emergency Drugs available, Suction available and Patient being monitored Patient Re-evaluated:Patient Re-evaluated prior to induction Oxygen Delivery Method: Circle System Utilized Preoxygenation: Pre-oxygenation with 100% oxygen Induction Type: IV induction Ventilation: Mask ventilation without difficulty LMA: LMA inserted LMA Size: 3.0 Number of attempts: 1 Placement Confirmation: positive ETCO2 Tube secured with: Tape Dental Injury: Teeth and Oropharynx as per pre-operative assessment

## 2018-04-20 NOTE — H&P (Signed)
Dana [name] is an 72 y.o. female.   Chief Complaint: Painful lesion Left auricular anti-helix HPI: hx of Painful lesion Left auricular anti-helix, nonhealing lesion  Past Medical History:  Diagnosis Date  . Anxiety   . Cervical dystonia    TREATED BY NEUROLOGIST AT [facility]--  BOTOX INJECTIONS  . Closed fracture of left distal radius   . Colitis   . DDD (degenerative disc disease), cervical   . Depression   . History of pericarditis    1989  . IBS (irritable bowel syndrome)   . MVP (mitral valve prolapse)   . Osteoporosis   . Spondylosis of cervical spine   . Torticollis, spasmodic   . Urinary incontinence   . Wears glasses     Past Surgical History:  Procedure Laterality Date  . CARDIAC CATHETERIZATION  04-05-2000  dr al little   normal LVF/  ef 60%/  no coronary disaease/  MVP with mild MR  . CATARACT EXTRACTION W/ INTRAOCULAR LENS  IMPLANT, BILATERAL Bilateral   . CATARACT EXTRACTION W/ INTRAOCULAR LENS IMPLANT Bilateral   . CLOSED REDUCTION RADIAL SHAFT Left 11/27/2014   Procedure: CLOSED REDUCTION DISTAL RADIUS WITH PINS;  Surgeon: [name], MD;  Location: [facility];  Service: Orthopedics;  Laterality: Left;  . COLONOSCOPY W/ POLYPECTOMY  03-30-2001  . COLONOSCOPY WITH PROPOFOL N/A 01/06/2017   Procedure: COLONOSCOPY WITH PROPOFOL;  Surgeon: Jeani Hawking, MD;  Location: [facility];  Service: Endoscopy;  Laterality: N/A;  . DILATION AND CURETTAGE OF UTERUS  yrs ago  . ESOPHAGOGASTRODUODENOSCOPY (EGD) WITH PROPOFOL N/A 01/06/2017   Procedure: ESOPHAGOGASTRODUODENOSCOPY (EGD) WITH PROPOFOL;  Surgeon: Jeani Hawking, MD;  Location: [facility];  Service: Endoscopy;  Laterality: N/A;  . LAPAROSCOPIC CHOLECYSTECTOMY  04-27-2000  . ROTATOR CUFF REPAIR Right 11/ 2000    History reviewed. No pertinent family history. Social History:  reports that she quit smoking about 29 years ago. Her smoking use included cigarettes. She has a 30.00 pack-year smoking  history. She has never used smokeless tobacco. She reports that she does not drink alcohol or use drugs.  Allergies:  Allergies  Allergen Reactions  . Penicillins Rash    PATIENT HAS HAD A PCN REACTION WITH IMMEDIATE RASH, FACIAL/TONGUE/THROAT SWELLING, SOB, OR LIGHTHEADEDNESS WITH HYPOTENSION:  #  #  YES  #  #  Has patient had a PCN reaction causing severe rash involving mucus membranes or skin necrosis: NO Has patient had a PCN reaction that required hospitalization NO Has patient had a PCN reaction occurring within the last 10 years: NO  . Hydrocodone Itching  . Lactose Intolerance (Gi) Diarrhea    ANY MILK PRODUCTS  . Reclast [Zoledronic Acid] Rash    Rash   . Sulfa Antibiotics Rash    Medications Prior to Admission  Medication Sig Dispense Refill  . Ascorbic Acid (VITAMIN C) 1000 MG tablet Take 1,000 mg by mouth daily.     Marland Kitchen bismuth subsalicylate (PEPTO BISMOL) 262 MG/15ML suspension Take 30 mLs by mouth every 6 (six) hours as needed for indigestion or diarrhea or loose stools.    Marland Kitchen buPROPion (WELLBUTRIN XL) 150 MG 24 hr tablet Take 150 mg by mouth daily.  2  . Cholecalciferol (VITAMIN D3) 2000 units TABS Take 6,000 Units by mouth daily.    . cholestyramine (QUESTRAN) 4 g packet Take 4 g by mouth every other day.     . clonazePAM (KLONOPIN) 1 MG tablet Take 1 mg by mouth 3 (three) times daily.     Marland Kitchen  COLLAGEN PO Take 3 capsules by mouth 2 (two) times daily. In the morning & in the afternoon.    . escitalopram (LEXAPRO) 20 MG tablet Take 20 mg by mouth 2 (two) times daily. IN THE MORNING & WITH LUNCH    . Ginger, Zingiber officinalis, (GINGER ROOT) 550 MG CAPS Take 1,100 mg by mouth daily.    . Glucosamine-Chondroitin (COSAMIN DS PO) Take 3 tablets by mouth daily.    Boris Lown. Krill Oil 500 MG CAPS Take 500 mg by mouth daily.    . Multiple Vitamin (MULTIVITAMIN WITH MINERALS) TABS tablet Take 1 tablet by mouth daily. Women's Multivitamin    . Polyethyl Glycol-Propyl Glycol (SYSTANE)  0.4-0.3 % SOLN Place 1 drop into both eyes 2 (two) times daily.    Marland Kitchen. RESVERATROL PO Take 500 mg by mouth daily.     Marland Kitchen. Specialty Vitamins Products (BRAIN) TABS Take 1 tablet by mouth 2 (two) times daily. Natrol Cognium 100 mg    . Turmeric 500 MG CAPS Take 500 mg by mouth daily.    . vitamin B-12 (CYANOCOBALAMIN) 1000 MCG tablet Take 1,000 mcg by mouth daily.    . modafinil (PROVIGIL) 200 MG tablet Take 100 mg by mouth 2 (two) times daily.  0  . OnabotulinumtoxinA (BOTOX IJ) Inject 1 Dose as directed every 3 (three) months. [facility]      No results found for this or any previous visit (from the past 48 hour(s)). No results found.  Review of Systems  Constitutional: Negative.   HENT: Negative.   Respiratory: Negative.   Cardiovascular: Negative.     Blood pressure (!) 146/62, pulse 62, temperature 97.6 F (36.4 C), temperature source Oral, resp. rate 18, SpO2 96 %. Physical Exam  Constitutional: She appears well-developed and well-nourished.  HENT:  Painful lesion Left auricular anti-helix  Neck: Normal range of motion. Neck supple.  Cardiovascular: Normal rate.  Respiratory: Effort normal.     Assessment/Plan Adm for OP excision and local reconstruction.  Dana Dimattia, MD 04/20/2018, 7:55 AM

## 2018-04-21 ENCOUNTER — Encounter (HOSPITAL_COMMUNITY): Payer: Self-pay | Admitting: Otolaryngology

## 2018-06-20 ENCOUNTER — Other Ambulatory Visit: Payer: Self-pay | Admitting: Internal Medicine

## 2018-06-20 ENCOUNTER — Ambulatory Visit (HOSPITAL_COMMUNITY): Payer: Medicare Other | Attending: Cardiology

## 2018-06-20 ENCOUNTER — Other Ambulatory Visit: Payer: Self-pay

## 2018-06-20 DIAGNOSIS — R5383 Other fatigue: Secondary | ICD-10-CM | POA: Diagnosis not present

## 2018-06-20 DIAGNOSIS — Z87891 Personal history of nicotine dependence: Secondary | ICD-10-CM | POA: Insufficient documentation

## 2018-06-20 DIAGNOSIS — I119 Hypertensive heart disease without heart failure: Secondary | ICD-10-CM | POA: Insufficient documentation

## 2018-06-20 DIAGNOSIS — I083 Combined rheumatic disorders of mitral, aortic and tricuspid valves: Secondary | ICD-10-CM | POA: Diagnosis not present

## 2018-06-27 ENCOUNTER — Other Ambulatory Visit (HOSPITAL_COMMUNITY): Payer: Self-pay | Admitting: Internal Medicine

## 2018-06-27 DIAGNOSIS — R5383 Other fatigue: Secondary | ICD-10-CM

## 2018-08-12 ENCOUNTER — Telehealth: Payer: Self-pay | Admitting: Neurology

## 2018-08-12 NOTE — Telephone Encounter (Signed)
She called Sunday about a UTI.   She is scheduled to see you on Tuesday 10/8 but she wants to reschedule.  I advised her to call back Monday to speak to the scheduling staff.   Advised to discuss UTI with her PCP.

## 2018-08-14 ENCOUNTER — Ambulatory Visit: Payer: Medicare Other | Admitting: Neurology

## 2018-10-02 ENCOUNTER — Ambulatory Visit (INDEPENDENT_AMBULATORY_CARE_PROVIDER_SITE_OTHER): Payer: Medicare Other | Admitting: Neurology

## 2018-10-02 ENCOUNTER — Encounter: Payer: Self-pay | Admitting: Neurology

## 2018-10-02 VITALS — BP 146/90 | HR 77 | Ht 64.0 in | Wt 85.5 lb

## 2018-10-02 DIAGNOSIS — G249 Dystonia, unspecified: Secondary | ICD-10-CM

## 2018-10-02 DIAGNOSIS — M542 Cervicalgia: Secondary | ICD-10-CM | POA: Diagnosis not present

## 2018-10-02 DIAGNOSIS — G243 Spasmodic torticollis: Secondary | ICD-10-CM | POA: Insufficient documentation

## 2018-10-02 MED ORDER — CARBIDOPA-LEVODOPA 25-100 MG PO TABS: 1.0000 | ORAL_TABLET | Freq: Three times a day (TID) | ORAL | 6 refills | Status: DC

## 2018-10-02 NOTE — Progress Notes (Signed)
PATIENT: Dana [name] DOB: 06/01/1946  Chief Complaint  Patient presents with  . New Patient (Initial Visit)    Np for dystonia and leg pains. Rm 4. Alone. Patient mentioned that she has been dealing with Dystonia for 10 years. She stated that she use to go to [facility] dystonia clinic. Her leg pain has been worse since August when she had a bladder infection.      HISTORICAL  Dana RingsShirley O Mcfann is a 72 year old female, seen in request by her primary care physician Dr. 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  I have reviewed and summarized the referring note from the referring physician.  She had a past medical history of mitral valve prolapse, hyperlipidemia, nephrolithiasis, depression anxiety,  She started to have abnormal neck posturing since early 1990s, difficulty sleeping at nighttime, forceful head turning to the left side, this gradually came on after she fell on a flight of steps, she was seen by Dr. love, was diagnosed with dystonia, over the years, dystonia gradually spread to involving her facial muscles, she gradually developed facial grimace, was forced to retire from her job due to difficulty speaking, aberrant facial and oral mandibular muscle movement, [facility], diagnosed with  spasmodic torticollis, severe orofacial, and oral mandibular dystonia, he had been receiving Botox a injection adjunctive with clonazepam 1 mg 3 times a day since 2001, most recent injection was on September 11, 2017.  188 units of onabobotulism toxin a was injected,   Right sternomastoid, left obliquus capitis inferioris 30 units each; left sternomastoid, posterior scalene, levator scapulae 20 units each; right and left middle scalene 10 units each; right and left orbicularis oculi, right and left platysma 8 units each; right and left corrugator, right and left zygomaticus 2 units each; right and left depressor anguli  oris 4 units each.  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, was treated with Provigil 100 mg twice a day, she also has had frequent diarrhea, was diagnosed with colitis,   Since 2018, she also noticed difficulty using her hands open bottles, abnormal hand posturing, joint pain, frequent bladder infections since August 2019, trouble walking, dizziness, leg swelling,   Laboratory evaluations in August 2019, normal CMP, TSH 0.78, CBC hemoglobin of 12.5,   REVIEW OF SYSTEMS: Full 14 system review of systems performed and notable only for fatigue, weight loss, swelling legs, blurred vision, joint pain, swelling, cramps, aching muscles, headache, numbness, weakness, sleepiness, difficulty swallowing, dizziness, tremor, depression, anxiety, too much sleep, decreased energy, change in appetite All other review of systems were negative.  ALLERGIES: Allergies  Allergen Reactions  . Penicillins Rash    PATIENT HAS HAD A PCN REACTION WITH IMMEDIATE RASH, FACIAL/TONGUE/THROAT SWELLING, SOB, OR LIGHTHEADEDNESS WITH HYPOTENSION:  #  #  YES  #  #  Has patient had a PCN reaction causing severe rash involving mucus membranes or skin necrosis: NO Has patient had a PCN reaction that required hospitalization NO Has patient had a PCN reaction occurring within the last 10 years: NO  . Hydrocodone Itching  . Lactose Intolerance (Gi) Diarrhea    ANY MILK PRODUCTS  . Reclast [Zoledronic Acid] Rash    Rash   .  Sulfa Antibiotics Rash    HOME MEDICATIONS: Current Outpatient Medications  Medication Sig Dispense Refill  . Ascorbic Acid (VITAMIN C) 1000 MG tablet Take 1,000 mg by mouth daily.     Marland Kitchen bismuth subsalicylate (PEPTO BISMOL) 262  MG/15ML suspension Take 30 mLs by mouth as needed for indigestion or diarrhea or loose stools.     Marland Kitchen buPROPion (WELLBUTRIN XL) 150 MG 24 hr tablet Take 150 mg by mouth daily.  2  . Cholecalciferol (VITAMIN D3) 2000 units TABS Take 6,000 Units by mouth daily.    . clonazePAM (KLONOPIN) 1 MG tablet Take 1 mg by mouth 3 (three) times daily.     . COLLAGEN PO Take 3 capsules by mouth 2 (two) times daily. In the morning & in the afternoon.    . Ginger, Zingiber officinalis, (GINGER ROOT) 550 MG CAPS Take 1,100 mg by mouth daily.    . Glucosamine-Chondroitin (COSAMIN DS PO) Take 3 tablets by mouth daily.    Boris Lown Oil 500 MG CAPS Take 500 mg by mouth daily.    . modafinil (PROVIGIL) 200 MG tablet Take 100 mg by mouth 2 (two) times daily.  0  . Multiple Vitamin (MULTIVITAMIN WITH MINERALS) TABS tablet Take 1 tablet by mouth daily. Women's Multivitamin    . OnabotulinumtoxinA (BOTOX IJ) Inject 1 Dose as directed every 3 (three) months. Botox Injection for Dystonia Administer at [facility]    . Polyethyl Glycol-Propyl Glycol (SYSTANE) 0.4-0.3 % SOLN Place 1 drop into both eyes 2 (two) times daily.    Marland Kitchen RESVERATROL PO Take 500 mg by mouth daily.     Marland Kitchen Specialty Vitamins Products (BRAIN) TABS Take 1 tablet by mouth 2 (two) times daily. Natrol Cognium 100 mg    . Turmeric 500 MG CAPS Take 500 mg by mouth daily.    . vitamin B-12 (CYANOCOBALAMIN) 1000 MCG tablet Take 1,000 mcg by mouth daily.    . cholestyramine (QUESTRAN) 4 g packet Take 4 g by mouth every other day.     . escitalopram (LEXAPRO) 20 MG tablet Take 20 mg by mouth 2 (two) times daily. IN THE MORNING & WITH LUNCH    . HYDROcodone-acetaminophen (NORCO) 5-325 MG tablet Take 1-2 tablets by mouth every 6 (six) hours as needed for moderate pain. (Patient not taking: Reported on 10/02/2018) 20 tablet 0   No current facility-administered medications for this visit.     PAST MEDICAL HISTORY: Past Medical History:  Diagnosis Date  . Anxiety     . Cervical dystonia    TREATED BY NEUROLOGIST AT [facility]--  BOTOX INJECTIONS  . Closed fracture of left distal radius   . Colitis   . DDD (degenerative disc disease), cervical   . Depression   . History of pericarditis    1989  . IBS (irritable bowel syndrome)   . MVP (mitral valve prolapse)   . Osteoporosis   . Spondylosis of cervical spine   . Torticollis, spasmodic   . Urinary incontinence   . Wears glasses     PAST SURGICAL HISTORY: Past Surgical History:  Procedure Laterality Date  . CARDIAC CATHETERIZATION  04-05-2000  dr al little   normal LVF/  ef 60%/  no coronary disaease/  MVP with mild MR  . CATARACT EXTRACTION W/ INTRAOCULAR LENS  IMPLANT, BILATERAL Bilateral   . CATARACT EXTRACTION W/ INTRAOCULAR LENS IMPLANT Bilateral   . CLOSED REDUCTION RADIAL SHAFT Left 11/27/2014   Procedure: CLOSED REDUCTION DISTAL RADIUS WITH PINS;  Surgeon: Merlyn Albert  Everlena Cooper, MD;  Location: [facility];  Service: Orthopedics;  Laterality: Left;  . COLONOSCOPY W/ POLYPECTOMY  03-30-2001  . COLONOSCOPY WITH PROPOFOL N/A 01/06/2017   Procedure: COLONOSCOPY WITH PROPOFOL;  Surgeon: Jeani Hawking, MD;  Location: [facility];  Service: Endoscopy;  Laterality: N/A;  . DILATION AND CURETTAGE OF UTERUS  yrs ago  . ESOPHAGOGASTRODUODENOSCOPY (EGD) WITH PROPOFOL N/A 01/06/2017   Procedure: ESOPHAGOGASTRODUODENOSCOPY (EGD) WITH PROPOFOL;  Surgeon: Jeani Hawking, MD;  Location: [facility];  Service: Endoscopy;  Laterality: N/A;  . LAPAROSCOPIC CHOLECYSTECTOMY  04-27-2000  . PREAURICULAR CYST EXCISION Left 04/20/2018   Procedure: EXCISION PREAURICULAR CYST PEDIATRIC;  Surgeon: Osborn Coho, MD;  Location: [facility];  Service: ENT;  Laterality: Left;  Also 119[address]  . ROTATOR CUFF REPAIR Right 11/ 2000    FAMILY HISTORY: History reviewed. No pertinent family history.  SOCIAL HISTORY: Social History   Socioeconomic History  . Marital status: Married    Spouse name: Not on file  . Number of  children: Not on file  . Years of education: Not on file  . Highest education level: Not on file  Occupational History  . Not on file  Social Needs  . Financial resource strain: Not on file  . Food insecurity:    Worry: Not on file    Inability: Not on file  . Transportation needs:    Medical: Not on file    Non-medical: Not on file  Tobacco Use  . Smoking status: Former Smoker    Packs/day: 1.00    Years: 30.00    Pack years: 30.00    Types: Cigarettes    Last attempt to quit: 01/10/1989    Years since quitting: 29.7  . Smokeless tobacco: Never Used  Substance and Sexual Activity  . Alcohol use: No  . Drug use: No  . Sexual activity: Not on file  Lifestyle  . Physical activity:    Days per week: Not on file    Minutes per session: Not on file  . Stress: Not on file  Relationships  . Social connections:    Talks on phone: Not on file    Gets together: Not on file    Attends religious service: Not on file    Active member of club or organization: Not on file    Attends meetings of clubs or organizations: Not on file    Relationship status: Not on file  . Intimate partner violence:    Fear of current or ex partner: Not on file    Emotionally abused: Not on file    Physically abused: Not on file    Forced sexual activity: Not on file  Other Topics Concern  . Not on file  Social History Narrative  . Not on file     PHYSICAL EXAM   Vitals:   10/02/18 1412  BP: (!) 146/90  Pulse: 77  Weight: 85 lb 8 oz (38.8 kg)  Height: 5\' 4"  (1.626 m)    Not recorded      Body mass index is 14.68 kg/m.  PHYSICAL EXAMNIATION:  Gen: NAD, conversant, well nourised, obese, well groomed                     Cardiovascular: Regular rate rhythm, no peripheral edema, warm, nontender. Eyes: Conjunctivae clear without exudates or hemorrhage Neck: Supple, no carotid bruits. Pulmonary: Clear to auscultation bilaterally   NEUROLOGICAL EXAM:  MENTAL STATUS: She does not have  significant facial dystonic movement  when not talking, but has forceful grimace, tightening of the platysmus when she talks,  Anterocollis to almost 90 degree, left turn,mild left shoulder elevations, frequent facial muscle and platysmas muscle dystonic movement  Speech:    Speech is normal; fluent and spontaneous with normal comprehension.  Cognition:     Orientation to time, place and person     Normal recent and remote memory     Normal Attention span and concentration     Normal Language, naming, repeating,spontaneous speech     Fund of knowledge   CRANIAL NERVES: CN II: Visual fields are full to confrontation.  Pupils are round equal and briskly reactive to light. CN III, IV, VI: extraocular movement are normal. No ptosis. CN V: Facial sensation is intact to pinprick in all 3 divisions bilaterally. Corneal responses are intact.  CN VII: Face is symmetric with normal eye closure and smile. CN VIII: Hearing is normal to rubbing fingers CN IX, X: Palate elevates symmetrically. Phonation is normal. CN XI: Head turning and shoulder shrug are intact CN XII: Tongue is midline with normal movements and no atrophy.  MOTOR: There is no pronator drift of out-stretched arms. Muscle bulk and tone are normal. Muscle strength is normal.  REFLEXES: Reflexes are 2+ and symmetric at the biceps, triceps, knees, and ankles. Plantar responses are flexor.  SENSORY: Intact to light touch, pinprick, positional sensation and vibratory sensation are intact in fingers and toes.  COORDINATION: Rapid alternating movements and fine finger movements are intact. There is no dysmetria on finger-to-nose and heel-knee-shin.    GAIT/STANCE: Posture is normal. Gait is steady with normal steps, base, arm swing, and turning. Heel and toe walking are normal. Tandem gait is normal.  Romberg is absent.   DIAGNOSTIC DATA (LABS, IMAGING, TESTING) - I reviewed patient records, labs, notes, testing and imaging  myself where available.   ASSESSMENT AND PLAN  LATONDRA GEBHART is a 72 y.o. female    Spasmodic torticollis  Severe orofacial, and oral mandibular dystonia  She wants to continue her Botox injection at [facility] to have further evaluation to our office, will proceed with MRI of the brain, MRI of cervical spine, Datscan  Give her a trial of sinemet 25/100 mg 3 times daily     Levert Feinstein, M.D. Ph.D.  [facility] [address], [address] [place], [place] [address] Ph: [phone] Fax: [phone]  CC: Merri Brunette, MD

## 2018-10-03 ENCOUNTER — Telehealth: Payer: Self-pay | Admitting: Neurology

## 2018-10-03 NOTE — Telephone Encounter (Signed)
[facility] she is asking for a call back

## 2018-10-03 NOTE — Telephone Encounter (Signed)
Waiting for Patient to call me back.

## 2018-10-03 NOTE — Telephone Encounter (Signed)
Called Patient waiting on her to sign Consent .  DAT Scan.

## 2018-10-08 NOTE — Telephone Encounter (Signed)
Please call patient, if she did have swollen after sinemet, with no other identifiable trigger,   It is ok to ask to stop Sinemet

## 2018-10-08 NOTE — Addendum Note (Signed)
Addended by: Lilla ShookKIRKMAN, Romie Keeble C on: 10/08/2018 05:31 PM   Modules accepted: Orders

## 2018-10-08 NOTE — Telephone Encounter (Signed)
Called patient and left her a message to call me back.

## 2018-10-08 NOTE — Telephone Encounter (Addendum)
Spoke to patient - reports the symptoms presented shortly after starting Sinemet.  No other new meds started.  No new foods tried.  No new home products started (facial wash, etc).    Per vo by Dr. Terrace ArabiaYan, stop the medication.  She will further address other treatment options after the Datscan.  The patient is agreeable to this plan.

## 2018-10-08 NOTE — Telephone Encounter (Signed)
Patient thinks she is allergic to  carbidopa-levodopa (SINEMET IR) 25-100 MG tablet  She started on 10/03/2018 and on Friday she was feeling bad.   Patient states her lips swelling , Red face and she noticed her fingers at the tips were swollen.

## 2018-10-09 ENCOUNTER — Telehealth: Payer: Self-pay | Admitting: Neurology

## 2018-10-09 NOTE — Telephone Encounter (Signed)
Mailed Patient consent for DAT Scan . Patient will mail back to me signed.

## 2018-11-18 ENCOUNTER — Ambulatory Visit
Admission: RE | Admit: 2018-11-18 | Discharge: 2018-11-18 | Disposition: A | Discharge status: Home / Self Care | Payer: Medicare Other | Source: Ambulatory Visit | Attending: Neurology | Admitting: Neurology

## 2018-11-18 DIAGNOSIS — G249 Dystonia, unspecified: Secondary | ICD-10-CM

## 2018-11-20 ENCOUNTER — Telehealth: Payer: Self-pay | Admitting: *Deleted

## 2018-11-20 NOTE — Telephone Encounter (Signed)
LMOM with below MRI results.  Age related changes, nothing acute that needs immed. tx. Dr. Terrace Arabia will review in greater detail at next appt. She does not need to return this call unless she has questions/fim

## 2018-11-20 NOTE — Telephone Encounter (Signed)
-----   Message from Levert Feinstein, MD sent at 11/20/2018  8:30 AM EST ----- Please call patient for mild cervical degenerative changes, no evidence of spinal cord or nerve root compression

## 2018-11-20 NOTE — Telephone Encounter (Signed)
LMOM with below MRI results; age related, wear and tear type changes, but spinal cord and nerves look ok. Dr. Terrace Arabia will review in greater detail at next appt.  She does not need to return this call unless she has questions/fim

## 2018-11-20 NOTE — Telephone Encounter (Signed)
-----   Message from Levert Feinstein, MD sent at 11/20/2018  8:32 AM EST ----- Please call patient, MRI of the brain showed extensive confluent T2/flair hyperintensity foci in the pontine, hemisphere, most consistent with extensive chronic microvascular changes, generalized atrophy, I will review films with her at next follow-up visit.

## 2018-12-04 ENCOUNTER — Telehealth: Payer: Self-pay | Admitting: Neurology

## 2018-12-04 NOTE — Telephone Encounter (Signed)
Pt is asking for a call from RN to discuss her cancelling the appointment for the DAT scan

## 2018-12-04 NOTE — Telephone Encounter (Addendum)
Spoke to the patient.  She has an appt with Dr. Terrace Arabia on 12/06/2018.  She is going to discuss the DatScan with her at this appointment.

## 2018-12-04 NOTE — Telephone Encounter (Signed)
Left message requesting a return call.

## 2018-12-04 NOTE — Telephone Encounter (Signed)
Left second message requesting a return call. 

## 2018-12-06 ENCOUNTER — Encounter (HOSPITAL_COMMUNITY): Payer: Medicare Other

## 2018-12-06 ENCOUNTER — Encounter: Payer: Self-pay | Admitting: Neurology

## 2018-12-06 ENCOUNTER — Ambulatory Visit (INDEPENDENT_AMBULATORY_CARE_PROVIDER_SITE_OTHER): Payer: Medicare Other | Admitting: Neurology

## 2018-12-06 ENCOUNTER — Ambulatory Visit (HOSPITAL_COMMUNITY): Payer: Medicare Other

## 2018-12-06 ENCOUNTER — Telehealth: Payer: Self-pay | Admitting: Neurology

## 2018-12-06 ENCOUNTER — Encounter: Payer: Self-pay | Admitting: *Deleted

## 2018-12-06 VITALS — BP 140/82 | HR 85 | Ht 64.0 in | Wt 91.0 lb

## 2018-12-06 DIAGNOSIS — G249 Dystonia, unspecified: Secondary | ICD-10-CM | POA: Diagnosis not present

## 2018-12-06 DIAGNOSIS — I679 Cerebrovascular disease, unspecified: Secondary | ICD-10-CM

## 2018-12-06 NOTE — Telephone Encounter (Addendum)
DatScan was ordered in Nov 2019, she wants to be evaluated by her pcp first, before considering test.

## 2018-12-06 NOTE — Progress Notes (Addendum)
PATIENT: Dana [name] Tolbert DOB: 09/12/1946  Chief Complaint  Patient presents with  . Dystonia    She would like to review her cervical and brain MRI results.  She would also like to discuss having the DatScan.     HISTORICAL  Dana [name] Defrank is a 73 year old female, seen in request by her primary care physician Dr. 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  I have reviewed and summarized the referring note from the referring physician.  She had a past medical history of mitral valve prolapse, hyperlipidemia, nephrolithiasis, depression anxiety,  She started to have abnormal neck posturing since early 1990s, difficulty sleeping at nighttime, forceful head turning to the left side, this gradually came on after she fell on a flight of steps, she was seen by Dr. love, was diagnosed with dystonia, over the years, dystonia gradually spread to involving her facial muscles, she gradually developed facial grimace, was forced to retire from her job due to difficulty speaking, aberrant facial and oral mandibular muscle movement, was [facility], diagnosed with  spasmodic torticollis, severe orofacial, and oral mandibular dystonia, he had been receiving Botox a injection adjunctive with clonazepam 1 mg 3 times a day since 2001, most recent injection was on September 11, 2017.  188 units of onabobotulism toxin a was injected,   Right sternomastoid, left obliquus capitis inferioris 30 units each; left sternomastoid, posterior scalene, levator scapulae 20 units each; right and left middle scalene 10 units each; right and left orbicularis oculi, right and left platysma 8 units each; right and left corrugator, right and left zygomaticus 2 units each; right and left depressor anguli oris 4 units each.  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, was treated with Provigil 100 mg twice a day, she also has had frequent diarrhea, was diagnosed with colitis,   Since 2018, she also noticed difficulty using her hands open bottles, abnormal hand posturing, joint pain, frequent bladder infections since August 2019, trouble walking, dizziness, leg swelling,   Laboratory evaluations in August 2019, normal CMP, TSH 0.78, CBC hemoglobin of 12.5,  UPDATE Dec 06 2018: She tried Sinemet 25/100 tid for one day, complains of increased shakiness, is no longer taking it, she is taking Provigil 200 mg daily for excessive fatigue, sleepiness,  Echocardiogram August 2019 showed ejection fraction 65 to 70%, wall motion was normal.  I was able to review her most recent injection note by Dr. Jeremy JohannJanice Massey in June 2019, used 188 units of Botox a, right sternocleidomastoid 30 units, left oblique capitis inferior 30 units, left sternocleidomastoid 20 units, posterior scalene 20 units, levator scapula 20 units, right and left medial scalene 10 units each, right and left orbicularis oris, right and left platysmas 8 units each right and left corrugate, right and left zygomatic major 2 unit each, right and left depressor anguli oris 4 unit each,  Patient desires to continue her Botox a injection through our office  We personally reviewed MRI of brain in January 2020, extensive confluent T2/flair hyperintensity foci in the pons, supratentorium small vessel disease, basal ganglion consistent with small vessel disease mild generalized atrophy, no acute abnormality.  MRI of cervical spine multilevel degenerative changes, no significant  foraminal and canal stenosis.  REVIEW OF SYSTEMS: Full 14 system review of systems performed and notable only for fatigue, weight loss, swelling legs,  blurred vision, joint pain, swelling, cramps, aching muscles, headache, numbness, weakness, sleepiness, difficulty swallowing, dizziness, tremor, depression, anxiety, too much sleep, decreased energy, change in appetite All other review of systems were negative.  ALLERGIES: Allergies  Allergen Reactions  . Penicillins Rash    PATIENT HAS HAD A PCN REACTION WITH IMMEDIATE RASH, FACIAL/TONGUE/THROAT SWELLING, SOB, OR LIGHTHEADEDNESS WITH HYPOTENSION:  #  #  YES  #  #  Has patient had a PCN reaction causing severe rash involving mucus membranes or skin necrosis: NO Has patient had a PCN reaction that required hospitalization NO Has patient had a PCN reaction occurring within the last 10 years: NO  . Sinemet [Carbidopa-Levodopa] Other (See Comments)    Reports lips/fingertips swelling, red face.  . Hydrocodone Itching  . Lactose Intolerance (Gi) Diarrhea    ANY MILK PRODUCTS  . Reclast [Zoledronic Acid] Rash    Rash   . Sulfa Antibiotics Rash    HOME MEDICATIONS: Current Outpatient Medications  Medication Sig Dispense Refill  . Ascorbic Acid (VITAMIN C) 1000 MG tablet Take 1,000 mg by mouth daily.     Marland Kitchen. bismuth subsalicylate (PEPTO BISMOL) 262 MG/15ML suspension Take 30 mLs by mouth as needed for indigestion or diarrhea or loose stools.     Marland Kitchen. buPROPion (WELLBUTRIN XL) 150 MG 24 hr tablet Take 150 mg by mouth daily.  2  . Cholecalciferol (VITAMIN D3) 2000 units TABS Take 6,000 Units by mouth daily.    . cholestyramine (QUESTRAN) 4 g packet Take 4 g by mouth every other day.     . clonazePAM (KLONOPIN) 1 MG tablet Take 1 mg by mouth 3 (three) times daily.     . COLLAGEN PO Take 3 capsules by mouth 2 (two) times daily. In the morning & in the afternoon.    . escitalopram (LEXAPRO) 20 MG tablet Take 20 mg by mouth 2 (two) times daily. IN THE MORNING & WITH LUNCH    . Ginger, Zingiber officinalis, (GINGER ROOT) 550 MG CAPS Take 1,100 mg by mouth daily.    . Glucosamine-Chondroitin (COSAMIN DS  PO) Take 3 tablets by mouth daily.    Marland Kitchen. HYDROcodone-acetaminophen (NORCO) 5-325 MG tablet Take 1-2 tablets by mouth every 6 (six) hours as needed for moderate pain. (Patient not taking: Reported on 10/02/2018) 20 tablet 0  . Krill Oil 500 MG CAPS Take 500 mg by mouth daily.    . modafinil (PROVIGIL) 200 MG tablet Take 100 mg by mouth 2 (two) times daily.  0  . Multiple Vitamin (MULTIVITAMIN WITH MINERALS) TABS tablet Take 1 tablet by mouth daily. Women's Multivitamin    . OnabotulinumtoxinA (BOTOX IJ) Inject 1 Dose as directed every 3 (three) months. Botox Injection for Dystonia Admin[facility] at [facility]    . Polyethyl Glycol-Propyl Glycol (SYSTANE) 0.4-0.3 % SOLN Place 1 drop into both eyes 2 (two) times daily.    Marland Kitchen. RESVERATROL PO Take 500 mg by mouth daily.     Marland Kitchen. Specialty Vitamins Products (BRAIN) TABS Take 1 tablet by mouth 2 (two) times daily. Natrol Cognium 100 mg    . Turmeric 500 MG CAPS Take 500 mg by mouth daily.    . vitamin B-12 (CYANOCOBALAMIN) 1000 MCG tablet Take 1,000 mcg by mouth daily.     No current facility-administered medications for this visit.     PAST MEDICAL HISTORY: Past  Medical History:  Diagnosis Date  . Anxiety   . Cervical dystonia    TREATED BY NEUROLOGIST AT [facility]--  BOTOX INJECTIONS  . Closed fracture of left distal radius   . Colitis   . DDD (degenerative disc disease), cervical   . Depression   . History of pericarditis    1989  . IBS (irritable bowel syndrome)   . MVP (mitral valve prolapse)   . Osteoporosis   . Spondylosis of cervical spine   . Torticollis, spasmodic   . Urinary incontinence   . Wears glasses     PAST SURGICAL HISTORY: Past Surgical History:  Procedure Laterality Date  . CARDIAC CATHETERIZATION  04-05-2000  dr al little   normal LVF/  ef 60%/  no coronary disaease/  MVP with mild MR  . CATARACT EXTRACTION W/ INTRAOCULAR LENS  IMPLANT, BILATERAL Bilateral   . CATARACT EXTRACTION W/ INTRAOCULAR LENS IMPLANT Bilateral     . CLOSED REDUCTION RADIAL SHAFT Left 11/27/2014   Procedure: CLOSED REDUCTION DISTAL RADIUS WITH PINS;  Surgeon: [name], MD;  Location: [facility];  Service: Orthopedics;  Laterality: Left;  . COLONOSCOPY W/ POLYPECTOMY  03-30-2001  . COLONOSCOPY WITH PROPOFOL N/A 01/06/2017   Procedure: COLONOSCOPY WITH PROPOFOL;  Surgeon: Jeani Hawking, MD;  Location: [facility];  Service: Endoscopy;  Laterality: N/A;  . DILATION AND CURETTAGE OF UTERUS  yrs ago  . ESOPHAGOGASTRODUODENOSCOPY (EGD) WITH PROPOFOL N/A 01/06/2017   Procedure: ESOPHAGOGASTRODUODENOSCOPY (EGD) WITH PROPOFOL;  Surgeon: Jeani Hawking, MD;  Location: [facility];  Service: Endoscopy;  Laterality: N/A;  . LAPAROSCOPIC CHOLECYSTECTOMY  04-27-2000  . PREAURICULAR CYST EXCISION Left 04/20/2018   Procedure: EXCISION PREAURICULAR CYST PEDIATRIC;  Surgeon: Osborn Coho, MD;  Location: [facility] OR;  Service: ENT;  Laterality: Left;  Also [id number]  . ROTATOR CUFF REPAIR Right 11/ 2000    FAMILY HISTORY: No family history on file.  SOCIAL HISTORY: Social History   Socioeconomic History  . Marital status: Married    Spouse name: Not on file  . Number of children: Not on file  . Years of education: Not on file  . Highest education level: Not on file  Occupational History  . Not on file  Social Needs  . Financial resource strain: Not on file  . Food insecurity:    Worry: Not on file    Inability: Not on file  . Transportation needs:    Medical: Not on file    Non-medical: Not on file  Tobacco Use  . Smoking status: Former Smoker    Packs/day: 1.00    Years: 30.00    Pack years: 30.00    Types: Cigarettes    Last attempt to quit: 01/10/1989    Years since quitting: 29.9  . Smokeless tobacco: Never Used  Substance and Sexual Activity  . Alcohol use: No  . Drug use: No  . Sexual activity: Not on file  Lifestyle  . Physical activity:    Days per week: Not on file    Minutes per session: Not on file  .  Stress: Not on file  Relationships  . Social connections:    Talks on phone: Not on file    Gets together: Not on file    Attends religious service: Not on file    Active member of club or organization: Not on file    Attends meetings of clubs or organizations: Not on file    Relationship status: Not on file  . Intimate partner  violence:    Fear of current or ex partner: Not on file    Emotionally abused: Not on file    Physically abused: Not on file    Forced sexual activity: Not on file  Other Topics Concern  . Not on file  Social History Narrative  . Not on file     PHYSICAL EXAM   There were no vitals filed for this visit.  Not recorded      There is no height or weight on file to calculate BMI.  PHYSICAL EXAMNIATION:  Gen: NAD, conversant, well nourised, obese, well groomed                     Cardiovascular: Regular rate rhythm, no peripheral edema, warm, nontender. Eyes: Conjunctivae clear without exudates or hemorrhage Neck: Supple, no carotid bruits. Pulmonary: Clear to auscultation bilaterally   NEUROLOGICAL EXAM:  MENTAL STATUS: She does not have significant facial dystonic movement when not talking, but has forceful grimace, tightening of the platysmus when she talks,  Anterocollis to almost 90 degree, left turn,mild left shoulder elevations, frequent facial muscle and platysmas muscle dystonic movement  Speech:    Speech is normal; fluent and spontaneous with normal comprehension.  Cognition:     Orientation to time, place and person     Normal recent and remote memory     Normal Attention span and concentration     Normal Language, naming, repeating,spontaneous speech     Fund of knowledge   CRANIAL NERVES: CN II: Visual fields are full to confrontation.  Pupils are round equal and briskly reactive to light. CN III, IV, VI: extraocular movement are normal. No ptosis. CN V: Facial sensation is intact to pinprick in all 3 divisions bilaterally.  Corneal responses are intact.  CN VII: Face is symmetric with normal eye closure and smile. CN VIII: Hearing is normal to rubbing fingers CN IX, X: Palate elevates symmetrically. Phonation is normal. CN XI: Head turning and shoulder shrug are intact CN XII: Tongue is midline with normal movements and no atrophy.  MOTOR: There is no pronator drift of out-stretched arms. Muscle bulk and tone are normal. Muscle strength is normal.  REFLEXES: Reflexes are 2+ and symmetric at the biceps, triceps, knees, and ankles. Plantar responses are flexor.  SENSORY: Intact to light touch, pinprick, positional sensation and vibratory sensation are intact in fingers and toes.  COORDINATION: Rapid alternating movements and fine finger movements are intact. There is no dysmetria on finger-to-nose and heel-knee-shin.    GAIT/STANCE: Posture is normal. Gait is steady with normal steps, base, arm swing, and turning. Heel and toe walking are normal. Tandem gait is normal.  Romberg is absent.   DIAGNOSTIC DATA (LABS, IMAGING, TESTING) - I reviewed patient records, labs, notes, testing and imaging myself where available.   ASSESSMENT AND PLAN  KORTNEE BAS is a 73 y.[name]. female    Spasmodic torticollis  Severe orofacial, and oral mandibular dystonia  She wants to continue her Botox injection at our office,.  Preauthorization started today,     Levert Feinstein, M.D. Ph.D.  [facility] [address], [address] [place], [place] [address] Ph: [phone] Fax: [phone]  CC: Merri Brunette, MD

## 2018-12-11 ENCOUNTER — Ambulatory Visit (HOSPITAL_COMMUNITY)
Admission: RE | Admit: 2018-12-11 | Discharge: 2018-12-11 | Disposition: A | Discharge status: Home / Self Care | Payer: Medicare Other | Source: Ambulatory Visit | Attending: Neurology | Admitting: Neurology

## 2018-12-11 DIAGNOSIS — I679 Cerebrovascular disease, unspecified: Secondary | ICD-10-CM | POA: Insufficient documentation

## 2018-12-11 DIAGNOSIS — G249 Dystonia, unspecified: Secondary | ICD-10-CM | POA: Diagnosis not present

## 2018-12-11 NOTE — Progress Notes (Signed)
Carotid artery duplex has been completed. Preliminary results can be found in CV Proc through chart review.   12/11/18 1:37 PM Olen Cordial RVT

## 2019-01-07 ENCOUNTER — Telehealth: Payer: Self-pay | Admitting: Neurology

## 2019-01-07 NOTE — Telephone Encounter (Signed)
Patient was called for DAT scan and per [facility] when the called she did not not want to have test.

## 2019-02-06 DEATH — deceased

## 2019-03-04 ENCOUNTER — Other Ambulatory Visit: Payer: Self-pay

## 2019-03-04 ENCOUNTER — Encounter (HOSPITAL_COMMUNITY): Payer: Self-pay

## 2019-03-04 ENCOUNTER — Inpatient Hospital Stay (HOSPITAL_COMMUNITY)
Admission: EM | Admit: 2019-03-04 | Discharge: 2019-04-08 | DRG: 329 | Disposition: E | Payer: Medicare Other | Attending: General Surgery | Admitting: General Surgery

## 2019-03-04 DIAGNOSIS — F419 Anxiety disorder, unspecified: Secondary | ICD-10-CM | POA: Diagnosis present

## 2019-03-04 DIAGNOSIS — R140 Abdominal distension (gaseous): Secondary | ICD-10-CM

## 2019-03-04 DIAGNOSIS — R410 Disorientation, unspecified: Secondary | ICD-10-CM

## 2019-03-04 DIAGNOSIS — E871 Hypo-osmolality and hyponatremia: Secondary | ICD-10-CM | POA: Diagnosis not present

## 2019-03-04 DIAGNOSIS — M810 Age-related osteoporosis without current pathological fracture: Secondary | ICD-10-CM | POA: Diagnosis present

## 2019-03-04 DIAGNOSIS — K562 Volvulus: Principal | ICD-10-CM | POA: Diagnosis present

## 2019-03-04 DIAGNOSIS — Z789 Other specified health status: Secondary | ICD-10-CM

## 2019-03-04 DIAGNOSIS — K567 Ileus, unspecified: Secondary | ICD-10-CM | POA: Diagnosis not present

## 2019-03-04 DIAGNOSIS — Z87891 Personal history of nicotine dependence: Secondary | ICD-10-CM

## 2019-03-04 DIAGNOSIS — R32 Unspecified urinary incontinence: Secondary | ICD-10-CM | POA: Diagnosis not present

## 2019-03-04 DIAGNOSIS — Z20828 Contact with and (suspected) exposure to other viral communicable diseases: Secondary | ICD-10-CM | POA: Diagnosis present

## 2019-03-04 DIAGNOSIS — Z79899 Other long term (current) drug therapy: Secondary | ICD-10-CM

## 2019-03-04 DIAGNOSIS — Z978 Presence of other specified devices: Secondary | ICD-10-CM

## 2019-03-04 DIAGNOSIS — K589 Irritable bowel syndrome without diarrhea: Secondary | ICD-10-CM | POA: Diagnosis present

## 2019-03-04 DIAGNOSIS — F329 Major depressive disorder, single episode, unspecified: Secondary | ICD-10-CM | POA: Diagnosis present

## 2019-03-04 DIAGNOSIS — Z66 Do not resuscitate: Secondary | ICD-10-CM | POA: Diagnosis not present

## 2019-03-04 DIAGNOSIS — J189 Pneumonia, unspecified organism: Secondary | ICD-10-CM

## 2019-03-04 DIAGNOSIS — G243 Spasmodic torticollis: Secondary | ICD-10-CM | POA: Diagnosis present

## 2019-03-04 DIAGNOSIS — J9601 Acute respiratory failure with hypoxia: Secondary | ICD-10-CM | POA: Diagnosis not present

## 2019-03-04 DIAGNOSIS — E46 Unspecified protein-calorie malnutrition: Secondary | ICD-10-CM | POA: Diagnosis present

## 2019-03-04 DIAGNOSIS — Z681 Body mass index (BMI) 19 or less, adult: Secondary | ICD-10-CM

## 2019-03-04 DIAGNOSIS — Z515 Encounter for palliative care: Secondary | ICD-10-CM | POA: Diagnosis not present

## 2019-03-04 DIAGNOSIS — E875 Hyperkalemia: Secondary | ICD-10-CM | POA: Diagnosis not present

## 2019-03-04 DIAGNOSIS — T17908A Unspecified foreign body in respiratory tract, part unspecified causing other injury, initial encounter: Secondary | ICD-10-CM

## 2019-03-04 DIAGNOSIS — E739 Lactose intolerance, unspecified: Secondary | ICD-10-CM | POA: Diagnosis present

## 2019-03-04 DIAGNOSIS — E876 Hypokalemia: Secondary | ICD-10-CM | POA: Diagnosis present

## 2019-03-04 DIAGNOSIS — D62 Acute posthemorrhagic anemia: Secondary | ICD-10-CM | POA: Diagnosis not present

## 2019-03-04 DIAGNOSIS — K661 Hemoperitoneum: Secondary | ICD-10-CM | POA: Diagnosis present

## 2019-03-04 DIAGNOSIS — R188 Other ascites: Secondary | ICD-10-CM | POA: Diagnosis present

## 2019-03-04 DIAGNOSIS — R109 Unspecified abdominal pain: Secondary | ICD-10-CM | POA: Diagnosis present

## 2019-03-04 DIAGNOSIS — D72829 Elevated white blood cell count, unspecified: Secondary | ICD-10-CM

## 2019-03-04 DIAGNOSIS — I10 Essential (primary) hypertension: Secondary | ICD-10-CM | POA: Diagnosis present

## 2019-03-04 DIAGNOSIS — J690 Pneumonitis due to inhalation of food and vomit: Secondary | ICD-10-CM | POA: Diagnosis not present

## 2019-03-04 DIAGNOSIS — Z7982 Long term (current) use of aspirin: Secondary | ICD-10-CM

## 2019-03-04 LAB — CBC
HCT: 44.3 % (ref 36.0–46.0)
Hemoglobin: 14.9 g/dL (ref 12.0–15.0)
MCH: 33.9 pg (ref 26.0–34.0)
MCHC: 33.6 g/dL (ref 30.0–36.0)
MCV: 100.7 fL — ABNORMAL HIGH (ref 80.0–100.0)
Platelets: 201 10*3/uL (ref 150–400)
RBC: 4.4 MIL/uL (ref 3.87–5.11)
RDW: 12.4 % (ref 11.5–15.5)
WBC: 18 10*3/uL — ABNORMAL HIGH (ref 4.0–10.5)
nRBC: 0 % (ref 0.0–0.2)

## 2019-03-04 MED ORDER — ONDANSETRON HCL 4 MG/2ML IJ SOLN
4.0000 mg | Freq: Once | INTRAMUSCULAR | Status: AC | PRN
  Administered 2019-03-04: 23:00:00 4 mg via INTRAVENOUS
  Filled 2019-03-04: qty 2

## 2019-03-04 MED ORDER — MORPHINE SULFATE (PF) 4 MG/ML IV SOLN
4.0000 mg | Freq: Once | INTRAVENOUS | Status: AC
  Administered 2019-03-05: 4 mg via INTRAVENOUS
  Filled 2019-03-04: qty 1

## 2019-03-04 NOTE — ED Provider Notes (Signed)
[facility] EMERGENCY DEPARTMENT Provider Note   CSN: [id number] Arrival date & time: 02/08/2019  2248    History   Chief Complaint Chief Complaint  Patient presents with  . Abdominal Pain    HPI Dana [name] is a 73 y.o. female.     HPI  This is a 73 year old female with a history of degenerative disc disease, colitis who presents with abdominal pain and vomiting.  Patient reports she has had ongoing and worsening lower abdominal pain that radiates from one side to the other.  Onset of pain was this morning.  She states that with her history of colitis she has intermittent pain.  She reports nonbilious, nonbloody emesis.  Last normal bowel movement was on Saturday.  She did take a fleets enema earlier today which did have some results.  No blood in her vomit or stool.  She rates her pain at 10 out of 10.  She denies any dysuria or hematuria.  She does report recent treatment for urinary tract infection.  She denies fevers, chest pain, shortness of breath.  Past Medical History:  Diagnosis Date  . Anxiety   . Cervical dystonia    TREATED BY NEUROLOGIST AT [facility]--  BOTOX INJECTIONS  . Closed fracture of left distal radius   . Colitis   . DDD (degenerative disc disease), cervical   . Depression   . History of pericarditis    1989  . IBS (irritable bowel syndrome)   . MVP (mitral valve prolapse)   . Osteoporosis   . Spondylosis of cervical spine   . Torticollis, spasmodic   . Urinary incontinence   . Wears glasses     Patient Active Problem List   Diagnosis Date Noted  . Small vessel disease, cerebrovascular 12/06/2018  . 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 Neck pain 10/02/2018  . Lesion of skin of left ear 04/20/2018    Past Surgical History:  Procedure Laterality Date  . CARDIAC CATHETERIZATION  04-05-2000  dr al little   normal LVF/  ef 60%/  no coronary disaease/  MVP with mild MR  . CATARACT EXTRACTION W/ INTRAOCULAR LENS  IMPLANT,  BILATERAL Bilateral   . CATARACT EXTRACTION W/ INTRAOCULAR LENS IMPLANT Bilateral   . CLOSED REDUCTION RADIAL SHAFT Left 11/27/2014   Procedure: CLOSED REDUCTION DISTAL RADIUS WITH PINS;  Surgeon: [name], MD;  Location: [facility];  Service: Orthopedics;  Laterality: Left;  . COLONOSCOPY W/ POLYPECTOMY  03-30-2001  . COLONOSCOPY WITH PROPOFOL N/A 01/06/2017   Procedure: COLONOSCOPY WITH PROPOFOL;  Surgeon: Jeani Hawking, MD;  Location: [facility];  Service: Endoscopy;  Laterality: N/A;  . DILATION AND CURETTAGE OF UTERUS  yrs ago  . ESOPHAGOGASTRODUODENOSCOPY (EGD) WITH PROPOFOL N/A 01/06/2017   Procedure: ESOPHAGOGASTRODUODENOSCOPY (EGD) WITH PROPOFOL;  Surgeon: Jeani Hawking, MD;  Location: [facility];  Service: Endoscopy;  Laterality: N/A;  . LAPAROSCOPIC CHOLECYSTECTOMY  04-27-2000  . PREAURICULAR CYST EXCISION Left 04/20/2018   Procedure: EXCISION PREAURICULAR CYST PEDIATRIC;  Surgeon: Osborn Coho, MD;  Location: [facility] OR;  Service: ENT;  Laterality: Left;  Also [id number]  . ROTATOR CUFF REPAIR Right 11/ 2000     OB History   No obstetric history on file.      Home Medications    Prior to Admission medications   Medication Sig Start Date End Date Taking? Authorizing Provider  Ascorbic Acid (VITAMIN C) 1000 MG tablet Take 1,000 mg by mouth daily.     [provider]  aspirin EC 81 MG tablet Take 81 mg by mouth daily.    [provider]  bismuth subsalicylate (PEPTO BISMOL) 262 MG/15ML suspension Take 30 mLs by mouth as needed for indigestion or diarrhea or loose stools.     [provider]  buPROPion (WELLBUTRIN XL) 150 MG 24 hr tablet Take 150 mg by mouth daily. 03/22/18   [provider]  Cholecalciferol (VITAMIN D3) 2000 units TABS Take 6,000 Units by mouth daily.    [provider]  clonazePAM (KLONOPIN) 1 MG tablet Take 1 mg by mouth 3 (three) times daily.     [provider]  COLLAGEN PO Take 3 capsules  by mouth 2 (two) times daily. In the morning & in the afternoon.    [provider]  escitalopram (LEXAPRO) 20 MG tablet Take 20 mg by mouth 2 (two) times daily. IN THE MORNING & WITH LUNCH    [provider]  Ginger, Zingiber officinalis, (GINGER ROOT) 550 MG CAPS Take 1,100 mg by mouth daily.    [provider]  Glucosamine-Chondroitin (COSAMIN DS PO) Take 3 tablets by mouth daily.    [provider]  HYDROcodone-acetaminophen (NORCO) 5-325 MG tablet Take 1-2 tablets by mouth every 6 (six) hours as needed for moderate pain. 04/20/18   Osborn Coho, MD  Boris Lown Oil 500 MG CAPS Take 500 mg by mouth daily.    [provider]  modafinil (PROVIGIL) 200 MG tablet Take 100 mg by mouth 2 (two) times daily. 04/05/18   [provider]  Multiple Vitamin (MULTIVITAMIN WITH MINERALS) TABS tablet Take 1 tablet by mouth daily. Women's Multivitamin    [provider]  OnabotulinumtoxinA (BOTOX IJ) Inject 1 Dose as directed every 3 (three) months. Botox Injection for Dystonia Administer at [facility], Historical, MD  Polyethyl Glycol-Propyl Glycol (SYSTANE) 0.4-0.3 % SOLN Place 1 drop into both eyes 2 (two) times daily.    [provider]  RESVERATROL PO Take 500 mg by mouth daily.     [provider]  Specialty Vitamins Products (BRAIN) TABS Take 1 tablet by mouth 2 (two) times daily. Natrol Cognium 100 mg    [provider]  Turmeric 500 MG CAPS Take 500 mg by mouth daily.    [provider]  vitamin B-12 (CYANOCOBALAMIN) 1000 MCG tablet Take 1,000 mcg by mouth daily.    [provider]    Family History History reviewed. No pertinent family history.  Social History Social History   Tobacco Use  . Smoking status: Former Smoker    Packs/day: 1.00    Years: 30.00    Pack years: 30.00    Types: Cigarettes    Last attempt to quit: 01/10/1989    Years since quitting: 30.1  .  Smokeless tobacco: Never Used  Substance Use Topics  . Alcohol use: No  . Drug use: No     Allergies   Penicillins; Sinemet [carbidopa-levodopa]; Hydrocodone; Lactose intolerance (gi); Reclast [zoledronic acid]; and Sulfa antibiotics   Review of Systems Review of Systems  Constitutional: Negative for fever.  Respiratory: Negative for shortness of breath.   Cardiovascular: Negative for chest pain.  Gastrointestinal: Positive for abdominal pain, constipation and vomiting. Negative for blood in stool, diarrhea and nausea.  Genitourinary: Negative for dysuria.  Skin: Negative for rash.  Neurological: Negative for headaches.  All other systems reviewed and are negative.    Physical Exam Updated Vital Signs BP (!) 165/95 (BP Location: Right Arm)  Pulse 71   Temp 98.5 F (36.9 C) (Oral)   Resp 18   Ht 1.626 m (5\' 4" )   Wt 42.2 kg   SpO2 97%   BMI 15.96 kg/m   Physical Exam Vitals signs and nursing note reviewed.  Constitutional:      Appearance: She is well-developed. She is not ill-appearing.  HENT:     Head: Normocephalic and atraumatic.  Eyes:     Pupils: Pupils are equal, round, and reactive to light.  Neck:     Musculoskeletal: Neck supple.  Cardiovascular:     Rate and Rhythm: Normal rate and regular rhythm.     Heart sounds: Normal heart sounds.  Pulmonary:     Effort: Pulmonary effort is normal. No respiratory distress.     Breath sounds: No wheezing.  Abdominal:     General: Bowel sounds are increased.     Palpations: Abdomen is soft.     Tenderness: There is abdominal tenderness in the right lower quadrant and suprapubic area. There is no right CVA tenderness or left CVA tenderness.     Hernia: No hernia is present.  Skin:    General: Skin is warm and dry.  Neurological:     Mental Status: She is alert and oriented to person, place, and time.      ED Treatments / Results  Labs (all labs ordered are listed, but only abnormal results are  displayed) Labs Reviewed  COMPREHENSIVE METABOLIC PANEL - Abnormal; Notable for the following components:      Result Value   Potassium 3.4 (*)    Chloride 94 (*)    Glucose, Bld 114 (*)    All other components within normal limits  CBC - Abnormal; Notable for the following components:   WBC 18.0 (*)    MCV 100.7 (*)    All other components within normal limits  URINALYSIS, ROUTINE W REFLEX MICROSCOPIC - Abnormal; Notable for the following components:   APPearance CLOUDY (*)    pH 9.0 (*)    Hgb urine dipstick SMALL (*)    Ketones, ur 5 (*)    Bacteria, UA RARE (*)    All other components within normal limits  LIPASE, BLOOD    EKG EKG Interpretation  Date/Time:  Monday March 04 2019 23:43:38 EDT Ventricular Rate:  76 PR Interval:    QRS Duration: 91 QT Interval:  465 QTC Calculation: 523 R Axis:   84 Text Interpretation:  Sinus rhythm Probable anterior infarct, age indeterminate Prolonged QT interval Confirmed by Ross MarcusHorton, Courtney ([id number]) on 07/24/19 11:46:06 PM   Radiology Ct Abdomen Pelvis W Contrast  Addendum Date: 03/02/2019   ADDENDUM REPORT: 02/19/2019 03:29 ADDENDUM: Study discussed by telephone with Dr. Ross MarcusOURTNEY HORTON on 02/23/2019 at 0322 hours. Electronically Signed   By: Odessa FlemingH  Hall M.D.   On: 02/12/2019 03:29   Result Date: 02/22/2019 CLINICAL DATA:  73 year old female with acute abdominal pain, nausea and vomiting. EXAM: CT ABDOMEN AND PELVIS WITH CONTRAST TECHNIQUE: Multidetector CT imaging of the abdomen and pelvis was performed using the standard protocol following bolus administration of intravenous contrast. CONTRAST:  100mL OMNIPAQUE IOHEXOL 300 MG/ML  SOLN COMPARISON:  CT Abdomen and Pelvis 08/01/2018 and earlier. FINDINGS: Lower chest: Centrilobular emphysema. Ectatic descending thoracic aorta. Otherwise negative. Hepatobiliary: Surgically absent gallbladder with increased intra and extrahepatic biliary ductal dilatation since 2019. Small volume perihepatic  free fluid. No discrete liver lesion. Pancreas: Negative, main pancreatic duct size remains normal. Spleen: Negative. Adrenals/Urinary Tract: Symmetric appearing renal  enhancement and contrast excretion. Negative adrenal glands. Unremarkable urinary bladder. Stomach/Bowel: Mild retained stool in the rectum. It appears the descending and sigmoid colon is completely decompressed. There is a moderately to severely dilated segment of large bowel in the central lower abdomen and pelvis (coronal image 39) with what appears to be an ileocecal valve (coronal image 42) and downstream volvulus at the junction with the ascending colon on coronal image 45. The ascending colon is then completely decompressed and mildly hyperenhancing. Mild-to-moderate contrast distension of the stomach. Contrast in jejunum and distal small bowel in the pelvis. Coronal image 43 depicts oral contrast just entering the abnormal cecum from the IC valve. Mildly dilated right abdominal small bowel containing gas and contrast. No free air. Vascular/Lymphatic: Aortoiliac calcified atherosclerosis. Major arterial structures are patent. Portal venous system is patent. Reproductive: Negative. Other: Small volume of simple appearing free fluid in the abdomen and pelvis. Musculoskeletal: No acute osseous abnormality identified. IMPRESSION: 1. 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 The ascending and downstream colon is completely decompressed. 2. Small volume of free fluid in the abdomen and pelvis. No free air. 3. New intra- and extrahepatic biliary ductal dilatation in the setting of chronic cholecystectomy. This is of uncertain etiology but might be secondary to #1. Electronically Signed: By: Odessa Fleming M.D. On: 22-Mar-2019 03:19   Dg Abdomen Acute W/chest  Result Date: 2019-03-22 CLINICAL DATA:  Initial evaluation for acute abdominal pain. EXAM: DG ABDOMEN ACUTE W/ 1V CHEST COMPARISON:  Prior CT from 08/01/2018. FINDINGS:  Cardiac and mediastinal silhouettes within normal limits. Aortic atherosclerosis. Lungs normally inflated. No focal infiltrates. No edema or effusion. No pneumothorax. Prominent gaseous distension of what likely represents the sigmoid colon seen within the lower mid abdomen/pelvis. This is dilated up to approximately 8.6 cm in diameter. No small bowel dilatation. No free air on upright view. No soft tissue mass identified. Cholecystectomy clips noted. Few small calcific densities overlying the right upper quadrant noted, non-specific, but of doubtful clinical significance. No other abnormal calcification. No acute osseous finding. IMPRESSION: 1. Prominent gaseous distension of what likely represents the sigmoid colon seen within the lower mid abdomen/pelvis. Finding could be related to a prominent gas-filled redundant sigmoid colon or possibly volvulus. No evidence for small bowel obstruction. 2. No active cardiopulmonary disease. 3. Aortic atherosclerosis. Electronically Signed   By: Rise Mu M.D.   On: Mar 22, 2019 00:50    Procedures Procedures (including critical care time)  Medications Ordered in ED Medications  ondansetron (ZOFRAN) injection 4 mg (4 mg Intravenous Given 02/09/2019 2329)  morphine 4 MG/ML injection 4 mg (4 mg Intravenous Given 2019-03-22 0016)  iohexol (OMNIPAQUE) 300 MG/ML solution 100 mL (100 mLs Intravenous Contrast Given 2019/03/22 0245)     Initial Impression / Assessment and Plan / ED Course  I have reviewed the triage vital signs and the nursing notes.  Pertinent labs & imaging results that were available during my care of the patient were reviewed by me and considered in my medical decision making (see chart for details).        Presents with abdominal pain and vomiting.  She is overall nontoxic-appearing and vital signs are reassuring.  She has hyperactive bowel sounds and tenderness over the lower abdomen.  She was given pain and nausea medication.  Lab work  obtained as well as a Radiographer, therapeutic.  X-rays concerning for possible volvulus.  CT scan obtained and shows a cecal volvulus, high-grade.  On recheck, she reports  persistent pain but is not actively vomiting.  General surgery consulted who will evaluate the patient.  Final Clinical Impressions(s) / ED Diagnoses   Final diagnoses:  Cecal volvulus [id number])    ED Discharge Orders    None       Shon Baton, MD 02/10/2019 [phone]

## 2019-03-04 NOTE — ED Triage Notes (Signed)
Via EMS for abdominal pain beginning this a.m. with Nausea and vominting x 4 pain is worse than childbirth, per patient.

## 2019-03-05 ENCOUNTER — Emergency Department (HOSPITAL_COMMUNITY): Payer: Medicare Other | Admitting: Certified Registered"

## 2019-03-05 ENCOUNTER — Emergency Department (HOSPITAL_COMMUNITY): Payer: Medicare Other

## 2019-03-05 ENCOUNTER — Encounter (HOSPITAL_COMMUNITY): Admission: EM | Disposition: E | Payer: Self-pay | Source: Home / Self Care

## 2019-03-05 ENCOUNTER — Encounter (HOSPITAL_COMMUNITY): Payer: Self-pay | Admitting: Certified Registered"

## 2019-03-05 DIAGNOSIS — R109 Unspecified abdominal pain: Secondary | ICD-10-CM | POA: Diagnosis present

## 2019-03-05 DIAGNOSIS — F419 Anxiety disorder, unspecified: Secondary | ICD-10-CM | POA: Diagnosis present

## 2019-03-05 DIAGNOSIS — R32 Unspecified urinary incontinence: Secondary | ICD-10-CM | POA: Diagnosis not present

## 2019-03-05 DIAGNOSIS — Z20828 Contact with and (suspected) exposure to other viral communicable diseases: Secondary | ICD-10-CM | POA: Diagnosis present

## 2019-03-05 DIAGNOSIS — K562 Volvulus: Secondary | ICD-10-CM | POA: Diagnosis present

## 2019-03-05 DIAGNOSIS — J690 Pneumonitis due to inhalation of food and vomit: Secondary | ICD-10-CM | POA: Diagnosis not present

## 2019-03-05 DIAGNOSIS — R188 Other ascites: Secondary | ICD-10-CM | POA: Diagnosis present

## 2019-03-05 DIAGNOSIS — E875 Hyperkalemia: Secondary | ICD-10-CM | POA: Diagnosis not present

## 2019-03-05 DIAGNOSIS — E739 Lactose intolerance, unspecified: Secondary | ICD-10-CM | POA: Diagnosis present

## 2019-03-05 DIAGNOSIS — Z515 Encounter for palliative care: Secondary | ICD-10-CM | POA: Diagnosis not present

## 2019-03-05 DIAGNOSIS — Z79899 Other long term (current) drug therapy: Secondary | ICD-10-CM | POA: Diagnosis not present

## 2019-03-05 DIAGNOSIS — E46 Unspecified protein-calorie malnutrition: Secondary | ICD-10-CM | POA: Diagnosis present

## 2019-03-05 DIAGNOSIS — Z66 Do not resuscitate: Secondary | ICD-10-CM | POA: Diagnosis not present

## 2019-03-05 DIAGNOSIS — D62 Acute posthemorrhagic anemia: Secondary | ICD-10-CM | POA: Diagnosis not present

## 2019-03-05 DIAGNOSIS — Z681 Body mass index (BMI) 19 or less, adult: Secondary | ICD-10-CM | POA: Diagnosis not present

## 2019-03-05 DIAGNOSIS — G243 Spasmodic torticollis: Secondary | ICD-10-CM | POA: Diagnosis present

## 2019-03-05 DIAGNOSIS — K661 Hemoperitoneum: Secondary | ICD-10-CM | POA: Diagnosis present

## 2019-03-05 DIAGNOSIS — E876 Hypokalemia: Secondary | ICD-10-CM | POA: Diagnosis present

## 2019-03-05 DIAGNOSIS — J9601 Acute respiratory failure with hypoxia: Secondary | ICD-10-CM | POA: Diagnosis not present

## 2019-03-05 DIAGNOSIS — E871 Hypo-osmolality and hyponatremia: Secondary | ICD-10-CM | POA: Diagnosis not present

## 2019-03-05 DIAGNOSIS — Z87891 Personal history of nicotine dependence: Secondary | ICD-10-CM | POA: Diagnosis not present

## 2019-03-05 DIAGNOSIS — K567 Ileus, unspecified: Secondary | ICD-10-CM | POA: Diagnosis not present

## 2019-03-05 DIAGNOSIS — F329 Major depressive disorder, single episode, unspecified: Secondary | ICD-10-CM | POA: Diagnosis present

## 2019-03-05 DIAGNOSIS — Z7982 Long term (current) use of aspirin: Secondary | ICD-10-CM | POA: Diagnosis not present

## 2019-03-05 DIAGNOSIS — J189 Pneumonia, unspecified organism: Secondary | ICD-10-CM | POA: Diagnosis not present

## 2019-03-05 DIAGNOSIS — M810 Age-related osteoporosis without current pathological fracture: Secondary | ICD-10-CM | POA: Diagnosis present

## 2019-03-05 HISTORY — PX: LAPAROTOMY: SHX154

## 2019-03-05 LAB — COMPREHENSIVE METABOLIC PANEL
ALT: 31 U/L (ref 0–44)
AST: 34 U/L (ref 15–41)
Albumin: 4.6 g/dL (ref 3.5–5.0)
Alkaline Phosphatase: 75 U/L (ref 38–126)
Anion gap: 15 (ref 5–15)
BUN: 9 mg/dL (ref 8–23)
CO2: 27 mmol/L (ref 22–32)
Calcium: 9.1 mg/dL (ref 8.9–10.3)
Chloride: 94 mmol/L — ABNORMAL LOW (ref 98–111)
Creatinine, Ser: 0.57 mg/dL (ref 0.44–1.00)
GFR calc Af Amer: >60 mL/min (ref >60)
GFR calc non Af Amer: >60 mL/min (ref >60)
Glucose, Bld: 114 mg/dL — ABNORMAL HIGH (ref 70–99)
Potassium: 3.4 mmol/L — ABNORMAL LOW (ref 3.5–5.1)
Sodium: 136 mmol/L (ref 135–145)
Total Bilirubin: 0.7 mg/dL (ref 0.3–1.2)
Total Protein: 7.3 g/dL (ref 6.5–8.1)

## 2019-03-05 LAB — LIPASE, BLOOD: Lipase: 32 U/L (ref 11–51)

## 2019-03-05 LAB — URINALYSIS, ROUTINE W REFLEX MICROSCOPIC
Bilirubin Urine: NEGATIVE
Glucose, UA: NEGATIVE mg/dL
Ketones, ur: 5 mg/dL — AB
Leukocytes,Ua: NEGATIVE
Nitrite: NEGATIVE
Protein, ur: NEGATIVE mg/dL
Specific Gravity, Urine: 1.006 (ref 1.005–1.030)
pH: 9 — ABNORMAL HIGH (ref 5.0–8.0)

## 2019-03-05 LAB — ABO/RH: ABO/RH(D): A POS

## 2019-03-05 SURGERY — LAPAROTOMY, EXPLORATORY
Anesthesia: General | Site: Abdomen

## 2019-03-05 MED ORDER — LIDOCAINE HCL (CARDIAC) PF 100 MG/5ML IV SOSY
PREFILLED_SYRINGE | INTRAVENOUS | Status: DC | PRN
  Administered 2019-03-05: 100 mg via INTRATRACHEAL

## 2019-03-05 MED ORDER — LIDOCAINE 2% (20 MG/ML) 5 ML SYRINGE
INTRAMUSCULAR | Status: AC
  Filled 2019-03-05: qty 5

## 2019-03-05 MED ORDER — ONDANSETRON HCL 4 MG/2ML IJ SOLN
INTRAMUSCULAR | Status: DC | PRN
  Administered 2019-03-05: 4 mg via INTRAVENOUS

## 2019-03-05 MED ORDER — SUCCINYLCHOLINE CHLORIDE 200 MG/10ML IV SOSY
PREFILLED_SYRINGE | INTRAVENOUS | Status: AC
  Filled 2019-03-05: qty 10

## 2019-03-05 MED ORDER — HYDROMORPHONE HCL 1 MG/ML IJ SOLN: 0.2500 mg | INTRAMUSCULAR | Status: DC | PRN

## 2019-03-05 MED ORDER — ROCURONIUM BROMIDE 100 MG/10ML IV SOLN
INTRAVENOUS | Status: DC | PRN
  Administered 2019-03-05: 30 mg via INTRAVENOUS

## 2019-03-05 MED ORDER — 0.9 % SODIUM CHLORIDE (POUR BTL) OPTIME
TOPICAL | Status: DC | PRN
  Administered 2019-03-05: 05:00:00 1000 mL

## 2019-03-05 MED ORDER — BUPROPION HCL ER (XL) 150 MG PO TB24
150.0000 mg | ORAL_TABLET | Freq: Every day | ORAL | Status: DC
  Administered 2019-03-06 – 2019-03-16 (×11): 150 mg via ORAL
  Filled 2019-03-05 (×11): qty 1

## 2019-03-05 MED ORDER — SODIUM CHLORIDE 0.9 % IV SOLN
INTRAVENOUS | Status: DC
  Administered 2019-03-05 – 2019-03-13 (×9): via INTRAVENOUS

## 2019-03-05 MED ORDER — PHENYLEPHRINE HCL (PRESSORS) 10 MG/ML IV SOLN
INTRAVENOUS | Status: DC | PRN
  Administered 2019-03-05: 40 ug via INTRAVENOUS

## 2019-03-05 MED ORDER — MIDAZOLAM HCL 2 MG/2ML IJ SOLN
INTRAMUSCULAR | Status: AC
  Filled 2019-03-05: qty 2

## 2019-03-05 MED ORDER — ROCURONIUM BROMIDE 50 MG/5ML IV SOSY
PREFILLED_SYRINGE | INTRAVENOUS | Status: AC
  Filled 2019-03-05: qty 5

## 2019-03-05 MED ORDER — METHOCARBAMOL 1000 MG/10ML IJ SOLN
500.0000 mg | Freq: Three times a day (TID) | INTRAVENOUS | Status: DC
  Administered 2019-03-05 – 2019-03-06 (×4): 500 mg via INTRAVENOUS
  Filled 2019-03-05 (×3): qty 5
  Filled 2019-03-05: qty 500
  Filled 2019-03-05 (×2): qty 5

## 2019-03-05 MED ORDER — DEXAMETHASONE SODIUM PHOSPHATE 10 MG/ML IJ SOLN
INTRAMUSCULAR | Status: DC | PRN
  Administered 2019-03-05: 10 mg via INTRAVENOUS

## 2019-03-05 MED ORDER — SUFENTANIL CITRATE 50 MCG/ML IV SOLN
INTRAVENOUS | Status: AC
  Filled 2019-03-05: qty 1

## 2019-03-05 MED ORDER — GENTAMICIN SULFATE 40 MG/ML IJ SOLN
5.0000 mg/kg | INTRAVENOUS | Status: AC
  Administered 2019-03-05: 21 mg via INTRAVENOUS
  Filled 2019-03-05: qty 5.25

## 2019-03-05 MED ORDER — DEXAMETHASONE SODIUM PHOSPHATE 10 MG/ML IJ SOLN
INTRAMUSCULAR | Status: AC
  Filled 2019-03-05: qty 1

## 2019-03-05 MED ORDER — LACTATED RINGERS IV SOLN
INTRAVENOUS | Status: DC | PRN
  Administered 2019-03-05 (×2): via INTRAVENOUS

## 2019-03-05 MED ORDER — SUFENTANIL CITRATE 50 MCG/ML IV SOLN
INTRAVENOUS | Status: DC | PRN
  Administered 2019-03-05 (×2): 10 ug via INTRAVENOUS

## 2019-03-05 MED ORDER — CLINDAMYCIN PHOSPHATE 900 MG/50ML IV SOLN
900.0000 mg | INTRAVENOUS | Status: AC
  Administered 2019-03-05: 900 mg via INTRAVENOUS
  Filled 2019-03-05: qty 50

## 2019-03-05 MED ORDER — SUCCINYLCHOLINE CHLORIDE 20 MG/ML IJ SOLN
INTRAMUSCULAR | Status: DC | PRN
  Administered 2019-03-05: 120 mg via INTRAVENOUS

## 2019-03-05 MED ORDER — ONDANSETRON HCL 4 MG/2ML IJ SOLN
4.0000 mg | Freq: Four times a day (QID) | INTRAMUSCULAR | Status: DC | PRN
  Administered 2019-03-09 – 2019-03-20 (×6): 4 mg via INTRAVENOUS
  Filled 2019-03-05 (×5): qty 2

## 2019-03-05 MED ORDER — MORPHINE SULFATE (PF) 2 MG/ML IV SOLN
2.0000 mg | INTRAVENOUS | Status: DC | PRN
  Administered 2019-03-05 – 2019-03-11 (×10): 2 mg via INTRAVENOUS
  Filled 2019-03-05 (×10): qty 1

## 2019-03-05 MED ORDER — SODIUM CHLORIDE (PF) 0.9 % IJ SOLN
INTRAMUSCULAR | Status: AC
  Filled 2019-03-05: qty 10

## 2019-03-05 MED ORDER — ACETAMINOPHEN 10 MG/ML IV SOLN
INTRAVENOUS | Status: AC
  Filled 2019-03-05: qty 100

## 2019-03-05 MED ORDER — PROPOFOL 10 MG/ML IV BOLUS
INTRAVENOUS | Status: DC | PRN
  Administered 2019-03-05: 100 mg via INTRAVENOUS

## 2019-03-05 MED ORDER — ENOXAPARIN SODIUM 40 MG/0.4ML ~~LOC~~ SOLN
40.0000 mg | SUBCUTANEOUS | Status: DC
  Administered 2019-03-06: 40 mg via SUBCUTANEOUS
  Filled 2019-03-05: qty 0.4

## 2019-03-05 MED ORDER — CLONAZEPAM 1 MG PO TABS
1.0000 mg | ORAL_TABLET | Freq: Three times a day (TID) | ORAL | Status: DC
  Administered 2019-03-06 – 2019-03-16 (×30): 1 mg via ORAL
  Filled 2019-03-05 (×30): qty 1

## 2019-03-05 MED ORDER — IOHEXOL 300 MG/ML  SOLN
100.0000 mL | Freq: Once | INTRAMUSCULAR | Status: AC | PRN
  Administered 2019-03-05: 100 mL via INTRAVENOUS

## 2019-03-05 MED ORDER — MEPERIDINE HCL 50 MG/ML IJ SOLN: 6.2500 mg | INTRAMUSCULAR | Status: DC | PRN

## 2019-03-05 MED ORDER — ESCITALOPRAM OXALATE 20 MG PO TABS
20.0000 mg | ORAL_TABLET | Freq: Two times a day (BID) | ORAL | Status: DC
  Administered 2019-03-06 – 2019-03-16 (×19): 20 mg via ORAL
  Filled 2019-03-05 (×20): qty 1

## 2019-03-05 MED ORDER — ACETAMINOPHEN 10 MG/ML IV SOLN
1000.0000 mg | Freq: Four times a day (QID) | INTRAVENOUS | Status: AC
  Administered 2019-03-05 (×4): 1000 mg via INTRAVENOUS
  Filled 2019-03-05 (×3): qty 100

## 2019-03-05 MED ORDER — SUGAMMADEX SODIUM 200 MG/2ML IV SOLN
INTRAVENOUS | Status: DC | PRN
  Administered 2019-03-05: 100 mg via INTRAVENOUS

## 2019-03-05 MED ORDER — EPHEDRINE 5 MG/ML INJ
INTRAVENOUS | Status: AC
  Filled 2019-03-05: qty 10

## 2019-03-05 MED ORDER — PROPOFOL 10 MG/ML IV BOLUS
INTRAVENOUS | Status: AC
  Filled 2019-03-05: qty 20

## 2019-03-05 MED ORDER — HYDRALAZINE HCL 20 MG/ML IJ SOLN
10.0000 mg | INTRAMUSCULAR | Status: DC | PRN
  Administered 2019-03-08: 10 mg via INTRAVENOUS
  Filled 2019-03-05 (×2): qty 1

## 2019-03-05 MED ORDER — MORPHINE SULFATE (PF) 4 MG/ML IV SOLN
4.0000 mg | Freq: Once | INTRAVENOUS | Status: AC
  Administered 2019-03-05: 04:00:00 4 mg via INTRAVENOUS
  Filled 2019-03-05: qty 1

## 2019-03-05 MED ORDER — ONDANSETRON HCL 4 MG/2ML IJ SOLN
INTRAMUSCULAR | Status: AC
  Filled 2019-03-05: qty 2

## 2019-03-05 MED ORDER — ONDANSETRON HCL 4 MG PO TABS
4.0000 mg | ORAL_TABLET | Freq: Four times a day (QID) | ORAL | Status: DC | PRN
  Administered 2019-03-12 – 2019-03-16 (×2): 4 mg via ORAL
  Filled 2019-03-05 (×3): qty 1

## 2019-03-05 MED ORDER — ONDANSETRON HCL 4 MG/2ML IJ SOLN: 4.0000 mg | Freq: Once | INTRAMUSCULAR | Status: DC | PRN

## 2019-03-05 SURGICAL SUPPLY — 59 items
BLADE CLIPPER SURG (BLADE) IMPLANT
CANISTER SUCT 3000ML PPV (MISCELLANEOUS) ×3 IMPLANT
CHLORAPREP W/TINT 26ML (MISCELLANEOUS) ×3 IMPLANT
COVER MAYO STAND STRL (DRAPES) ×2 IMPLANT
COVER SURGICAL LIGHT HANDLE (MISCELLANEOUS) ×3 IMPLANT
COVER WAND RF STERILE (DRAPES) ×3 IMPLANT
DRAPE LAPAROSCOPIC ABDOMINAL (DRAPES) ×3 IMPLANT
DRAPE LAPAROTOMY 100X72X124 (DRAPES) IMPLANT
DRAPE UTILITY XL STRL (DRAPES) ×2 IMPLANT
DRAPE WARM FLUID 44X44 (DRAPE) ×3 IMPLANT
DRSG OPSITE POSTOP 4X10 (GAUZE/BANDAGES/DRESSINGS) IMPLANT
DRSG OPSITE POSTOP 4X8 (GAUZE/BANDAGES/DRESSINGS) IMPLANT
ELECT BLADE 6.5 EXT (BLADE) IMPLANT
ELECT CAUTERY BLADE 6.4 (BLADE) IMPLANT
ELECT REM PT RETURN 9FT ADLT (ELECTROSURGICAL) ×3
ELECTRODE REM PT RTRN 9FT ADLT (ELECTROSURGICAL) ×1 IMPLANT
GAUZE SPONGE 4X4 12PLY STRL LF (GAUZE/BANDAGES/DRESSINGS) ×2 IMPLANT
GLOVE BIO SURGEON STRL SZ7 (GLOVE) ×7 IMPLANT
GLOVE BIOGEL PI IND STRL 6.5 (GLOVE) IMPLANT
GLOVE BIOGEL PI IND STRL 7.5 (GLOVE) ×1 IMPLANT
GLOVE BIOGEL PI INDICATOR 6.5 (GLOVE) ×8
GLOVE BIOGEL PI INDICATOR 7.5 (GLOVE) ×4
GOWN STRL REUS W/ TWL LRG LVL3 (GOWN DISPOSABLE) ×2 IMPLANT
GOWN STRL REUS W/TWL LRG LVL3 (GOWN DISPOSABLE) ×6
KIT BASIN OR (CUSTOM PROCEDURE TRAY) ×3 IMPLANT
KIT TURNOVER KIT B (KITS) ×3 IMPLANT
LIGASURE IMPACT 36 18CM CVD LR (INSTRUMENTS) ×2 IMPLANT
NS IRRIG 1000ML POUR BTL (IV SOLUTION) ×6 IMPLANT
PACK GENERAL/GYN (CUSTOM PROCEDURE TRAY) ×3 IMPLANT
PAD ABD 8X10 STRL (GAUZE/BANDAGES/DRESSINGS) ×2 IMPLANT
PAD ARMBOARD 7.5X6 YLW CONV (MISCELLANEOUS) ×3 IMPLANT
PENCIL SMOKE EVACUATOR (MISCELLANEOUS) ×5 IMPLANT
RELOAD PROXIMATE 75MM BLUE (ENDOMECHANICALS) ×9 IMPLANT
RELOAD STAPLE 75 3.8 BLU REG (ENDOMECHANICALS) IMPLANT
RELOAD STAPLER LINE PROX 60 GR (STAPLE) ×1 IMPLANT
SPECIMEN JAR LARGE (MISCELLANEOUS) IMPLANT
SPONGE LAP 18X18 RF (DISPOSABLE) IMPLANT
STAPLER PROXIMATE 75MM BLUE (STAPLE) ×2 IMPLANT
STAPLER RELOAD LINE PROX 60 GR (STAPLE) ×3
STAPLER RELOADABLE 60 GRN THCK (STAPLE) IMPLANT
STAPLER VISISTAT 35W (STAPLE) ×3 IMPLANT
SUCTION POOLE TIP (SUCTIONS) ×3 IMPLANT
SUT PDS AB 1 CTX 36 (SUTURE) ×4 IMPLANT
SUT PDS AB 1 TP1 96 (SUTURE) ×6 IMPLANT
SUT SILK 2 0 (SUTURE) ×3
SUT SILK 2 0 SH CR/8 (SUTURE) ×5 IMPLANT
SUT SILK 2-0 18XBRD TIE 12 (SUTURE) ×1 IMPLANT
SUT SILK 3 0 (SUTURE) ×3
SUT SILK 3 0 SH CR/8 (SUTURE) ×5 IMPLANT
SUT SILK 3-0 18XBRD TIE 12 (SUTURE) ×1 IMPLANT
SUT VIC AB 3-0 SH 27 (SUTURE)
SUT VIC AB 3-0 SH 27X BRD (SUTURE) IMPLANT
SUT VIC AB 4-0 SH 27 (SUTURE) ×3
SUT VIC AB 4-0 SH 27XBRD (SUTURE) IMPLANT
TAPE CLOTH SURG 6X10 WHT LF (GAUZE/BANDAGES/DRESSINGS) ×2 IMPLANT
TOWEL OR 17X26 10 PK STRL BLUE (TOWEL DISPOSABLE) ×3 IMPLANT
TRAY FOLEY MTR SLVR 14FR STAT (SET/KITS/TRAYS/PACK) ×2 IMPLANT
TRAY FOLEY MTR SLVR 16FR STAT (SET/KITS/TRAYS/PACK) ×3 IMPLANT
YANKAUER SUCT BULB TIP NO VENT (SUCTIONS) IMPLANT

## 2019-03-05 NOTE — Anesthesia Preprocedure Evaluation (Signed)
Anesthesia Evaluation  Patient identified by MRN, date of birth, ID band Patient awake    Reviewed: Allergy & Precautions, NPO status , Patient's Chart, lab work & pertinent test results  Airway Mallampati: II  TM Distance: >3 FB Neck ROM: Full    Dental  (+) Dental Advisory Given, Teeth Intact   Pulmonary former smoker,    breath sounds clear to auscultation       Cardiovascular negative cardio ROS   Rhythm:Regular Rate:Normal     Neuro/Psych PSYCHIATRIC DISORDERS Anxiety Depression dystonia  Neuromuscular disease    GI/Hepatic negative GI ROS, Neg liver ROS,   Endo/Other  negative endocrine ROS  Renal/GU negative Renal ROS     Musculoskeletal  (+) Arthritis ,   Abdominal   Peds  Hematology negative hematology ROS (+)   Anesthesia Other Findings   Reproductive/Obstetrics                             Anesthesia Physical  Anesthesia Plan  ASA: III and emergent  Anesthesia Plan: General   Post-op Pain Management:    Induction: Intravenous, Rapid sequence and Cricoid pressure planned  PONV Risk Score and Plan: 4 or greater and Ondansetron, Dexamethasone and Treatment may vary due to age or medical condition  Airway Management Planned: Oral ETT  Additional Equipment: None  Intra-op Plan:   Post-operative Plan: Possible Post-op intubation/ventilation  Informed Consent: I have reviewed the patients History and Physical, chart, labs and discussed the procedure including the risks, benefits and alternatives for the proposed anesthesia with the patient or authorized representative who has indicated his/her understanding and acceptance.     Dental advisory given  Plan Discussed with: CRNA  Anesthesia Plan Comments:         Anesthesia Quick Evaluation

## 2019-03-05 NOTE — Anesthesia Procedure Notes (Signed)
Procedure Name: Intubation Date/Time: 02/24/2019 5:19 AM Performed by: Claris Che, CRNA Pre-anesthesia Checklist: Patient identified, Emergency Drugs available, Suction available, Patient being monitored and Timeout performed Patient Re-evaluated:Patient Re-evaluated prior to induction Oxygen Delivery Method: Circle system utilized Preoxygenation: Pre-oxygenation with 100% oxygen Induction Type: IV induction, Rapid sequence and Cricoid Pressure applied Laryngoscope Size: Mac and 3 Grade View: Grade II Tube type: Oral Tube size: 7.5 mm Number of attempts: 1 Airway Equipment and Method: Stylet Placement Confirmation: ETT inserted through vocal cords under direct vision,  positive ETCO2 and breath sounds checked- equal and bilateral Secured at: 22 cm Tube secured with: Tape Dental Injury: Teeth and Oropharynx as per pre-operative assessment

## 2019-03-05 NOTE — H&P (Addendum)
Dana [name] is an 73 y.o. female.   Chief Complaint: abd pain HPI: 69 yof with history of cervical dystonia and "colitis" for several years that she has lost fair amount of weight. She has alternating diarrhea and constipation.  She has csc in 2018 with significant looping with no mucosal abnormality and biopsies that are normal.  She got up from sitting today and had immediate sharp right sided pain. This has persisted. This is not getting better, worsening, feels bloated.  No bm/flatus.  Having emesis.  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    Past Medical History:  Diagnosis Date  . Anxiety   . Cervical dystonia    TREATED BY NEUROLOGIST AT [facility]--  BOTOX INJECTIONS  . Closed fracture of left distal radius   . Colitis   . DDD (degenerative disc disease), cervical   . Depression   . History of pericarditis    1989  . IBS (irritable bowel syndrome)   . MVP (mitral valve prolapse)   . Osteoporosis   . Spondylosis of cervical spine   . Torticollis, spasmodic   . Urinary incontinence   . Wears glasses     Past Surgical History:  Procedure Laterality Date  . CARDIAC CATHETERIZATION  04-05-2000  dr al little   normal LVF/  ef 60%/  no coronary disaease/  MVP with mild MR  . CATARACT EXTRACTION W/ INTRAOCULAR LENS  IMPLANT, BILATERAL Bilateral   . CATARACT EXTRACTION W/ INTRAOCULAR LENS IMPLANT Bilateral   . CLOSED REDUCTION RADIAL SHAFT Left 11/27/2014   Procedure: CLOSED REDUCTION DISTAL RADIUS WITH PINS;  Surgeon: [name], MD;  Location: [facility];  Service: Orthopedics;  Laterality: Left;  . COLONOSCOPY W/ POLYPECTOMY  03-30-2001  . COLONOSCOPY WITH PROPOFOL N/A 01/06/2017   Procedure: COLONOSCOPY WITH PROPOFOL;  Surgeon: Jeani Hawking, MD;  Location: [facility];  Service: Endoscopy;  Laterality: N/A;  . DILATION AND CURETTAGE OF UTERUS  yrs ago  . ESOPHAGOGASTRODUODENOSCOPY (EGD) WITH PROPOFOL N/A 01/06/2017   Procedure:  ESOPHAGOGASTRODUODENOSCOPY (EGD) WITH PROPOFOL;  Surgeon: Jeani Hawking, MD;  Location: [facility];  Service: Endoscopy;  Laterality: N/A;  . LAPAROSCOPIC CHOLECYSTECTOMY  04-27-2000  . PREAURICULAR CYST EXCISION Left 04/20/2018   Procedure: EXCISION PREAURICULAR CYST PEDIATRIC;  Surgeon: Osborn Coho, MD;  Location: [facility] OR;  Service: ENT;  Laterality: Left;  Also [address]  . ROTATOR CUFF REPAIR Right 11/ 2000    History reviewed. No pertinent family history. Social History:  reports that she quit smoking about 30 years ago. Her smoking use included cigarettes. She has a 30.00 pack-year smoking history. She has never used smokeless tobacco. She reports that she does not drink alcohol or use drugs.  Allergies:  Allergies  Allergen Reactions  . Penicillins Rash    PATIENT HAS HAD A PCN REACTION WITH IMMEDIATE RASH, FACIAL/TONGUE/THROAT SWELLING, SOB, OR LIGHTHEADEDNESS WITH HYPOTENSION:  #  #  YES  #  #  Has patient had a PCN reaction causing severe rash involving mucus membranes or skin necrosis: NO Has patient had a PCN reaction that required hospitalization NO Has patient had a PCN reaction occurring within the last 10 years: NO  . Sinemet [Carbidopa-Levodopa] Other (See Comments)    Reports lips/fingertips swelling, red face.  . Hydrocodone Itching  . Lactose Intolerance (Gi) Diarrhea    ANY MILK PRODUCTS  . Reclast [Zoledronic Acid] Rash    Rash   . Sulfa Antibiotics Rash  meds reviewed  Results for orders placed or performed during the hospital encounter of 02/21/2019 (from the past 48 hour(s))  Lipase, blood     Status: None   Collection Time: 02/10/2019 11:30 PM  Result Value Ref Range   Lipase 32 11 - 51 U/L    Comment: Performed at [facility], [address], [place], [place] [address]  Comprehensive metabolic panel     Status: Abnormal   Collection Time: 02/27/2019 11:30 PM  Result Value Ref Range   Sodium 136 135 - 145 mmol/L   Potassium 3.4 (L) 3.5 - 5.1 mmol/L    Chloride 94 (L) 98 - 111 mmol/L   CO2 27 22 - 32 mmol/L   Glucose, Bld 114 (H) 70 - 99 mg/dL   BUN 9 8 - 23 mg/dL   Creatinine, Ser 9.72 0.44 - 1.00 mg/dL   Calcium 9.1 8.9 - 82.0 mg/dL   Total Protein 7.3 6.5 - 8.1 g/dL   Albumin 4.6 3.5 - 5.0 g/dL   AST 34 15 - 41 U/L   ALT 31 0 - 44 U/L   Alkaline Phosphatase 75 38 - 126 U/L   Total Bilirubin 0.7 0.3 - 1.2 mg/dL   GFR calc non Af Amer >60 >60 mL/min   GFR calc Af Amer >60 >60 mL/min   Anion gap 15 5 - 15    Comment: Performed at [facility], [address], [place], [place] [address]  CBC     Status: Abnormal   Collection Time: 02/22/2019 11:30 PM  Result Value Ref Range   WBC 18.0 (H) 4.0 - 10.5 K/uL   RBC 4.40 3.87 - 5.11 MIL/uL   Hemoglobin 14.9 12.0 - 15.0 g/dL   HCT 15.3 79.4 - 32.7 %   MCV 100.7 (H) 80.0 - 100.0 fL   MCH 33.9 26.0 - 34.0 pg   MCHC 33.6 30.0 - 36.0 g/dL   RDW 61.4 70.9 - 29.5 %   Platelets 201 150 - 400 K/uL   nRBC 0.0 0.0 - 0.2 %    Comment: Performed at [facility], [address] [place], [place] [address]  Urinalysis, Routine w reflex microscopic     Status: Abnormal   Collection Time: 2019/03/22  1:29 AM  Result Value Ref Range   Color, Urine YELLOW YELLOW   APPearance CLOUDY (A) CLEAR   Specific Gravity, Urine 1.006 1.005 - 1.030   pH 9.0 (H) 5.0 - 8.0   Glucose, UA NEGATIVE NEGATIVE mg/dL   Hgb urine dipstick SMALL (A) NEGATIVE   Bilirubin Urine NEGATIVE NEGATIVE   Ketones, ur 5 (A) NEGATIVE mg/dL   Protein, ur NEGATIVE NEGATIVE mg/dL   Nitrite NEGATIVE NEGATIVE   Leukocytes,Ua NEGATIVE NEGATIVE   RBC / HPF 6-10 0 - 5 RBC/hpf   WBC, UA 0-5 0 - 5 WBC/hpf   Bacteria, UA RARE (A) NONE SEEN   Squamous Epithelial / LPF 0-5 0 - 5    Comment: Performed at [facility], [address], [place], [place] [address]   Ct Abdomen Pelvis W Contrast  Addendum Date: Mar 22, 2019   ADDENDUM REPORT: 2019-03-22 03:29 ADDENDUM: Study discussed by telephone with Dr. Ross Marcus on  Mar 22, 2019 at 0322 hours. Electronically Signed   By: Odessa Fleming M.D.   On: 03-22-19 03:29   Result Date: 2019/03/22 CLINICAL DATA:  73 year old female with acute abdominal pain, nausea and vomiting. EXAM: CT ABDOMEN AND PELVIS WITH CONTRAST TECHNIQUE: Multidetector CT imaging  of the abdomen and pelvis was performed using the standard protocol following bolus administration of intravenous contrast. CONTRAST:  100mL OMNIPAQUE IOHEXOL 300 MG/ML  SOLN COMPARISON:  CT Abdomen and Pelvis 08/01/2018 and earlier. FINDINGS: Lower chest: Centrilobular emphysema. Ectatic descending thoracic aorta. Otherwise negative. Hepatobiliary: Surgically absent gallbladder with increased intra and extrahepatic biliary ductal dilatation since 2019. Small volume perihepatic free fluid. No discrete liver lesion. Pancreas: Negative, main pancreatic duct size remains normal. Spleen: Negative. Adrenals/Urinary Tract: Symmetric appearing renal enhancement and contrast excretion. Negative adrenal glands. Unremarkable urinary bladder. Stomach/Bowel: Mild retained stool in the rectum. It appears the descending and sigmoid colon is completely decompressed. There is a moderately to severely dilated segment of large bowel in the central lower abdomen and pelvis (coronal image 39) with what appears to be an ileocecal valve (coronal image 42) and downstream volvulus at the junction with the ascending colon on coronal image 45. The ascending colon is then completely decompressed and mildly hyperenhancing. Mild-to-moderate contrast distension of the stomach. Contrast in jejunum and distal small bowel in the pelvis. Coronal image 43 depicts oral contrast just entering the abnormal cecum from the IC valve. Mildly dilated right abdominal small bowel containing gas and contrast. No free air. Vascular/Lymphatic: Aortoiliac calcified atherosclerosis. Major arterial structures are patent. Portal venous system is patent. Reproductive: Negative. Other: Small  volume of simple appearing free fluid in the abdomen and pelvis. Musculoskeletal: No acute osseous abnormality identified. IMPRESSION: 1. 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 The ascending and downstream colon is completely decompressed. 2. Small volume of free fluid in the abdomen and pelvis. No free air. 3. New intra- and extrahepatic biliary ductal dilatation in the setting of chronic cholecystectomy. This is of uncertain etiology but might be secondary to #1. Electronically Signed: By: Odessa FlemingH  Hall M.D. On: 02/28/2019 03:19   Dg Abdomen Acute W/chest  Result Date: 02/14/2019 CLINICAL DATA:  Initial evaluation for acute abdominal pain. EXAM: DG ABDOMEN ACUTE W/ 1V CHEST COMPARISON:  Prior CT from 08/01/2018. FINDINGS: Cardiac and mediastinal silhouettes within normal limits. Aortic atherosclerosis. Lungs normally inflated. No focal infiltrates. No edema or effusion. No pneumothorax. Prominent gaseous distension of what likely represents the sigmoid colon seen within the lower mid abdomen/pelvis. This is dilated up to approximately 8.6 cm in diameter. No small bowel dilatation. No free air on upright view. No soft tissue mass identified. Cholecystectomy clips noted. Few small calcific densities overlying the right upper quadrant noted, non-specific, but of doubtful clinical significance. No other abnormal calcification. No acute osseous finding. IMPRESSION: 1. Prominent gaseous distension of what likely represents the sigmoid colon seen within the lower mid abdomen/pelvis. Finding could be related to a prominent gas-filled redundant sigmoid colon or possibly volvulus. No evidence for small bowel obstruction. 2. No active cardiopulmonary disease. 3. Aortic atherosclerosis. Electronically Signed   By: Rise MuBenjamin  McClintock M.D.   On: 02/24/2019 00:50    Review of Systems  Gastrointestinal: Positive for abdominal pain, constipation, nausea and vomiting.  All other systems  reviewed and are negative.   Blood pressure (!) 165/95, pulse 71, temperature 98.5 F (36.9 C), temperature source Oral, resp. rate 18, height 5\' 4"  (1.626 m), weight 42.2 kg, SpO2 97 %. Physical Exam  Constitutional: She is oriented to person, place, and time. She appears well-developed and well-nourished.  HENT:  Head: Normocephalic and atraumatic.  Right Ear: External ear normal.  Mouth/Throat: Oropharynx is clear and moist.  Eyes: No scleral icterus.  Neck: Neck supple.  Cardiovascular: Normal rate, regular rhythm and normal heart sounds.  Respiratory: Effort normal and breath sounds normal.  GI: She exhibits distension. Bowel sounds are decreased. There is generalized abdominal tenderness. There is rebound. No hernia.  Musculoskeletal: Normal range of motion.        General: No tenderness.  Neurological: She is alert and oriented to person, place, and time.  Skin: Skin is warm and dry.  Psychiatric: She has a normal mood and affect. Her behavior is normal.     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  Risks include but not limited to bleeding, infection,reoperation, leakage of anastomosis, pulm and cardiac risks.  Delaying puts her at higher risk due to concern for ischemia and perforation. We also discussed hospital stay and recovery which I think will be lengthy as she has been chronically ill for some time.   -Husband Thersia Petraglia [phone]  Emelia Loron, MD 02/16/2019, 4:25 AM

## 2019-03-05 NOTE — Transfer of Care (Signed)
Immediate Anesthesia Transfer of Care Note  Patient: Dana [name]  Procedure(s) Performed: EXPLORATORY LAPAROTOMY,  RIGHT COLECTOMY (N/A Abdomen)  Patient Location: PACU  Anesthesia Type:General  Level of Consciousness: sedated, drowsy, patient cooperative and responds to stimulation  Airway & Oxygen Therapy: Patient Spontanous Breathing and Patient connected to face mask oxygen  Post-op Assessment: Report given to RN, Post -op Vital signs reviewed and stable and Patient moving all extremities X 4  Post vital signs: Reviewed and stable  Last Vitals:  Vitals Value Taken Time  BP 145/107 Mar 19, 2019  6:48 AM  Temp    Pulse 74 19-Mar-2019  6:49 AM  Resp 8 03/19/19  6:49 AM  SpO2 100 % Mar 19, 2019  6:49 AM  Vitals shown include unvalidated device data.  Last Pain:  Vitals:   2019-03-19 0310  TempSrc:   PainSc: 4          Complications: No apparent anesthesia complications

## 2019-03-05 NOTE — Op Note (Signed)
Preoperative diagnosis: Cecal volvulus Postoperative diagnosis: Same as above Procedure: Open right hemicolectomy Surgeon Dr Harden Mo EBL: 50 cc Drains none Specimens right colon to pathology Anesthesia general Complications none Sponge and needle count correct times two dispo to recovery stable  Indications: This is a 61 yof with long history of "colitis" who presents with acute onset lower abdominal pain and bloating.   She is tender with peritoneal signs and palpable volvulus on exam.  On CT scan she appears to have a cecal volvulus.  I discussed going to the operating room urgently for a right colectomy.I talked to her husband by phone prior to beginning.    Procedure: After informed consent was obtained the patient was given antibiotics.  She had SCDs in place.  She was placed under general anesthesia without complication.  A Foley catheter was placed. She did not receive entereg as she already had pain medication.   She was prepped and draped in the standard sterile surgical fashion.  Surgical timeout was then performed.  I made a midline incision and entered in the peritoneal cavity.  There was a fair amount of reactive fluid.  Her small bowel was not dilated.  Her cecum was very dilated.  It was dusky but had not perforated.  In the process of eviscerating this out of the abdomen I detorsed the volvulus.  I then was able to take down the white line of Toldt with cautery and rotate this medially.  The duodenum was observed and not injured.  I then was able to divide the terminal ileum with a GIA stapler.  I divided the transverse colon at a healthy portion. This was very tortuous and I removed it to the midportion due to [facility].  I then used a combination of the LigaSure device and 2-0 silk sutures to divide the mesentery which was very elongated.  I then passed this off the table as a specimen.  I then ran the entire small bowel with no evidence of any injury and no abnormality.  The  remainder of the colon had stool present but no other abnormality.  I then brought the small bowel in proximity of the transverse colon.  I placed 3-0 silk sutures to oppose them.  I then I made enterotomies in both.  I inserted the GIA stapler and created the anastomosis.  This was patent and hemostatic.  I then closed the common enterotomy with a TX stapler.  I placed two 3-0 silk apex sutures.  I then irrigated copiously.  I then placed the omentum overlying the anastomosis and returned the bowel to the peritoneal cavity.  We then used the colon protocol and changed all of the instruments as well as are gowns and gloves.  I then used the #1 coated PDS to close the abdomen.  The wound was then irrigated again.  I placed a wet to dry dressing.   She tolerated this well was extubated and transferred to recovery stable.

## 2019-03-05 NOTE — Progress Notes (Signed)
Patient laying in bed comfortably.  She is still quite drowsy.  States pain is relatively well controlled.  Abdominal dressing in place with no bleeding.  Cont NGT today, but if continued low output then hopefully this can be removed tomorrow.  Try to mobilize later today if she starts waking up a little more.  Tried to call Greggory Stallion, her husband, but he did not answer his home phone or his cell phone.  Patient otherwise stable on POD 0.  Letha Cape 12:02 PM 02/15/2019

## 2019-03-06 ENCOUNTER — Encounter (HOSPITAL_COMMUNITY): Payer: Self-pay | Admitting: General Surgery

## 2019-03-06 LAB — CBC
HCT: 26 % — ABNORMAL LOW (ref 36.0–46.0)
Hemoglobin: 8.9 g/dL — ABNORMAL LOW (ref 12.0–15.0)
MCH: 34 pg (ref 26.0–34.0)
MCHC: 34.2 g/dL (ref 30.0–36.0)
MCV: 99.2 fL (ref 80.0–100.0)
Platelets: 245 10*3/uL (ref 150–400)
RBC: 2.62 MIL/uL — ABNORMAL LOW (ref 3.87–5.11)
RDW: 12.9 % (ref 11.5–15.5)
WBC: 15.9 10*3/uL — ABNORMAL HIGH (ref 4.0–10.5)
nRBC: 0 % (ref 0.0–0.2)

## 2019-03-06 LAB — BASIC METABOLIC PANEL
Anion gap: 9 (ref 5–15)
BUN: 17 mg/dL (ref 8–23)
CO2: 27 mmol/L (ref 22–32)
Calcium: 8 mg/dL — ABNORMAL LOW (ref 8.9–10.3)
Chloride: 100 mmol/L (ref 98–111)
Creatinine, Ser: 0.68 mg/dL (ref 0.44–1.00)
GFR calc Af Amer: >60 mL/min (ref >60)
GFR calc non Af Amer: >60 mL/min (ref >60)
Glucose, Bld: 110 mg/dL — ABNORMAL HIGH (ref 70–99)
Potassium: 3.5 mmol/L (ref 3.5–5.1)
Sodium: 136 mmol/L (ref 135–145)

## 2019-03-06 LAB — GLUCOSE, CAPILLARY: Glucose-Capillary: 105 mg/dL — ABNORMAL HIGH (ref 70–99)

## 2019-03-06 MED ORDER — ACETAMINOPHEN 500 MG PO TABS
1000.0000 mg | ORAL_TABLET | Freq: Four times a day (QID) | ORAL | Status: DC
  Administered 2019-03-06 – 2019-03-17 (×36): 1000 mg via ORAL
  Filled 2019-03-06 (×41): qty 2

## 2019-03-06 MED ORDER — METHOCARBAMOL 500 MG PO TABS
500.0000 mg | ORAL_TABLET | Freq: Three times a day (TID) | ORAL | Status: DC
  Administered 2019-03-06 – 2019-03-07 (×6): 500 mg via ORAL
  Filled 2019-03-06 (×6): qty 1

## 2019-03-06 MED ORDER — OXYCODONE HCL 5 MG PO TABS
5.0000 mg | ORAL_TABLET | ORAL | Status: DC | PRN
  Administered 2019-03-07: 10 mg via ORAL
  Filled 2019-03-06: qty 2

## 2019-03-06 MED ORDER — ENOXAPARIN SODIUM 30 MG/0.3ML ~~LOC~~ SOLN
30.0000 mg | SUBCUTANEOUS | Status: DC
  Filled 2019-03-06: qty 0.3

## 2019-03-06 NOTE — Progress Notes (Signed)
PT Cancellation Note  Patient Details Name: Dana [name] MRN: [id number] DOB: [birth year]   Cancelled Treatment:    Reason Eval/Treat Not Completed: Patient declined, no reason specified Attempted to evaluate patient at 1600. Patient states she did not know we were coming and feels caught off guard.  Educated on benefits of mobility during hospitalization and role of PT. Pt requests that PT come tomorrow as its too late today now (1600)... "3 pm so I can get up and start my day". Discussed we may be asked to see her sooner but would try to accommodate.   Etta Grandchild, PT, DPT Acute Rehabilitation Services Pager: [phone] Office: [phone]     Etta Grandchild 03/06/2019, 3:58 PM

## 2019-03-06 NOTE — Plan of Care (Signed)
  Problem: Pain Managment: Goal: General experience of comfort will improve Outcome: Progressing   Problem: Safety: Goal: Ability to remain free from injury will improve Outcome: Progressing   

## 2019-03-06 NOTE — Progress Notes (Addendum)
Returned phone call to husband to update on pt status. Dietary called to place pt on automatic tray.

## 2019-03-06 NOTE — Progress Notes (Signed)
Spoke to Husband, Makensey Crees, and updated him on patient's status.  Letha Cape 11:27 AM 03/06/2019

## 2019-03-06 NOTE — Progress Notes (Signed)
1 Day Post-Op  Subjective: Patient feels weak, but overall her pain is much better controlled and so she feels improved.  Hasn't mobilized yet.  No flatus.  Objective: Vital signs in last 24 hours: Temp:  [97.7 F (36.5 C)-99.3 F (37.4 C)] 98.4 F (36.9 C) (04/29 0743) Pulse Rate:  [69-92] 86 (04/29 0743) Resp:  [15-19] 18 (04/29 0743) BP: (138-174)/(66-98) 142/66 (04/29 0743) SpO2:  [93 %-100 %] 100 % (04/29 0743) Weight:  [44.6 kg] 44.6 kg (04/29 0317) Last BM Date: 03/03/2019  Intake/Output from previous day: 04/28 0701 - 04/29 0700 In: 1752.9 [I.V.:1502.9; IV Piggyback:150] Out: 755 [Urine:655; Emesis/NG output:100] Intake/Output this shift: No intake/output data recorded.  PE: Heart: regular Lungs: CTAB Abd: soft, appropriately tender, few BS, NGT with essentially no output.  Midline wound is clean and packed albeit very tiny.   GU: foley in place with clear pale yellow urine.  Lab Results:  Recent Labs    02/18/2019 2330  WBC 18.0*  HGB 14.9  HCT 44.3  PLT 201   BMET Recent Labs    02/21/2019 2330 03/06/19 0539  NA 136 136  K 3.4* 3.5  CL 94* 100  CO2 27 27  GLUCOSE 114* 110*  BUN 9 17  CREATININE 0.57 0.68  CALCIUM 9.1 8.0*   PT/INR No results for input(s): LABPROT, INR in the last 72 hours. CMP     Component Value Date/Time   NA 136 03/06/2019 0539   K 3.5 03/06/2019 0539   CL 100 03/06/2019 0539   CO2 27 03/06/2019 0539   GLUCOSE 110 (H) 03/06/2019 0539   BUN 17 03/06/2019 0539   CREATININE 0.68 03/06/2019 0539   CALCIUM 8.0 (L) 03/06/2019 0539   PROT 7.3 03/02/2019 2330   ALBUMIN 4.6 02/13/2019 2330   AST 34 02/19/2019 2330   ALT 31 02/13/2019 2330   ALKPHOS 75 02/23/2019 2330   BILITOT 0.7 02/27/2019 2330   GFRNONAA >60 03/06/2019 0539   GFRAA >60 03/06/2019 0539   Lipase     Component Value Date/Time   LIPASE 32 02/25/2019 2330       Studies/Results: Ct Abdomen Pelvis W Contrast  Addendum Date: 02/09/2019   ADDENDUM  REPORT: 03/07/2019 03:29 ADDENDUM: Study discussed by telephone with Dr. Ross MarcusOURTNEY HORTON on 02/25/2019 at 0322 hours. Electronically Signed   By: Odessa FlemingH  Hall M.D.   On: 02/26/2019 03:29   Result Date: 02/20/2019 CLINICAL DATA:  73 year old female with acute abdominal pain, nausea and vomiting. EXAM: CT ABDOMEN AND PELVIS WITH CONTRAST TECHNIQUE: Multidetector CT imaging of the abdomen and pelvis was performed using the standard protocol following bolus administration of intravenous contrast. CONTRAST:  100mL OMNIPAQUE IOHEXOL 300 MG/ML  SOLN COMPARISON:  CT Abdomen and Pelvis 08/01/2018 and earlier. FINDINGS: Lower chest: Centrilobular emphysema. Ectatic descending thoracic aorta. Otherwise negative. Hepatobiliary: Surgically absent gallbladder with increased intra and extrahepatic biliary ductal dilatation since 2019. Small volume perihepatic free fluid. No discrete liver lesion. Pancreas: Negative, main pancreatic duct size remains normal. Spleen: Negative. Adrenals/Urinary Tract: Symmetric appearing renal enhancement and contrast excretion. Negative adrenal glands. Unremarkable urinary bladder. Stomach/Bowel: Mild retained stool in the rectum. It appears the descending and sigmoid colon is completely decompressed. There is a moderately to severely dilated segment of large bowel in the central lower abdomen and pelvis (coronal image 39) with what appears to be an ileocecal valve (coronal image 42) and downstream volvulus at the junction with the ascending colon on coronal image 45. The ascending colon is  then completely decompressed and mildly hyperenhancing. Mild-to-moderate contrast distension of the stomach. Contrast in jejunum and distal small bowel in the pelvis. Coronal image 43 depicts oral contrast just entering the abnormal cecum from the IC valve. Mildly dilated right abdominal small bowel containing gas and contrast. No free air. Vascular/Lymphatic: Aortoiliac calcified atherosclerosis. Major arterial  structures are patent. Portal venous system is patent. Reproductive: Negative. Other: Small volume of simple appearing free fluid in the abdomen and pelvis. Musculoskeletal: No acute osseous abnormality identified. IMPRESSION: 1. 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 The ascending and downstream colon is completely decompressed. 2. Small volume of free fluid in the abdomen and pelvis. No free air. 3. New intra- and extrahepatic biliary ductal dilatation in the setting of chronic cholecystectomy. This is of uncertain etiology but might be secondary to #1. Electronically Signed: By: Odessa Fleming M.D. On: Apr 02, 2019 03:19   Dg Abdomen Acute W/chest  Result Date: 04-02-2019 CLINICAL DATA:  Initial evaluation for acute abdominal pain. EXAM: DG ABDOMEN ACUTE W/ 1V CHEST COMPARISON:  Prior CT from 08/01/2018. FINDINGS: Cardiac and mediastinal silhouettes within normal limits. Aortic atherosclerosis. Lungs normally inflated. No focal infiltrates. No edema or effusion. No pneumothorax. Prominent gaseous distension of what likely represents the sigmoid colon seen within the lower mid abdomen/pelvis. This is dilated up to approximately 8.6 cm in diameter. No small bowel dilatation. No free air on upright view. No soft tissue mass identified. Cholecystectomy clips noted. Few small calcific densities overlying the right upper quadrant noted, non-specific, but of doubtful clinical significance. No other abnormal calcification. No acute osseous finding. IMPRESSION: 1. Prominent gaseous distension of what likely represents the sigmoid colon seen within the lower mid abdomen/pelvis. Finding could be related to a prominent gas-filled redundant sigmoid colon or possibly volvulus. No evidence for small bowel obstruction. 2. No active cardiopulmonary disease. 3. Aortic atherosclerosis. Electronically Signed   By: Rise Mu M.D.   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 -path pending  FEN - IVFs/CLD VTE - Lovenox, SCDs ID - none Foley - DC 4/29   LOS: 1 day    Letha Cape , [facility] 03/06/2019, 9:29 AM Pager: [phone]

## 2019-03-07 ENCOUNTER — Inpatient Hospital Stay (HOSPITAL_COMMUNITY): Payer: Medicare Other

## 2019-03-07 LAB — CBC
HCT: 19.6 % — ABNORMAL LOW (ref 36.0–46.0)
HCT: 27.3 % — ABNORMAL LOW (ref 36.0–46.0)
Hemoglobin: 6.6 g/dL — CL (ref 12.0–15.0)
Hemoglobin: 9.3 g/dL — ABNORMAL LOW (ref 12.0–15.0)
MCH: 34.4 pg — ABNORMAL HIGH (ref 26.0–34.0)
MCH: 31.8 pg (ref 26.0–34.0)
MCHC: 33.7 g/dL (ref 30.0–36.0)
MCHC: 34.1 g/dL (ref 30.0–36.0)
MCV: 102.1 fL — ABNORMAL HIGH (ref 80.0–100.0)
MCV: 93.5 fL (ref 80.0–100.0)
Platelets: 179 10*3/uL (ref 150–400)
Platelets: 207 10*3/uL (ref 150–400)
RBC: 1.92 MIL/uL — ABNORMAL LOW (ref 3.87–5.11)
RBC: 2.92 MIL/uL — ABNORMAL LOW (ref 3.87–5.11)
RDW: 12.8 % (ref 11.5–15.5)
RDW: 17.2 % — ABNORMAL HIGH (ref 11.5–15.5)
WBC: 10.9 10*3/uL — ABNORMAL HIGH (ref 4.0–10.5)
WBC: 11.2 10*3/uL — ABNORMAL HIGH (ref 4.0–10.5)
nRBC: 0 % (ref 0.0–0.2)
nRBC: 0 % (ref 0.0–0.2)

## 2019-03-07 LAB — BASIC METABOLIC PANEL
Anion gap: 10 (ref 5–15)
BUN: 8 mg/dL (ref 8–23)
CO2: 25 mmol/L (ref 22–32)
Calcium: 8.1 mg/dL — ABNORMAL LOW (ref 8.9–10.3)
Chloride: 101 mmol/L (ref 98–111)
Creatinine, Ser: 0.49 mg/dL (ref 0.44–1.00)
GFR calc Af Amer: >60 mL/min (ref >60)
GFR calc non Af Amer: >60 mL/min (ref >60)
Glucose, Bld: 74 mg/dL (ref 70–99)
Potassium: 3.2 mmol/L — ABNORMAL LOW (ref 3.5–5.1)
Sodium: 136 mmol/L (ref 135–145)

## 2019-03-07 LAB — PREPARE RBC (CROSSMATCH)

## 2019-03-07 LAB — HEMOGLOBIN AND HEMATOCRIT, BLOOD
HCT: 20.9 % — ABNORMAL LOW (ref 36.0–46.0)
Hemoglobin: 6.9 g/dL — CL (ref 12.0–15.0)

## 2019-03-07 MED ORDER — POTASSIUM CHLORIDE 10 MEQ/100ML IV SOLN
10.0000 meq | INTRAVENOUS | Status: AC
  Administered 2019-03-07 (×4): 10 meq via INTRAVENOUS
  Filled 2019-03-07 (×4): qty 100

## 2019-03-07 MED ORDER — IOHEXOL 300 MG/ML  SOLN
90.0000 mL | Freq: Once | INTRAMUSCULAR | Status: AC | PRN
  Administered 2019-03-07: 90 mL via INTRAVENOUS

## 2019-03-07 MED ORDER — SODIUM CHLORIDE 0.9% IV SOLUTION
Freq: Once | INTRAVENOUS | Status: AC
  Administered 2019-03-07: 11:00:00 via INTRAVENOUS

## 2019-03-07 MED ORDER — SUCRALFATE 1 GM/10ML PO SUSP
1.0000 g | Freq: Two times a day (BID) | ORAL | Status: DC
  Administered 2019-03-07 – 2019-03-17 (×20): 1 g via ORAL
  Filled 2019-03-07 (×20): qty 10

## 2019-03-07 NOTE — Progress Notes (Addendum)
Daily Patient Note  Received report from [facility]. Introduced self to patient who vocalized overall feeling weak. Patient AM Hgb noted to be 6.6 upon redraw noted to be 6.9 Written for PRBCs x2 units though d/t blood bank being low they requested we give one unit and verify if another is needed post CBC (if Hgb < 7). CT Abd/Pelvis ordered to r/o retroperitoneal bleed (+) hemopertioneum. Patient gotten OOB to chair for which she tolerated x60 minutes. Poor appetite overall diet transitioned to blendarized. Good urine output via purewick and BSC voids. Patient with 8/10 pain in afternoon given morphine with improvement. Able to get into chair for 45 minutes in evening and mobilize to sink to perform BADLs (teeth brushing, face washing, hair brushing, makeup application). Appears overall much better in the evening post 1unit PRBC transfusion. Abd dressing changed (+) dehiscence with muscle exposed, though base appears clean.Educated on IS use and frequency.  All care needs met during the course of the shift.   Labs:  03/07/2019 04:31 03/07/2019 08:54 03/07/2019 18:09  Hemoglobin 6.6 (LL) 6.9 (LL) 9.3 (L)  HCT 19.6 (L) 20.9 (L) 27.3 (L)   Imaging: CT Abd/Pelvis (03/07/2019): 1.  Status post interval right colectomy. 2. Moderate volume four quadrant ascites with intermediate attenuation, HU = 42, increased compared to prior examination and concerning for hemoperitoneum given drop in hematocrit. There is no layering attenuation or other direct findings to suggest nidus of hemorrhage. 3.  Postoperative pneumoperitoneum. 4.  Other chronic and incidental findings as detailed above.  DC Plan: Pending PT evaluation. Will discharge when medically optimized.

## 2019-03-07 NOTE — Progress Notes (Signed)
PT Cancellation Note  Patient Details Name: Dana [name] MRN: [id number] DOB: [birth year]   Cancelled Treatment:    Reason Eval/Treat Not Completed: Patient declined, no reason specified. Pt refusing to participate in PT evaluation x2 today, despite setting up a specific "appointment time" with PT at 3:00pm. Pt stating that her pain medicine was not helping and that she was in too much pain to move. PT provided max encouragement and support for OOB mobility and reinforced benefits of mobilization post-surgery. PT will continue to follow acutely and attempt evaluation as available.    Alessandra Bevels [name] 03/07/2019, 3:06 PM

## 2019-03-07 NOTE — Anesthesia Postprocedure Evaluation (Signed)
Anesthesia Post Note  Patient: Dana [name]  Procedure(s) Performed: EXPLORATORY LAPAROTOMY,  RIGHT COLECTOMY (N/A Abdomen)     Patient location during evaluation: PACU Anesthesia Type: General Level of consciousness: sedated and patient cooperative Pain management: pain level controlled Vital Signs Assessment: post-procedure vital signs reviewed and stable Respiratory status: spontaneous breathing Cardiovascular status: stable Anesthetic complications: no    Last Vitals:  Vitals:   03/07/19 1519 03/07/19 1608  BP: 129/64 126/66  Pulse: 87 74  Resp: 16 16  Temp: 36.8 C 36.7 C  SpO2: 96% 97%    Last Pain:  Vitals:   03/07/19 1608  TempSrc: Oral  PainSc:                  [name]

## 2019-03-07 NOTE — Plan of Care (Signed)
  Problem: Clinical Measurements: Goal: Will remain free from infection Outcome: Progressing   Problem: Safety: Goal: Ability to remain free from injury will improve Outcome: Progressing   

## 2019-03-07 NOTE — Progress Notes (Addendum)
Patient ID: Dana [name], female   DOB: [birth year], 73 y.o.   MRN: [id number]    2 Days Post-Op  Subjective: Patient sleepy this morning.  Not mobilizing much at all.  Got up at 0300am this morning to void.  Doesn't really want to walk in the halls due to COVID.  A little nauseated.  Passed a small amount of flatus.  States pain has been her biggest issue still.  Objective: Vital signs in last 24 hours: Temp:  [97.5 F (36.4 C)-98.7 F (37.1 C)] 98.7 F (37.1 C) (04/30 0721) Pulse Rate:  [82-85] 82 (04/30 0721) Resp:  [12-18] 16 (04/30 0721) BP: (117-145)/(60-91) 117/60 (04/30 0721) SpO2:  [100 %] 100 % (04/30 0721) Weight:  [41.6 kg] 41.6 kg (04/30 0500) Last BM Date: 02/15/2019  Intake/Output from previous day: 04/29 0701 - 04/30 0700 In: 1218.6 [P.O.:120; I.V.:1098.6] Out: 450 [Urine:450] Intake/Output this shift: No intake/output data recorded.  PE: Heart: regular, soft murmur Lungs: CTAB Abd: soft, appropriately tender, slightly poochy particularly in her lower abdomen, +BS, midline wound is clean  Lab Results:  Recent Labs    03/06/19 0539 03/07/19 0431  WBC 15.9* 10.9*  HGB 8.9* 6.6*  HCT 26.0* 19.6*  PLT 245 179   BMET Recent Labs    03/06/19 0539 03/07/19 0431  NA 136 136  K 3.5 3.2*  CL 100 101  CO2 27 25  GLUCOSE 110* 74  BUN 17 8  CREATININE 0.68 0.49  CALCIUM 8.0* 8.1*   PT/INR No results for input(s): LABPROT, INR in the last 72 hours. CMP     Component Value Date/Time   NA 136 03/07/2019 0431   K 3.2 (L) 03/07/2019 0431   CL 101 03/07/2019 0431   CO2 25 03/07/2019 0431   GLUCOSE 74 03/07/2019 0431   BUN 8 03/07/2019 0431   CREATININE 0.49 03/07/2019 0431   CALCIUM 8.1 (L) 03/07/2019 0431   PROT 7.3 02/17/2019 2330   ALBUMIN 4.6 02/07/2019 2330   AST 34 03/01/2019 2330   ALT 31 02/16/2019 2330   ALKPHOS 75 02/28/2019 2330   BILITOT 0.7 02/09/2019 2330   GFRNONAA >60 03/07/2019 0431   GFRAA >60 03/07/2019 0431   Lipase      Component Value Date/Time   LIPASE 32 03/02/2019 2330       Studies/Results: No results found.  Anti-infectives: Anti-infectives (From admission, onward)   Start     Dose/Rate Route Frequency Ordered Stop   03-31-19 0515  clindamycin (CLEOCIN) IVPB 900 mg     900 mg 100 mL/hr over 30 Minutes Intravenous To Surgery Mar 31, 2019 0425 31-Mar-2019 0531   Mar 31, 2019 0515  gentamicin (GARAMYCIN) 210 mg in dextrose 5 % 100 mL IVPB     5 mg/kg  42.2 kg 105.3 mL/hr over 60 Minutes Intravenous STAT 31-Mar-2019 0425 03-31-19 0604       Assessment/Plan Cervical dystonia Anemia - hbg 6.6 today apparently with a drop from 8.9 yesterday and 14 pre-op.  Her HR is normal and she is normotensive.  No obvious signs of bleeding, but will get a STAT H&H to verify this result.  If this is the case, she will need to be transfused and a work up to determine possible source of bleeding such as CT scan of the A/P.   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  Patient has some nausea, but also has BS and is passing some flatus -  cont tylenol, robaxinl, cont IV pain meds prn, oral pain meds prn as well -needs to mobilize.  PT saw her yesterday but essentially cancelled session as patient really didn't want to participate.   -IS, pulm toilet -TX to 6N  -path is benign with no evidence of colitis or malignant process  FEN - IVFs/CLD, hypokalemia K 3.3, will give 40 meq today VTE - Lovenox (may need to hold if evidence of bleeding), SCDs ID - none Foley - DC 4/29  [place] [facility] [place] [facility] - Husband Lanney GinsGeorge Loor, will call after repeat labs obtained.    LOS: 2 days    Letha CapeKelly E Zaydin Billey , [facility] 03/07/2019, 8:30 AM Pager: [phone]

## 2019-03-07 NOTE — Progress Notes (Addendum)
ADDENDUM: CT does show moderate hemoperitoneum c/w drop in her hgb.  No active extravasation was noted on her CT Scan.  We will transfuse 1 unit of pRBC per [facility] protocol right now.  Will check a post transfusion H/H to determine if she needs another unit due to inappropriate response.  If not we will recheck hgb in am and follow her closely.  I have cancelled her transfer to the floor.  She will stay in progress unit and return to cardiac monitoring to follow heart rate.  We will continue with q 2 hrs vitals for now.  Her husband will be update to this information.  Letha Cape 12:15 PM 03/07/2019  Spoke to Husband, Greggory Stallion, and updated him on patient's status.  Letha Cape 2:09 PM

## 2019-03-08 LAB — BASIC METABOLIC PANEL
Anion gap: 11 (ref 5–15)
BUN: <5 mg/dL — ABNORMAL LOW (ref 8–23)
CO2: 28 mmol/L (ref 22–32)
Calcium: 8.6 mg/dL — ABNORMAL LOW (ref 8.9–10.3)
Chloride: 101 mmol/L (ref 98–111)
Creatinine, Ser: 0.44 mg/dL (ref 0.44–1.00)
GFR calc Af Amer: >60 mL/min (ref >60)
GFR calc non Af Amer: >60 mL/min (ref >60)
Glucose, Bld: 112 mg/dL — ABNORMAL HIGH (ref 70–99)
Potassium: 3.9 mmol/L (ref 3.5–5.1)
Sodium: 140 mmol/L (ref 135–145)

## 2019-03-08 LAB — TYPE AND SCREEN
ABO/RH(D): A POS
Antibody Screen: NEGATIVE
Unit division: 0

## 2019-03-08 LAB — CBC
HCT: 27.6 % — ABNORMAL LOW (ref 36.0–46.0)
Hemoglobin: 9.6 g/dL — ABNORMAL LOW (ref 12.0–15.0)
MCH: 32.3 pg (ref 26.0–34.0)
MCHC: 34.8 g/dL (ref 30.0–36.0)
MCV: 92.9 fL (ref 80.0–100.0)
Platelets: 228 10*3/uL (ref 150–400)
RBC: 2.97 MIL/uL — ABNORMAL LOW (ref 3.87–5.11)
RDW: 16.9 % — ABNORMAL HIGH (ref 11.5–15.5)
WBC: 10.4 10*3/uL (ref 4.0–10.5)
nRBC: 0 % (ref 0.0–0.2)

## 2019-03-08 LAB — BPAM RBC
Blood Product Expiration Date: 202005052359
ISSUE DATE / TIME: 202004301214
Unit Type and Rh: 6200

## 2019-03-08 MED ORDER — HALOPERIDOL LACTATE 5 MG/ML IJ SOLN
5.0000 mg | Freq: Once | INTRAMUSCULAR | Status: AC
  Administered 2019-03-08: 5 mg via INTRAVENOUS
  Filled 2019-03-08: qty 1

## 2019-03-08 MED ORDER — HALOPERIDOL LACTATE 5 MG/ML IJ SOLN: 5.0000 mg | Freq: Four times a day (QID) | INTRAMUSCULAR | Status: DC | PRN

## 2019-03-08 MED ORDER — TRAMADOL HCL 50 MG PO TABS
50.0000 mg | ORAL_TABLET | Freq: Four times a day (QID) | ORAL | Status: DC | PRN
  Administered 2019-03-08 (×2): 50 mg via ORAL
  Filled 2019-03-08 (×2): qty 1

## 2019-03-08 NOTE — Progress Notes (Signed)
Patient ID: Dana [name], female   DOB: 1946/06/11, 73 y.o.   MRN: [id number]    3 Days Post-Op  Subjective: Patient is very confused this morning.  She is oriented x3, but is very agitated and feels as if she is dying.  She has called her husband and demanded he come pick her up right now and take her home.  She thinks her room is very "weird" and this hospital "is unlike any I've ever seen and I want to go to a new one."  She states her husband has not been called and updated on any information.  I reassured her that I have called him each day to update him and he knows exactly what is going on with her.  She states that he is not calling her.  (I spoke to him already and he is calling her as well as their children)  She must not remember them calling.  She states at one point it is 8:00 at night and I informed her that it was the morning.  She is drinking apple juice even though she is NPO and mad that we aren't feeding her.  She can reiterate that she had surgery on her intestines and that is why she is in the hospital. She is insistent that I contact Dr. Elnoria Howard and let him know that she is here.  Objective: Vital signs in last 24 hours: Temp:  [97.4 F (36.3 C)-98.9 F (37.2 C)] 98.9 F (37.2 C) (05/01 0115) Pulse Rate:  [74-90] 90 (05/01 0730) Resp:  [16-18] 18 (05/01 0730) BP: (126-156)/(57-98) 132/61 (05/01 0730) SpO2:  [91 %-100 %] 91 % (05/01 0730) Last BM Date: 03/29/2019  Intake/Output from previous day: 04/30 0701 - 05/01 0700 In: 533.3 [P.O.:150; Blood:383.3] Out: 2975 [Urine:2975] Intake/Output this shift: No intake/output data recorded.  PE: Gen: agitated and upset, but NAD.  Very weak to get up to bedside commode Heart: regular Lungs: CTAB Abd: soft, didn't seem terribly tender, but patient not paying attention to me examining her abdomen, great BS, midline wound is clean  Lab Results:  Recent Labs    03/07/19 0431 03/07/19 0854 03/07/19 1809  WBC 10.9*  --  11.2*   HGB 6.6* 6.9* 9.3*  HCT 19.6* 20.9* 27.3*  PLT 179  --  207   BMET Recent Labs    03/06/19 0539 03/07/19 0431  NA 136 136  K 3.5 3.2*  CL 100 101  CO2 27 25  GLUCOSE 110* 74  BUN 17 8  CREATININE 0.68 0.49  CALCIUM 8.0* 8.1*   PT/INR No results for input(s): LABPROT, INR in the last 72 hours. CMP     Component Value Date/Time   NA 136 03/07/2019 0431   K 3.2 (L) 03/07/2019 0431   CL 101 03/07/2019 0431   CO2 25 03/07/2019 0431   GLUCOSE 74 03/07/2019 0431   BUN 8 03/07/2019 0431   CREATININE 0.49 03/07/2019 0431   CALCIUM 8.1 (L) 03/07/2019 0431   PROT 7.3 03/29/2019 2330   ALBUMIN 4.6 03/29/19 2330   AST 34 03/29/19 2330   ALT 31 2019-03-29 2330   ALKPHOS 75 03/29/19 2330   BILITOT 0.7 2019-03-29 2330   GFRNONAA >60 03/07/2019 0431   GFRAA >60 03/07/2019 0431   Lipase     Component Value Date/Time   LIPASE 32 Mar 29, 2019 2330       Studies/Results: Ct Abdomen Pelvis W Contrast  Result Date: 03/07/2019 CLINICAL DATA:  Status post right colectomy, dropping  hematocrit, rule out hematoma EXAM: CT ABDOMEN AND PELVIS WITH CONTRAST TECHNIQUE: Multidetector CT imaging of the abdomen and pelvis was performed using the standard protocol following bolus administration of intravenous contrast. CONTRAST:  90mL OMNIPAQUE IOHEXOL 300 MG/ML  SOLN COMPARISON:  02/18/2019 FINDINGS: Lower chest: New small bilateral pleural effusions and associated atelectasis. Hepatobiliary: No focal liver abnormality is seen. Status post cholecystectomy. Expected postoperative biliary ductal dilatation. Pancreas: Unremarkable. No pancreatic ductal dilatation or surrounding inflammatory changes. Spleen: Normal in size without focal abnormality. Adrenals/Urinary Tract: Adrenal glands are unremarkable. Kidneys are normal, without renal calculi, focal lesion, or hydronephrosis. Bladder is unremarkable. Stomach/Bowel: Interval postoperative findings of right colectomy. No evidence of bowel  obstruction. Vascular/Lymphatic: Mixed calcific atherosclerosis. No enlarged abdominal or pelvic lymph nodes. Reproductive: No mass or other abnormality. Other: No abdominal wall hernia or abnormality. Moderate volume four quadrant ascites with intermediate attenuation, HU = 42, increased compared to prior examination. Postoperative pneumoperitoneum. Musculoskeletal: No acute or significant osseous findings. IMPRESSION: 1.  Status post interval right colectomy. 2. Moderate volume four quadrant ascites with intermediate attenuation, HU = 42, increased compared to prior examination and concerning for hemoperitoneum given drop in hematocrit. There is no layering attenuation or other direct findings to suggest nidus of hemorrhage. 3.  Postoperative pneumoperitoneum. 4.  Other chronic and incidental findings as detailed above. Electronically Signed   By: Lauralyn Primes M.D.   On: 03/07/2019 11:55    Anti-infectives: Anti-infectives (From admission, onward)   Start     Dose/Rate Route Frequency Ordered Stop   02/17/2019 0515  clindamycin (CLEOCIN) IVPB 900 mg     900 mg 100 mL/hr over 30 Minutes Intravenous To Surgery 02/19/2019 0425 02/22/2019 0531   02/18/2019 0515  gentamicin (GARAMYCIN) 210 mg in dextrose 5 % 100 mL IVPB     5 mg/kg  42.2 kg 105.3 mL/hr over 60 Minutes Intravenous STAT 02/24/2019 0425 02/27/2019 0604       Assessment/Plan Cervical dystonia ABL Anemia - hbg up to 9.3 today after 1 unit pRBCs yesterday secondary to hemoperitoneum.  Patient refusing lab to draw blood this am.  Currently seems stable and unlikely to require repeat trip to OR Delirium - haldol given this morning. Try best to keep patient calm and assuring her we are taking good care of her.  Minimize sedating/narcotic meds.  Orient her to time of day.  Keep blinds open during the day.   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  Her morphine is prn q2, but she only got a dose 1-2x yesterday.  Tramadol added today -needs to mobilize.  PT ordered but patient essentially refusing many things currently.  She is clearly weak when we tried to stand her to [facility] this morning.  -IS, pulm toilet -path is benign with no evidence of colitis or malignant process  FEN -IVFs/FLD VTE -Lovenox held due to bleeding, SCDs ID -none Foley - DC 4/29  Follow up - Dwain Sarna POC - Husband Dana [name], I have spoken to him in the room with the patient and called him on his cell phone out of the room to update him and discuss all of his concerns as clearly he is concerned about her delirium.  He is appreciative of her care.   LOS: 3 days    Letha Cape , PA-C  [facility] 03/08/2019, 8:42 AM Pager: [phone]

## 2019-03-08 NOTE — Progress Notes (Signed)
Contacted Dana [name] surgery patient is requesting more clonazepam at this time and does not want to wait until next dose at 10am, patient is relestless and asking to speak to husband and wants to leave and go home

## 2019-03-08 NOTE — Progress Notes (Signed)
Dr. Violeta Gelinas call back and gave a one time dose of haldol 5mg  IV push order for patient

## 2019-03-08 NOTE — Progress Notes (Signed)
Called and gave husband Greggory Stallion and Daughter Selena Batten an update on patient.

## 2019-03-08 NOTE — Progress Notes (Signed)
Patient refuse to allow phelbotomist to draw morning labs

## 2019-03-08 NOTE — TOC Initial Note (Signed)
Transition of Care [facility]) - Initial/Assessment Note    Patient Details  Name: Dana [name] MRN: [id number] Date of Birth: 07-Jan-1946  [facility] CM/SW Contact:    Leone Haven, RN Phone Number: 03/08/2019, 4:35 PM  Clinical Narrative:                 From home with spouse, patient is confused at this time, NCM tried to reach spouse at home phone and cell no answer, left message for him to call back .  NCM has not received call back from spouse, per pt eval rec HHPT and rolling walker.  This will need to be set up.  Expected Discharge Plan: Home w Home Health Services Barriers to Discharge: No Barriers Identified   Patient Goals and CMS Choice        Expected Discharge Plan and Services Expected Discharge Plan: Home w Home Health Services   Discharge Planning Services: CM Consult                     DME Arranged: N/A DME Agency: NA                  Prior Living Arrangements/Services   Lives with:: Spouse Patient language and need for interpreter reviewed:: Yes        Need for Family Participation in Patient Care: Yes (Comment) Care giver support system in place?: Yes (comment)   Criminal Activity/Legal Involvement Pertinent to Current Situation/Hospitalization: No - Comment as needed  Activities of Daily Living Home Assistive Devices/Equipment: None ADL Screening (condition at time of admission) Patient's cognitive ability adequate to safely complete daily activities?: Yes Is the patient deaf or have difficulty hearing?: No Does the patient have difficulty seeing, even when wearing glasses/contacts?: No Does the patient have difficulty concentrating, remembering, or making decisions?: No Patient able to express need for assistance with ADLs?: Yes Does the patient have difficulty dressing or bathing?: Yes Independently performs ADLs?: No Communication: Needs assistance Is this a change from baseline?: Pre-admission  baseline Dressing (OT): Needs assistance Is this a change from baseline?: Change from baseline, expected to last <3days Grooming: Needs assistance Is this a change from baseline?: Change from baseline, expected to last <3 days Bathing: Needs assistance Is this a change from baseline?: Change from baseline, expected to last <3 days In/Out Bed: Needs assistance Is this a change from baseline?: Change from baseline, expected to last <3 days Walks in Home: Needs assistance Is this a change from baseline?: Change from baseline, expected to last <3 days Does the patient have difficulty walking or climbing stairs?: Yes Weakness of Legs: Both Weakness of Arms/Hands: None  Permission Sought/Granted                  Emotional Assessment   Attitude/Demeanor/Rapport: Unable to Assess Affect (typically observed): Unable to Assess Orientation: : Fluctuating Orientation (Suspected and/or reported Sundowners)      Admission diagnosis:  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 Acute abdominal pain 24-Mar-2019  . 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 Small vessel disease, cerebrovascular 12/06/2018  . 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 Neck pain 10/02/2018  . Lesion of skin of left ear 04/20/2018   PCP:  Merri Brunette, MD Pharmacy:   [facility] [phone] - [place], [place] - [address] [place] [address] Phone: [phone]  Fax: [phone]     Social Determinants of Health (SDOH) Interventions    Readmission Risk Interventions No flowsheet data found.

## 2019-03-08 NOTE — Evaluation (Signed)
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 Pt with delirium post op. PMH - anxiety, depression, cervical dystonia, IBS, cervical spondylosis, osteoporosis, lt wrist fx, rt rotator cuff repair  Clinical Impression  Entered pt's room as pt's bed alarm going off as she had scooted down in bed. Explained I was from PT and was going to work with her. Pt initially stated she didn't want to be worked with. I explained I was going to help her get up and get more comfortable and she agreed. Pt generally weak but able to amb in hallway. Expect she will make good progress and be able to return home with husband. Attempted to call husband from room but no answer. Left message on answering machine on how pt had done with PT.     Follow Up Recommendations Home health PT;Supervision/Assistance - 24 hour    Equipment Recommendations  Rolling walker with 5" wheels    Recommendations for Other Services       Precautions / Restrictions Precautions Precautions: Fall Restrictions Weight Bearing Restrictions: No      Mobility  Bed Mobility Overal bed mobility: Needs Assistance Bed Mobility: Rolling;Sidelying to Sit Rolling: Min guard Sidelying to sit: Min assist       General bed mobility comments: Assist to elevate trunk into sitting.  Transfers Overall transfer level: Needs assistance Equipment used: 1 person hand held assist Transfers: Sit to/from [employer] Sit to Stand: Min assist Stand pivot transfers: Min assist       General transfer comment: Assist to bring hips up and for balance. Stand pivot bed to chair to bsc to chair.  Ambulation/Gait Ambulation/Gait assistance: Min assist Gait Distance (Feet): 175 Feet Assistive device: 1 person hand held assist Gait Pattern/deviations:  Step-through pattern;Decreased stride length;Narrow base of support;Trunk flexed Gait velocity: decr Gait velocity interpretation: <1.31 ft/sec, indicative of household ambulator General Gait Details: Assist for balance. Pt holding pillow across abdomen for pain control. Pt amb on RA with SpO2 92%. Left O2 off.  Stairs            Wheelchair Mobility    Modified Rankin (Stroke Patients Only)       Balance Overall balance assessment: Needs assistance Sitting-balance support: No upper extremity supported;Feet supported Sitting balance-Leahy Scale: Fair     Standing balance support: Single extremity supported Standing balance-Leahy Scale: Poor Standing balance comment: UE support for static standing                             Pertinent Vitals/Pain Pain Assessment: Faces Faces Pain Scale: Hurts little more Pain Location: abdomen Pain Descriptors / Indicators: Grimacing;Guarding Pain Intervention(s): Limited activity within patient's tolerance;Monitored during session;Premedicated before session;Repositioned    Home Living Family/patient expects to be discharged to:: Private residence Living Arrangements: Spouse/significant other Available Help at Discharge: Family;Available 24 hours/day Type of Home: House Home Access: Stairs to enter Entrance Stairs-Rails: Right Entrance Stairs-Number of Steps: 3 Home Layout: One level Home Equipment: None      Prior Function Level of Independence: Independent               Hand Dominance        Extremity/Trunk Assessment   Upper Extremity Assessment Upper Extremity Assessment: Generalized weakness    Lower Extremity Assessment Lower  Extremity Assessment: Generalized weakness       Communication      Cognition Arousal/Alertness: Awake/alert Behavior During Therapy: Anxious Overall Cognitive Status: Impaired/Different from baseline Area of Impairment: Orientation;Memory;Following  commands;Safety/judgement;Problem solving                 Orientation Level: Disoriented to;Time   Memory: Decreased short-term memory Following Commands: Follows one step commands consistently Safety/Judgement: Decreased awareness of safety;Decreased awareness of deficits   Problem Solving: Requires verbal cues        General Comments      Exercises     Assessment/Plan    PT Assessment Patient needs continued PT services  PT Problem List Decreased strength;Decreased activity tolerance;Decreased balance;Decreased mobility;Decreased knowledge of use of DME;Decreased cognition;Pain       PT Treatment Interventions DME instruction;Gait training;Stair training;Functional mobility training;Therapeutic activities;Therapeutic exercise;Balance training;Patient/family education;Cognitive remediation    PT Goals (Current goals can be found in the Care Plan section)  Acute Rehab PT Goals Patient Stated Goal: go home PT Goal Formulation: With patient Time For Goal Achievement: 03/22/19 Potential to Achieve Goals: Good    Frequency Min 3X/week   Barriers to discharge        Co-evaluation               AM-PAC PT "6 Clicks" Mobility  Outcome Measure Help needed turning from your back to your side while in a flat bed without using bedrails?: A Little Help needed moving from lying on your back to sitting on the side of a flat bed without using bedrails?: A Little Help needed moving to and from a bed to a chair (including a wheelchair)?: A Little Help needed standing up from a chair using your arms (e.g., wheelchair or bedside chair)?: A Little Help needed to walk in hospital room?: A Little Help needed climbing 3-5 steps with a railing? : A Little 6 Click Score: 18    End of Session   Activity Tolerance: Patient tolerated treatment well Patient left: in chair;with call bell/phone within reach;with chair alarm set Nurse Communication: Mobility status;Other  (comment)(O2 off and purewick removed) PT Visit Diagnosis: Unsteadiness on feet (R26.81);Muscle weakness (generalized) (M62.81)    Time: [id number] PT Time Calculation (min) (ACUTE ONLY): 30 min   Charges:   PT Evaluation $PT Eval Moderate Complexity: 1 Mod PT Treatments $Gait Training: 8-22 mins        [facility] Shaelee Forni PT Acute Rehabilitation Services Pager [phone] Office [phone]   Angelina [facility]Cary [facility] 03/08/2019, 12:30 PM

## 2019-03-08 DEATH — deceased

## 2019-03-09 LAB — BASIC METABOLIC PANEL
Anion gap: 9 (ref 5–15)
BUN: <5 mg/dL — ABNORMAL LOW (ref 8–23)
CO2: 29 mmol/L (ref 22–32)
Calcium: 8.9 mg/dL (ref 8.9–10.3)
Chloride: 97 mmol/L — ABNORMAL LOW (ref 98–111)
Creatinine, Ser: 0.64 mg/dL (ref 0.44–1.00)
GFR calc Af Amer: >60 mL/min (ref >60)
GFR calc non Af Amer: >60 mL/min (ref >60)
Glucose, Bld: 161 mg/dL — ABNORMAL HIGH (ref 70–99)
Potassium: 2.8 mmol/L — ABNORMAL LOW (ref 3.5–5.1)
Sodium: 135 mmol/L (ref 135–145)

## 2019-03-09 LAB — CBC
HCT: 31 % — ABNORMAL LOW (ref 36.0–46.0)
Hemoglobin: 10.8 g/dL — ABNORMAL LOW (ref 12.0–15.0)
MCH: 32.1 pg (ref 26.0–34.0)
MCHC: 34.8 g/dL (ref 30.0–36.0)
MCV: 92.3 fL (ref 80.0–100.0)
Platelets: 298 10*3/uL (ref 150–400)
RBC: 3.36 MIL/uL — ABNORMAL LOW (ref 3.87–5.11)
RDW: 16 % — ABNORMAL HIGH (ref 11.5–15.5)
WBC: 11.5 10*3/uL — ABNORMAL HIGH (ref 4.0–10.5)
nRBC: 0 % (ref 0.0–0.2)

## 2019-03-09 LAB — MAGNESIUM: Magnesium: 1.5 mg/dL — ABNORMAL LOW (ref 1.7–2.4)

## 2019-03-09 NOTE — Progress Notes (Signed)
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 Pt with delirium post op. PMH - anxiety, depression, cervical dystonia, IBS, cervical spondylosis, osteoporosis, lt wrist fx, rt rotator cuff repair    PT Comments    Assisted patient back to bed, patient confused out of bed looking for her contact on floor. Min to mod A with hand held assist. Side stepping along bed. Did not remember working with me earlier today.     Follow Up Recommendations  Home health PT;Supervision/Assistance - 24 hour     Equipment Recommendations  Rolling walker with 5" wheels    Recommendations for Other Services       Precautions / Restrictions Precautions Precautions: Fall Restrictions Weight Bearing Restrictions: No    Mobility  Bed Mobility Overal bed mobility: Needs Assistance Bed Mobility: Rolling;Sidelying to Sit Rolling: Min guard Sidelying to sit: Min assist       General bed mobility comments: Assist to elevate trunk into sitting.  Transfers Overall transfer level: Needs assistance Equipment used: 1 person hand held assist Transfers: Sit to/from [employer] Sit to Stand: Min assist Stand pivot transfers: Min assist       General transfer comment: Assist to bring hips up and for balance. Stand pivot bed to [facility]  Ambulation/Gait                 Stairs             Wheelchair Mobility    Modified Rankin (Stroke Patients Only)       Balance Overall balance assessment: Needs assistance Sitting-balance support: No upper extremity supported;Feet supported Sitting balance-Leahy Scale: Fair     Standing balance support: Single extremity supported Standing balance-Leahy Scale: Poor Standing balance comment: UE support for static standing                             Cognition Arousal/Alertness: Awake/alert Behavior During Therapy: Anxious Overall Cognitive Status: Impaired/Different from baseline Area of Impairment: Orientation;Memory;Following commands;Safety/judgement;Problem solving                 Orientation Level: Disoriented to;Time   Memory: Decreased short-term memory Following Commands: Follows one step commands consistently Safety/Judgement: Decreased awareness of safety;Decreased awareness of deficits   Problem Solving: Requires verbal cues        Exercises      General Comments        Pertinent Vitals/Pain Pain Assessment: Faces Faces Pain Scale: Hurts little more Pain Location: abdomen Pain Descriptors / Indicators: Grimacing;Guarding    Home Living                      Prior Function            PT Goals (current goals can now be found in the care plan section) Acute Rehab PT Goals Patient Stated Goal: go home PT Goal Formulation: With patient Time For Goal Achievement: 03/22/19 Potential to Achieve Goals: Good    Frequency    Min 3X/week      PT Plan      Co-evaluation              AM-PAC PT "6 Clicks" Mobility   Outcome Measure  Help needed turning from your back to your side while in a  flat bed without using bedrails?: A Little Help needed moving from lying on your back to sitting on the side of a flat bed without using bedrails?: A Little Help needed moving to and from a bed to a chair (including a wheelchair)?: A Little Help needed standing up from a chair using your arms (e.g., wheelchair or bedside chair)?: A Little Help needed to walk in hospital room?: A Little Help needed climbing 3-5 steps with a railing? : A Little 6 Click Score: 18    End of Session   Activity Tolerance: Patient tolerated treatment well Patient left: in chair;with call bell/phone within reach;with chair alarm set Nurse Communication: Mobility status;Other (comment)(O2 off and purewick  removed) PT Visit Diagnosis: Unsteadiness on feet (R26.81);Muscle weakness (generalized) (M62.81)     Time: [id number] PT Time Calculation (min) (ACUTE ONLY): 15 min  Charges:  $Therapeutic Activity: 8-22 mins                     Etta GrandchildSean Goebel Hellums, PT, DPT Acute Rehabilitation Services Pager: [phone] Office: [phone]    Etta GrandchildSean Daeja Helderman 03/09/2019, 12:23 PM

## 2019-03-09 NOTE — Progress Notes (Signed)
4 Days Post-Op  Subjective: CC: Nausea Patient reports that she is nauseated after eating breakfast this AM. No emesis. Having some lower abdominal cramping but feels like this may be because she has to have a BM. 1 BM yesterday. Passing flatus.   Objective: Vital signs in last 24 hours: Temp:  [98.6 F (37 C)-99.3 F (37.4 C)] 99.3 F (37.4 C) (05/02 0747) Pulse Rate:  [77-90] 90 (05/02 0747) Resp:  [16-20] 16 (05/02 0747) BP: (165-185)/(88-95) 165/95 (05/02 0747) SpO2:  [98 %-99 %] 98 % (05/02 0747) Weight:  [39 kg] 39 kg (05/02 0500) Last BM Date: 02/15/2019  Intake/Output from previous day: 05/01 0701 - 05/02 0700 In: 720 [P.O.:720] Out: 2350 [Urine:2350] Intake/Output this shift: No intake/output data recorded.  PE: Gen: Awake and alert, NAD. Up to bedside commode w/ PT Heart: regular Lungs: CTAB, normal effort  Abd: Soft, mild tenderness around midline. No peritonitis. +BS, midline wound dressed this AM by nursing staff.   Lab Results:  Recent Labs    03/07/19 1809 03/08/19 1628  WBC 11.2* 10.4  HGB 9.3* 9.6*  HCT 27.3* 27.6*  PLT 207 228   BMET Recent Labs    03/07/19 0431 03/08/19 1628  NA 136 140  K 3.2* 3.9  CL 101 101  CO2 25 28  GLUCOSE 74 112*  BUN 8 <5*  CREATININE 0.49 0.44  CALCIUM 8.1* 8.6*   PT/INR No results for input(s): LABPROT, INR in the last 72 hours. CMP     Component Value Date/Time   NA 140 03/08/2019 1628   K 3.9 03/08/2019 1628   CL 101 03/08/2019 1628   CO2 28 03/08/2019 1628   GLUCOSE 112 (H) 03/08/2019 1628   BUN <5 (L) 03/08/2019 1628   CREATININE 0.44 03/08/2019 1628   CALCIUM 8.6 (L) 03/08/2019 1628   PROT 7.3 02/14/2019 2330   ALBUMIN 4.6 02/07/2019 2330   AST 34 02/11/2019 2330   ALT 31 02/16/2019 2330   ALKPHOS 75 02/11/2019 2330   BILITOT 0.7 02/24/2019 2330   GFRNONAA >60 03/08/2019 1628   GFRAA >60 03/08/2019 1628   Lipase     Component Value Date/Time   LIPASE 32 02/24/2019 2330        Studies/Results: Ct Abdomen Pelvis W Contrast  Result Date: 03/07/2019 CLINICAL DATA:  Status post right colectomy, dropping hematocrit, rule out hematoma EXAM: CT ABDOMEN AND PELVIS WITH CONTRAST TECHNIQUE: Multidetector CT imaging of the abdomen and pelvis was performed using the standard protocol following bolus administration of intravenous contrast. CONTRAST:  90mL OMNIPAQUE IOHEXOL 300 MG/ML  SOLN COMPARISON:  10-13-19 FINDINGS: Lower chest: New small bilateral pleural effusions and associated atelectasis. Hepatobiliary: No focal liver abnormality is seen. Status post cholecystectomy. Expected postoperative biliary ductal dilatation. Pancreas: Unremarkable. No pancreatic ductal dilatation or surrounding inflammatory changes. Spleen: Normal in size without focal abnormality. Adrenals/Urinary Tract: Adrenal glands are unremarkable. Kidneys are normal, without renal calculi, focal lesion, or hydronephrosis. Bladder is unremarkable. Stomach/Bowel: Interval postoperative findings of right colectomy. No evidence of bowel obstruction. Vascular/Lymphatic: Mixed calcific atherosclerosis. No enlarged abdominal or pelvic lymph nodes. Reproductive: No mass or other abnormality. Other: No abdominal wall hernia or abnormality. Moderate volume four quadrant ascites with intermediate attenuation, HU = 42, increased compared to prior examination. Postoperative pneumoperitoneum. Musculoskeletal: No acute or significant osseous findings. IMPRESSION: 1.  Status post interval right colectomy. 2. Moderate volume four quadrant ascites with intermediate attenuation, HU = 42, increased compared to prior examination and concerning for hemoperitoneum  given drop in hematocrit. There is no layering attenuation or other direct findings to suggest nidus of hemorrhage. 3.  Postoperative pneumoperitoneum. 4.  Other chronic and incidental findings as detailed above. Electronically Signed   By: Lauralyn Primes M.D.   On: 03/07/2019 11:55     Anti-infectives: Anti-infectives (From admission, onward)   Start     Dose/Rate Route Frequency Ordered Stop   2019-03-27 0515  clindamycin (CLEOCIN) IVPB 900 mg     900 mg 100 mL/hr over 30 Minutes Intravenous To Surgery Mar 27, 2019 0425 2019-03-27 0531   Mar 27, 2019 0515  gentamicin (GARAMYCIN) 210 mg in dextrose 5 % 100 mL IVPB     5 mg/kg  42.2 kg 105.3 mL/hr over 60 Minutes Intravenous STAT 03/27/19 0425 27-Mar-2019 0604       Assessment/Plan Cervical dystonia ABL Anemia - hbg up to 9.3 on 5/1 after 1 unit pRBCs on 4/30 secondary to hemoperitoneum.  Patient refused labs yesterday. Willing to get them this AM. Will repeat CBC. VSS. Hold Lovenox till repeat labs.  Delirium - Appears more appropriate this morning. Minimize sedating/narcotic meds.  Orient her to time of day.  Keep blinds open during the day.  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 Will leave on FLD today.  -Conttylenol & tramadol. Morphine prn  -Needs to mobilize. PT about to work with her this AM.  -Pathis benign with no evidence of colitis or malignant process -Follow up on labs   FEN -IVFs/FLD VTE -Lovenoxheld due to bleeding, SCDs ID -none Foley - DC 4/29 Follow up - Dwain Sarna POC - Husband Raely Hibbert   LOS: 4 days    Jacinto Halim , [facility] Surgery 03/09/2019, 9:51 AM Pager: [phone]

## 2019-03-09 NOTE — Progress Notes (Signed)
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 Pt with delirium post op. PMH - anxiety, depression, cervical dystonia, IBS, cervical spondylosis, osteoporosis, lt wrist fx, rt rotator cuff repair    PT Comments    Patient with c/o of nausea and stomach discomfort after eating breakfast upon entry. Denies ambulation. Session limited due to need to use BSC. Min A x1 to assist to sitting and stand pivoting today. Will cont to progress. In general, more appropriate today however still delirium noted. (asking if she could give her milk from tray to the cleaning staff for her baby she is carrying in hall, no baby present) as well as poor insight believing she is heading home today. Will need to progress cognitively and physically if to d/c home once medically ready for d/c.     Follow Up Recommendations  Home health PT;Supervision/Assistance - 24 hour     Equipment Recommendations  Rolling walker with 5" wheels    Recommendations for Other Services       Precautions / Restrictions Precautions Precautions: Fall Restrictions Weight Bearing Restrictions: No    Mobility  Bed Mobility Overal bed mobility: Needs Assistance Bed Mobility: Rolling;Sidelying to Sit Rolling: Min guard Sidelying to sit: Min assist       General bed mobility comments: Assist to elevate trunk into sitting.  Transfers Overall transfer level: Needs assistance Equipment used: 1 person hand held assist Transfers: Sit to/from [employer];Stand Pivot Transfers Sit to Stand: Min assist Stand pivot transfers: Min assist       General transfer comment: Assist to bring hips up and for balance. [facility]  Ambulation/Gait                 Stairs             Wheelchair Mobility    Modified Rankin (Stroke Patients  Only)       Balance Overall balance assessment: Needs assistance Sitting-balance support: No upper extremity supported;Feet supported Sitting balance-Leahy Scale: Fair     Standing balance support: Single extremity supported Standing balance-Leahy Scale: Poor Standing balance comment: UE support for static standing                            Cognition Arousal/Alertness: Awake/alert Behavior During Therapy: Anxious Overall Cognitive Status: Impaired/Different from baseline Area of Impairment: Orientation;Memory;Following commands;Safety/judgement;Problem solving                 Orientation Level: Disoriented to;Time   Memory: Decreased short-term memory Following Commands: Follows one step commands consistently Safety/Judgement: Decreased awareness of safety;Decreased awareness of deficits   Problem Solving: Requires verbal cues        Exercises      General Comments        Pertinent Vitals/Pain Pain Assessment: Faces Faces Pain Scale: Hurts little more Pain Location: abdomen Pain Descriptors / Indicators: Grimacing;Guarding    Home Living                      Prior Function            PT Goals (current goals can now be found in the care plan section) Acute Rehab PT Goals Patient Stated Goal: go home PT Goal Formulation: With patient Time For Goal Achievement: 03/22/19  Potential to Achieve Goals: Good    Frequency    Min 3X/week      PT Plan      Co-evaluation              AM-PAC PT "6 Clicks" Mobility   Outcome Measure  Help needed turning from your back to your side while in a flat bed without using bedrails?: A Little Help needed moving from lying on your back to sitting on the side of a flat bed without using bedrails?: A Little Help needed moving to and from a bed to a chair (including a wheelchair)?: A Little Help needed standing up from a chair using your arms (e.g., wheelchair or bedside chair)?: A  Little Help needed to walk in hospital room?: A Little Help needed climbing 3-5 steps with a railing? : A Little 6 Click Score: 18    End of Session   Activity Tolerance: Patient tolerated treatment well Patient left: in chair;with call bell/phone within reach;with chair alarm set Nurse Communication: Mobility status;Other (comment)(O2 off and purewick removed) PT Visit Diagnosis: Unsteadiness on feet (R26.81);Muscle weakness (generalized) (M62.81)     Time: 0601-5615 PT Time Calculation (min) (ACUTE ONLY): 15 min  Charges:  $Therapeutic Activity: 8-22 mins                     Etta Grandchild, PT, DPT Acute Rehabilitation Services Pager: [phone] Office: [phone]     Etta Grandchild 03/09/2019, 10:08 AM

## 2019-03-09 NOTE — Plan of Care (Signed)
Pt stable at this time.

## 2019-03-10 LAB — BASIC METABOLIC PANEL
Anion gap: 8 (ref 5–15)
BUN: 6 mg/dL — ABNORMAL LOW (ref 8–23)
CO2: 29 mmol/L (ref 22–32)
Calcium: 8.4 mg/dL — ABNORMAL LOW (ref 8.9–10.3)
Chloride: 96 mmol/L — ABNORMAL LOW (ref 98–111)
Creatinine, Ser: 0.45 mg/dL (ref 0.44–1.00)
GFR calc Af Amer: >60 mL/min (ref >60)
GFR calc non Af Amer: >60 mL/min (ref >60)
Glucose, Bld: 117 mg/dL — ABNORMAL HIGH (ref 70–99)
Potassium: 2.9 mmol/L — ABNORMAL LOW (ref 3.5–5.1)
Sodium: 133 mmol/L — ABNORMAL LOW (ref 135–145)

## 2019-03-10 MED ORDER — POTASSIUM CHLORIDE 10 MEQ/100ML IV SOLN
10.0000 meq | INTRAVENOUS | Status: AC
  Administered 2019-03-10 – 2019-03-11 (×4): 10 meq via INTRAVENOUS
  Filled 2019-03-10 (×3): qty 100

## 2019-03-10 NOTE — Progress Notes (Signed)
    5 Days Post-Op  Subjective: CC: Feeling better Tolerating FLD without n/v. Passing gas; has had BMs since surgery as well. Pain controlled  Objective: Vital signs in last 24 hours: Temp:  [98.8 F (37.1 C)-99.1 F (37.3 C)] 98.8 F (37.1 C) (05/03 0733) Pulse Rate:  [76-86] 86 (05/03 0733) Resp:  [14-18] 14 (05/03 0733) BP: (148-185)/(82-96) 148/88 (05/03 0733) SpO2:  [92 %-99 %] 92 % (05/03 0733) Weight:  [37.7 kg] 37.7 kg (05/03 0500) Last BM Date: 03/19/2019  Intake/Output from previous day: 05/02 0701 - 05/03 0700 In: 1455 [P.O.:480; I.V.:975] Out: 2200 [Urine:2200] Intake/Output this shift: No intake/output data recorded.  PE: Gen: Awake and alert, NAD. Up to bedside commode w/ PT Heart: regular Lungs: CTAB, normal effort  Abd: Soft, mild tenderness around midline. Nondistended. Incision c/d without erythema or drainage  Lab Results:  Recent Labs    03/08/19 1628 03/09/19 1014  WBC 10.4 11.5*  HGB 9.6* 10.8*  HCT 27.6* 31.0*  PLT 228 298   BMET Recent Labs    03/08/19 1628 03/09/19 1014  NA 140 135  K 3.9 2.8*  CL 101 97*  CO2 28 29  GLUCOSE 112* 161*  BUN <5* <5*  CREATININE 0.44 0.64  CALCIUM 8.6* 8.9   PT/INR No results for input(s): LABPROT, INR in the last 72 hours. CMP     Component Value Date/Time   NA 135 03/09/2019 1014   K 2.8 (L) 03/09/2019 1014   CL 97 (L) 03/09/2019 1014   CO2 29 03/09/2019 1014   GLUCOSE 161 (H) 03/09/2019 1014   BUN <5 (L) 03/09/2019 1014   CREATININE 0.64 03/09/2019 1014   CALCIUM 8.9 03/09/2019 1014   PROT 7.3 2019/03/19 2330   ALBUMIN 4.6 Mar 19, 2019 2330   AST 34 Mar 19, 2019 2330   ALT 31 Mar 19, 2019 2330   ALKPHOS 75 03/19/2019 2330   BILITOT 0.7 Mar 19, 2019 2330   GFRNONAA >60 03/09/2019 1014   GFRAA >60 03/09/2019 1014   Lipase     Component Value Date/Time   LIPASE 32 Mar 19, 2019 2330       Studies/Results: No results found.  Anti-infectives: Anti-infectives (From admission, onward)    Start     Dose/Rate Route Frequency Ordered Stop   02/17/2019 0515  clindamycin (CLEOCIN) IVPB 900 mg     900 mg 100 mL/hr over 30 Minutes Intravenous To Surgery 02/17/2019 0425 02/10/2019 0531   02/27/2019 0515  gentamicin (GARAMYCIN) 210 mg in dextrose 5 % 100 mL IVPB     5 mg/kg  42.2 kg 105.3 mL/hr over 60 Minutes Intravenous STAT 02/12/2019 0425 02/08/2019 0604       Assessment/Plan Cervical dystonia ABL Anemia - hbg stable; no evidence of ongoing bleeding. Ok to restart chemical dvt prophylaxis Delirium - Appears more appropriate this morning. Minimize sedating/narcotic meds.  Orient her to time of day.  Keep blinds open during the day.  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 Morphine prn  -Needs to mobilize. PT/OT -Pathis benign with no evidence of colitis or malignant process  VTE -Lovenoxheld due to bleeding, SCDs ID -none Foley - DC 4/29 Follow up - Dana [name] POC - Husband Patrick Veness   LOS: 5 days    Andria Meuse , MD [facility] Surgery 03/10/2019, 8:38 AM Pager: [phone]

## 2019-03-10 NOTE — Progress Notes (Signed)
Nursing staff has paged Surgery pager listed in note from today many times with no call backs. Pt is very lethargic and sleepy compared to yesterday. VSS. Potassium from am labs 2.8. No replacements have been ordered. Unable to reach MD. Will continue to attempt contact with service and monitor patient closely. Pt remains on tele with safety sitter at bedside. If conditions deteriorate with patient, rapid response will be called.

## 2019-03-10 NOTE — Progress Notes (Signed)
Potassium level 2.8. Attending paged x2 no response.

## 2019-03-11 LAB — BASIC METABOLIC PANEL
Anion gap: 8 (ref 5–15)
BUN: 7 mg/dL — ABNORMAL LOW (ref 8–23)
CO2: 28 mmol/L (ref 22–32)
Calcium: 8.4 mg/dL — ABNORMAL LOW (ref 8.9–10.3)
Chloride: 99 mmol/L (ref 98–111)
Creatinine, Ser: 0.56 mg/dL (ref 0.44–1.00)
GFR calc Af Amer: >60 mL/min (ref >60)
GFR calc non Af Amer: >60 mL/min (ref >60)
Glucose, Bld: 101 mg/dL — ABNORMAL HIGH (ref 70–99)
Potassium: 3.4 mmol/L — ABNORMAL LOW (ref 3.5–5.1)
Sodium: 135 mmol/L (ref 135–145)

## 2019-03-11 LAB — MAGNESIUM: Magnesium: 1.6 mg/dL — ABNORMAL LOW (ref 1.7–2.4)

## 2019-03-11 MED ORDER — ENOXAPARIN SODIUM 30 MG/0.3ML ~~LOC~~ SOLN
30.0000 mg | SUBCUTANEOUS | Status: DC
  Administered 2019-03-14 – 2019-03-21 (×7): 30 mg via SUBCUTANEOUS
  Filled 2019-03-11 (×9): qty 0.3

## 2019-03-11 MED ORDER — MORPHINE SULFATE (PF) 2 MG/ML IV SOLN
2.0000 mg | INTRAVENOUS | Status: DC | PRN
  Administered 2019-03-17 – 2019-03-21 (×9): 2 mg via INTRAVENOUS
  Filled 2019-03-11 (×9): qty 1

## 2019-03-11 MED ORDER — ENSURE ENLIVE PO LIQD
237.0000 mL | Freq: Two times a day (BID) | ORAL | Status: DC
  Administered 2019-03-11: 237 mL via ORAL

## 2019-03-11 MED ORDER — TRAMADOL HCL 50 MG PO TABS
50.0000 mg | ORAL_TABLET | Freq: Four times a day (QID) | ORAL | Status: DC
  Administered 2019-03-11 – 2019-03-17 (×21): 50 mg via ORAL
  Filled 2019-03-11 (×23): qty 1

## 2019-03-11 MED ORDER — POTASSIUM CHLORIDE 10 MEQ/100ML IV SOLN
INTRAVENOUS | Status: AC
  Administered 2019-03-11: 10 meq via INTRAVENOUS
  Filled 2019-03-11: qty 100

## 2019-03-11 MED ORDER — MAGNESIUM SULFATE 2 GM/50ML IV SOLN
2.0000 g | Freq: Once | INTRAVENOUS | Status: AC
  Administered 2019-03-11: 2 g via INTRAVENOUS
  Filled 2019-03-11: qty 50

## 2019-03-11 MED ORDER — DOCUSATE SODIUM 100 MG PO CAPS
100.0000 mg | ORAL_CAPSULE | Freq: Two times a day (BID) | ORAL | Status: DC
  Administered 2019-03-11 – 2019-03-16 (×10): 100 mg via ORAL
  Filled 2019-03-11 (×11): qty 1

## 2019-03-11 MED ORDER — POTASSIUM CHLORIDE CRYS ER 20 MEQ PO TBCR
40.0000 meq | EXTENDED_RELEASE_TABLET | Freq: Once | ORAL | Status: AC
  Administered 2019-03-11: 40 meq via ORAL
  Filled 2019-03-11: qty 2

## 2019-03-11 MED ORDER — PROMETHAZINE HCL 25 MG/ML IJ SOLN
12.5000 mg | Freq: Once | INTRAMUSCULAR | Status: AC
  Administered 2019-03-11: 12.5 mg via INTRAVENOUS
  Filled 2019-03-11: qty 1

## 2019-03-11 NOTE — Care Management Important Message (Signed)
Important Message  Patient Details  Name: Dana [name] MRN: [id number] Date of Birth: 04-14-1946   Medicare Important Message Given:  Yes    Tysheena Ginzburg Stefan Church 03/11/2019, 3:44 PM

## 2019-03-11 NOTE — TOC Progression Note (Addendum)
Transition of Care [facility]) - Progression Note    Patient Details  Name: Dana [name] MRN: [id number] Date of Birth: 08-14-1946  Transition of Care [facility]) CM/SW Contact  Leone Haven, RN Phone Number: 03/11/2019, 12:56 PM  Clinical Narrative:    NCM received message from spouse, Greggory Stallion, NCM returned call, he states he has a rolling walker that patient can use so they do not need a rolling walker, NCM offered choice for HHRN/HHPT, HHOT he states it does not matter which company,he can go with [facility], list on chart, referral made to [facility] with [facility]  For HHRN/HHPT/HHOT.  She will call office to see if they can take the referral.  Per Lupita Leash they are able to accept this referral.    Expected Discharge Plan: Home w Home Health Services Barriers to Discharge: No Barriers Identified  Expected Discharge Plan and Services Expected Discharge Plan: Home w Home Health Services In-house Referral: NA Discharge Planning Services: CM Consult Post Acute Care Choice: Home Health Living arrangements for the past 2 months: Single Family Home                 DME Arranged: N/A DME Agency: NA       HH Arranged: RN, PT, OT HH Agency: [facility] Date HH Agency Contacted: 03/11/19 Time HH Agency Contacted: 1232 Representative spoke with at [facility] Agency: Lupita Leash   Social Determinants of Health (SDOH) Interventions    Readmission Risk Interventions Readmission Risk Prevention Plan 03/11/2019  Transportation Screening Complete  Some recent data might be hidden

## 2019-03-11 NOTE — Progress Notes (Signed)
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 Pt with delirium post op. PMH - anxiety, depression, cervical dystonia, IBS, cervical spondylosis, osteoporosis, lt wrist fx, rt rotator cuff repair    PT Comments    Initially pt trying to put me off and stating a later time would be better. I explained to her that I was not arranging a later time because last week she did that to other therapist and then would ultimately refuse no matter when they came back. I explained we needed to get up so she could regain strength to return home and facilitate GI recovery. She agreed and was appreciative at end of session. Remains debilitated and will need 24 hour assist at home. Pt states her husband is in good health and can assist her.    Follow Up Recommendations  Home health PT;Supervision/Assistance - 24 hour     Equipment Recommendations  Rolling walker with 5" wheels    Recommendations for Other Services       Precautions / Restrictions Precautions Precautions: Fall Restrictions Weight Bearing Restrictions: No    Mobility  Bed Mobility Overal bed mobility: Needs Assistance Bed Mobility: Rolling;Sidelying to Sit Rolling: Min guard Sidelying to sit: Min assist       General bed mobility comments: Assist to elevate trunk into sitting.  Transfers Overall transfer level: Needs assistance Equipment used: Rolling walker (2 wheeled) Transfers: Sit to/from Stand Sit to Stand: Min assist         General transfer comment: Assist to bring hips up and for balance.  Ambulation/Gait Ambulation/Gait assistance: Min assist Gait Distance (Feet): 175 Feet Assistive device: Rolling walker (2 wheeled) Gait Pattern/deviations: Narrow base of support;Trunk flexed;Step-to pattern;Decreased step length - right;Decreased  step length - left;Shuffle;Drifts right/left Gait velocity: decr Gait velocity interpretation: <1.31 ft/sec, indicative of household ambulator General Gait Details: Assist for balance. Verbal cues to lengthen steps without much success. Initially pt using walker but walker pulling to the rt. Switched to hand held to alleviate the drift to the rt.   Stairs             Wheelchair Mobility    Modified Rankin (Stroke Patients Only)       Balance Overall balance assessment: Needs assistance Sitting-balance support: No upper extremity supported;Feet supported Sitting balance-Leahy Scale: Fair     Standing balance support: Single extremity supported Standing balance-Leahy Scale: Poor Standing balance comment: UE support for static standing                            Cognition Arousal/Alertness: Awake/alert Behavior During Therapy: Anxious Overall Cognitive Status: Impaired/Different from baseline Area of Impairment: Memory                     Memory: Decreased short-term memory         General Comments: Pt does not recall much of last week.      Exercises      General Comments        Pertinent Vitals/Pain Pain Assessment: Faces Faces Pain Scale: Hurts little more Pain Location: abdomen Pain Descriptors / Indicators: Grimacing;Guarding Pain Intervention(s): Limited activity within patient's tolerance;Monitored during session;Premedicated before session;Repositioned    Home Living  Prior Function            PT Goals (current goals can now be found in the care plan section) Progress towards PT goals: Progressing toward goals    Frequency    Min 3X/week      PT Plan Current plan remains appropriate    Co-evaluation              AM-PAC PT "6 Clicks" Mobility   Outcome Measure  Help needed turning from your back to your side while in a flat bed without using bedrails?: A Little Help needed  moving from lying on your back to sitting on the side of a flat bed without using bedrails?: A Little Help needed moving to and from a bed to a chair (including a wheelchair)?: A Little Help needed standing up from a chair using your arms (e.g., wheelchair or bedside chair)?: A Little Help needed to walk in hospital room?: A Little Help needed climbing 3-5 steps with a railing? : A Little 6 Click Score: 18    End of Session   Activity Tolerance: Patient tolerated treatment well Patient left: in chair;with call bell/phone within reach;with chair alarm set Nurse Communication: Mobility status PT Visit Diagnosis: Unsteadiness on feet (R26.81);Muscle weakness (generalized) (M62.81)     Time: 1610-9604 PT Time Calculation (min) (ACUTE ONLY): 31 min  Charges:  $Gait Training: 23-37 mins                     [name] PT Acute Rehabilitation Services Pager [phone] Office [phone]    [name] 03/11/2019, 11:38 AM

## 2019-03-11 NOTE — Progress Notes (Signed)
6 Days Post-Op  Subjective: CC: Abdominal pain Patient reports abdominal pain near her midline wound, worse with moving. Just received morphine. On soft diet. Tolerating, but nurse reports she has only ate applesauce. No N/V. Passing some flatus. No BM. Working with PT who recommends HH.  Objective: Vital signs in last 24 hours: Temp:  [98.1 F (36.7 C)-99.3 F (37.4 C)] 98.1 F (36.7 C) (05/04 0751) Pulse Rate:  [80-91] 91 (05/04 0751) Resp:  [12-17] 17 (05/04 0751) BP: (157-176)/(85-93) 165/85 (05/04 0751) SpO2:  [94 %-97 %] 95 % (05/04 0751) Weight:  [41.5 kg] 41.5 kg (05/04 0512) Last BM Date: Mar 20, 2019  Intake/Output from previous day: 05/03 0701 - 05/04 0700 In: 1240 [P.O.:840; IV Piggyback:400] Out: 1000 [Urine:1000] Intake/Output this shift: No intake/output data recorded.  PE: Gen: Awake and alert, NAD Heart: regular Lungs: CTAB, normal effort  Abd: Soft, mild tenderness around midline. Nondistended. Incision c/d with healthy granulation tissue. No erythema or drainage. See picture below.      Lab Results:  Recent Labs    03/08/19 1628 03/09/19 1014  WBC 10.4 11.5*  HGB 9.6* 10.8*  HCT 27.6* 31.0*  PLT 228 298   BMET Recent Labs    03/10/19 2020 03/11/19 0617  NA 133* 135  K 2.9* 3.4*  CL 96* 99  CO2 29 28  GLUCOSE 117* 101*  BUN 6* 7*  CREATININE 0.45 0.56  CALCIUM 8.4* 8.4*   PT/INR No results for input(s): LABPROT, INR in the last 72 hours. CMP     Component Value Date/Time   NA 135 03/11/2019 0617   K 3.4 (L) 03/11/2019 0617   CL 99 03/11/2019 0617   CO2 28 03/11/2019 0617   GLUCOSE 101 (H) 03/11/2019 0617   BUN 7 (L) 03/11/2019 0617   CREATININE 0.56 03/11/2019 0617   CALCIUM 8.4 (L) 03/11/2019 0617   PROT 7.3 03/20/19 2330   ALBUMIN 4.6 Mar 20, 2019 2330   AST 34 03-20-19 2330   ALT 31 03/20/2019 2330   ALKPHOS 75 2019/03/20 2330   BILITOT 0.7 03-20-2019 2330   GFRNONAA >60 03/11/2019 0617   GFRAA >60 03/11/2019 0617    Lipase     Component Value Date/Time   LIPASE 32 20-Mar-2019 2330       Studies/Results: No results found.  Anti-infectives: Anti-infectives (From admission, onward)   Start     Dose/Rate Route Frequency Ordered Stop   02/06/2019 0515  clindamycin (CLEOCIN) IVPB 900 mg     900 mg 100 mL/hr over 30 Minutes Intravenous To Surgery 02/17/2019 0425 02/10/2019 0531   02/26/2019 0515  gentamicin (GARAMYCIN) 210 mg in dextrose 5 % 100 mL IVPB     5 mg/kg  42.2 kg 105.3 mL/hr over 60 Minutes Intravenous STAT 03/07/2019 0425 02/20/2019 0604       Assessment/Plan Cervical dystonia ABLAnemia - hbg stable., 10.8 on 5/2; no evidence of ongoing bleeding.  Delirium - Appears more appropriate this morning.Minimize sedating/narcotic meds. Orient her to time of day. Keep blinds open during the day.  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 Morphine prn.  -Needs to mobilize. PT/OT -Pathis benign with no evidence of colitis or malignant process  FEN - Soft, ensure, bowel regimen, K 3.4 (replace, Mg pending) VTE -Lovenox, SCDs ID -Clinda/Gent periop Foley - DC 4/29 Follow up - Dwain Sarna POC - Husband Maxime Neef  Dispo: Pt only eating applesauce. Encouraged eating. Added ensure.  Replace K, f/u Mg. Adjusted pain medication to try to limit IV pain medication. Mobilize with PT. Will follow up on OT recs.     LOS: 6 days    Jacinto HalimMichael M  , [facility] 03/11/2019, 8:44 AM Pager: [phone]

## 2019-03-11 NOTE — Progress Notes (Addendum)
Pt currently vomiting a moderate amount. Pt wants the provider to come visit her now. Pt made aware that provider will be notified of incident and nurse will give PRN med for n/v. Pt has poor appetite and is eating less than 30% of foods.   Provider called back and placed VORB for DG abdomen in AM, change diet to NPO (sips with meds and ice chips), phenergan 12.5 mg IV once.

## 2019-03-12 ENCOUNTER — Inpatient Hospital Stay (HOSPITAL_COMMUNITY): Payer: Medicare Other

## 2019-03-12 LAB — BASIC METABOLIC PANEL
Anion gap: 10 (ref 5–15)
BUN: 14 mg/dL (ref 8–23)
CO2: 27 mmol/L (ref 22–32)
Calcium: 8.4 mg/dL — ABNORMAL LOW (ref 8.9–10.3)
Chloride: 99 mmol/L (ref 98–111)
Creatinine, Ser: 0.53 mg/dL (ref 0.44–1.00)
Glucose, Bld: 116 mg/dL — ABNORMAL HIGH (ref 70–99)
Potassium: 3.5 mmol/L (ref 3.5–5.1)
Sodium: 136 mmol/L (ref 135–145)

## 2019-03-12 LAB — CREATININE, SERUM
Creatinine, Ser: 0.64 mg/dL (ref 0.44–1.00)
GFR calc Af Amer: >60 mL/min (ref >60)
GFR calc non Af Amer: >60 mL/min (ref >60)

## 2019-03-12 LAB — MAGNESIUM: Magnesium: 1.8 mg/dL (ref 1.7–2.4)

## 2019-03-12 MED ORDER — SIMETHICONE 80 MG PO CHEW
80.0000 mg | CHEWABLE_TABLET | Freq: Four times a day (QID) | ORAL | Status: DC
  Administered 2019-03-12 – 2019-03-16 (×16): 80 mg via ORAL
  Filled 2019-03-12 (×19): qty 1

## 2019-03-12 MED ORDER — HYDRALAZINE HCL 20 MG/ML IJ SOLN
10.0000 mg | Freq: Four times a day (QID) | INTRAMUSCULAR | Status: DC | PRN
  Administered 2019-03-12 – 2019-03-21 (×10): 10 mg via INTRAVENOUS
  Filled 2019-03-12 (×10): qty 1

## 2019-03-12 MED ORDER — PROMETHAZINE HCL 25 MG/ML IJ SOLN
12.5000 mg | Freq: Four times a day (QID) | INTRAMUSCULAR | Status: DC | PRN
  Administered 2019-03-15 – 2019-03-19 (×4): 12.5 mg via INTRAVENOUS
  Filled 2019-03-12 (×5): qty 1

## 2019-03-12 MED ORDER — ALUM & MAG HYDROXIDE-SIMETH 200-200-20 MG/5ML PO SUSP: 15.0000 mL | Freq: Four times a day (QID) | ORAL | Status: DC | PRN

## 2019-03-12 NOTE — Care Management Important Message (Signed)
Important Message  Patient Details  Name: Dana [name] MRN: [id number] Date of Birth: 1946-10-22   Medicare Important Message Given:  Yes    Leone Haven, RN 03/12/2019, 9:53 AM

## 2019-03-12 NOTE — Progress Notes (Addendum)
7 Days Post-Op  Subjective: CC: Nauseated  Patient reports that she became nauseated last night, into this morning. 2 episodes of emesis last night and in the early AM . She also notes distension but continues to pass flatus and had a small loose BM this morning. Notes abdominal pain that is more controlled today and around her midline wound. Did not require IV pain medication after scheduling Tylenol and Ultram yesterday. Labs and KUB are pending this AM. Patient is about to work with OT. No fever, SOB, dysuria.   Objective: Vital signs in last 24 hours: Temp:  [98.5 F (36.9 C)-98.8 F (37.1 C)] 98.5 F (36.9 C) (05/05 0445) Pulse Rate:  [92-142] 95 (05/05 0445) Resp:  [17-18] 17 (05/04 1527) BP: (152-178)/(80-96) 176/92 (05/05 0445) SpO2:  [84 %-96 %] 96 % (05/05 0445) Weight:  [43.6 kg] 43.6 kg (05/05 0445) Last BM Date: 03/10/19("per patient")  Intake/Output from previous day: 05/04 0701 - 05/05 0700 In: -  Out: 2200 [Urine:1850; Emesis/NG output:350] Intake/Output this shift: No intake/output data recorded.  PE: Gen: Awake and alert, NAD Heart: regular, 90's while I was in the room on monitor  Lungs: CTAB, normal effort  Abd: Soft, generalized tenderness, greatest around midline without peritonitis. Moderate distension. Normoactive to slightly hypoactive BS. Wound dressed this AM already.  Msk: no edmea  Lab Results:  Recent Labs    03/09/19 1014  WBC 11.5*  HGB 10.8*  HCT 31.0*  PLT 298   BMET Recent Labs    03/10/19 2020 03/11/19 0617 03/12/19 0507  NA 133* 135  --   K 2.9* 3.4*  --   CL 96* 99  --   CO2 29 28  --   GLUCOSE 117* 101*  --   BUN 6* 7*  --   CREATININE 0.45 0.56 0.64  CALCIUM 8.4* 8.4*  --    PT/INR No results for input(s): LABPROT, INR in the last 72 hours. CMP     Component Value Date/Time   NA 135 03/11/2019 0617   K 3.4 (L) 03/11/2019 0617   CL 99 03/11/2019 0617   CO2 28 03/11/2019 0617   GLUCOSE 101 (H) 03/11/2019 0617    BUN 7 (L) 03/11/2019 0617   CREATININE 0.64 03/12/2019 0507   CALCIUM 8.4 (L) 03/11/2019 0617   PROT 7.3 2019-08-31 2330   ALBUMIN 4.6 2019-08-31 2330   AST 34 2019-08-31 2330   ALT 31 2019-08-31 2330   ALKPHOS 75 2019-08-31 2330   BILITOT 0.7 2019-08-31 2330   GFRNONAA >60 03/12/2019 0507   GFRAA >60 03/12/2019 0507   Lipase     Component Value Date/Time   LIPASE 32 2019-08-31 2330       Studies/Results: No results found.  Anti-infectives: Anti-infectives (From admission, onward)   Start     Dose/Rate Route Frequency Ordered Stop   02/26/2019 0515  clindamycin (CLEOCIN) IVPB 900 mg     900 mg 100 mL/hr over 30 Minutes Intravenous To Surgery 02/10/2019 0425 03/06/2019 0531   03/06/2019 0515  gentamicin (GARAMYCIN) 210 mg in dextrose 5 % 100 mL IVPB     5 mg/kg  42.2 kg 105.3 mL/hr over 60 Minutes Intravenous STAT 02/19/2019 0425 03/07/2019 0604       Assessment/Plan Cervical dystonia ABLAnemia - hbgstable, 10.8 on 5/2; repeat CBC this AM pending. Delirium - She continues to be appropriate this morning.Minimize sedating/narcotic meds. Orient her to time of day. Keep blinds open during the day.  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 NPO currently. KUB and labs pending. - Add phenergan for refractory N/V. Simethicone  for bloating.  - Continue scheduled Tylenol &tramadol. Morphine prn - Needs to mobilize.PT/OT - IS - Pathis benign with no evidence of colitis or malignant process  FEN - NPO except sips w/ meds, IVF, electrolytes pending  VTE -Lovenox, SCDs ID -Clinda/Gent periop Foley - DC 4/29 Follow up - Dwain Sarna POC - Husband Lanney Gins  Dispo: PT with N/V and bloating overnightthat has continued into the morning. Abdominal pain is now generalized but controlled with oral pain medication regimen. No peritonitis. Maximize nausea medication. Add Simethicone. Labs pending this AM.  Leave NPO till KUB results. She is about to work with OT. PT recommended HH.    LOS: 7 days    Jacinto Halim , [facility] 03/12/2019, 8:06 AM Pager: [phone]

## 2019-03-12 NOTE — Progress Notes (Signed)
When entering patient's room, patient was confused. Pt was out of bed, walking in room with unsteady gait. Pt attempted to call 911 because she was not feeling well. Nurse attempted to redirect patient. Pt placed in chair with chair alarm in place. Pt called family and nurse also spoke to family. Order for tele sitter placed.

## 2019-03-12 NOTE — Evaluation (Signed)
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 Pt with delirium post op. PMH - anxiety, depression, cervical dystonia, IBS, cervical spondylosis, osteoporosis, lt wrist fx, rt rotator cuff repair   Clinical Impression   PTA, pt was living with her husband and was perform BADLs; pt reports her husband was performing IADLs due to fatigue and pain at back and stomach. Pt currently requiring Min Guard A for UB ADLs, Min-Mod A for LB ADLs, and Min A for functional mobility. Pt presenting with fatigue and increased pain. During session, pt continues to report that she felt she needed to have a BM and was going to vomit; pt did neither during session. VSS stable; however, BP high and RN notified. Pt would benefit from further acute OT to facilitate safe dc. Recommend dc home with HHOT to increase safety, independence with ADLs, and facilitate return to PLOF.      Follow Up Recommendations  Home health OT;Supervision/Assistance - 24 hour    Equipment Recommendations  Tub/shower seat    Recommendations for Other Services PT consult     Precautions / Restrictions Precautions Precautions: Fall      Mobility Bed Mobility Overal bed mobility: Needs Assistance Bed Mobility: Rolling;Sidelying to Sit Rolling: Min guard Sidelying to sit: Min guard       General bed mobility comments: Min Guard A for safety  Transfers Overall transfer level: Needs assistance Equipment used: None Transfers: Sit to/from Stand Sit to Stand: Min guard;Min assist         General transfer comment: Min Guard A for safety with standing and then Min A for single hand held once standing    Balance Overall balance assessment: Needs assistance Sitting-balance support: No upper extremity supported;Feet supported Sitting balance-Leahy  Scale: Fair     Standing balance support: Single extremity supported Standing balance-Leahy Scale: Poor Standing balance comment: UE support for dynamic balance                           ADL either performed or assessed with clinical judgement   ADL Overall ADL's : Needs assistance/impaired Eating/Feeding: NPO   Grooming: Wash/dry face;Wash/dry hands;Brushing hair;Oral care;Min guard;Standing Grooming Details (indicate cue type and reason): Min Guard A for safety. Pt requiring seated rest break due to fatigue and back pain Upper Body Bathing: Min guard;Sitting   Lower Body Bathing: Minimal assistance;Sit to/from stand   Upper Body Dressing : Min guard;Sitting   Lower Body Dressing: Moderate assistance;Sit to/from stand   Toilet Transfer: Min guard;Ambulation;Regular Toilet;Grab bars   Toileting- Clothing Manipulation and Hygiene: Min guard;Sit to/from stand       Functional mobility during ADLs: Min guard General ADL Comments: Pt presenting with decreased activity tolerance due to pain and fatigue     Vision         Perception     Praxis      Pertinent Vitals/Pain Pain Assessment: Faces Faces Pain Scale: Hurts little more Pain Location: abdomen Pain Descriptors / Indicators: Grimacing;Guarding Pain Intervention(s): Monitored during session;Limited activity within patient's tolerance;Repositioned     Hand Dominance Right   Extremity/Trunk Assessment Upper Extremity Assessment Upper Extremity Assessment: Generalized weakness   Lower Extremity Assessment Lower Extremity Assessment: Generalized weakness   Cervical / Trunk Assessment Cervical / Trunk Assessment: Kyphotic;Other exceptions Cervical / Trunk  Exceptions: Chronic back pain   Communication Communication Communication: No difficulties   Cognition Arousal/Alertness: Awake/alert Behavior During Therapy: Anxious Overall Cognitive Status: Impaired/Different from baseline Area of  Impairment: Problem solving;Awareness;Memory                     Memory: Decreased short-term memory     Awareness: Emergent Problem Solving: Requires verbal cues General Comments: Pt with difficulty recalling surgery and post-op. Requiring increrased time and cues.; Agreeable to therapy and receptive of education on OOB importance post sx   General Comments  VSS through out. BP high and notified RN. SpO2 96% on RA. HR 99. BP before session 185/95 and end of session 193/99    Exercises     Shoulder Instructions      Home Living Family/patient expects to be discharged to:: Private residence Living Arrangements: Spouse/significant other Available Help at Discharge: Family;Available 24 hours/day Type of Home: House Home Access: Stairs to enter Entergy CorporationEntrance Stairs-Number of Steps: 3 Entrance Stairs-Rails: Right Home Layout: One level     Bathroom Shower/Tub: Chief Strategy OfficerTub/shower unit   Bathroom Toilet: Standard     Home Equipment: None          Prior Functioning/Environment Level of Independence: Independent        Comments: Husband has been  doing IADLs recently due to back and stomach pain. Pt does ADLs        OT Problem List: Decreased strength;Decreased activity tolerance;Decreased range of motion;Impaired balance (sitting and/or standing);Decreased knowledge of use of DME or AE;Decreased knowledge of precautions;Cardiopulmonary status limiting activity;Pain      OT Treatment/Interventions: Self-care/ADL training;Therapeutic exercise;Energy conservation;DME and/or AE instruction;Therapeutic activities;Balance training    OT Goals(Current goals can be found in the care plan section) Acute Rehab OT Goals Patient Stated Goal: go home OT Goal Formulation: With patient Time For Goal Achievement: 03/26/19 Potential to Achieve Goals: Good  OT Frequency: Min 3X/week   Barriers to D/C:            Co-evaluation              AM-PAC OT "6 Clicks" Daily Activity      Outcome Measure Help from another person eating meals?: Total Help from another person taking care of personal grooming?: A Little Help from another person toileting, which includes using toliet, bedpan, or urinal?: A Little Help from another person bathing (including washing, rinsing, drying)?: A Lot Help from another person to put on and taking off regular upper body clothing?: A Little Help from another person to put on and taking off regular lower body clothing?: A Lot 6 Click Score: 14   End of Session Nurse Communication: Mobility status  Activity Tolerance: Patient tolerated treatment well;Patient limited by pain Patient left: in chair;with call bell/phone within reach;with chair alarm set  OT Visit Diagnosis: Unsteadiness on feet (R26.81);Other abnormalities of gait and mobility (R26.89);Muscle weakness (generalized) (M62.81)                Time: 1610-96040741-0828 OT Time Calculation (min): 47 min Charges:  OT General Charges $OT Visit: 1 Visit OT Evaluation $OT Eval Moderate Complexity: 1 Mod OT Treatments $Self Care/Home Management : 23-37 mins  Raney Koeppen MSOT, OTR/L Acute Rehab Pager: [phone] Office: [phone]  Theodoro GristCharis M Keana Dueitt 03/12/2019, 9:49 AM

## 2019-03-12 NOTE — Progress Notes (Signed)
Pt verbalized nausea medication only lasting for about an hour. Pt states has constant nausea with heartburn. Abdomen soft , tender by soft palpation. Pt placed at 70 degree angle to ease symptoms. Pt felt like she needed to have a bowel movement but was unable to defecate. Pt witnessed straining and was educated not to strain.

## 2019-03-12 NOTE — Progress Notes (Signed)
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 Pt with delirium post op. PMH - anxiety, depression, cervical dystonia, IBS, cervical spondylosis, osteoporosis, lt wrist fx, rt rotator cuff repair    PT Comments    Pt with improvement in mobility. Much more eager to get up today. Did better with the rollator than the rolling walker. Will see if the walker available at home is a 2 wheeled walker or a rollator.    Follow Up Recommendations  Home health PT;Supervision/Assistance - 24 hour     Equipment Recommendations  Other (comment)(rollator)    Recommendations for Other Services       Precautions / Restrictions Precautions Precautions: Fall Restrictions Weight Bearing Restrictions: No    Mobility  Bed Mobility               General bed mobility comments: Pt coming out of bathroom  Transfers Overall transfer level: Needs assistance Equipment used: 4-wheeled walker;1 person hand held assist Transfers: Sit to/from Stand Sit to Stand: Min guard         General transfer comment: Assist for safety  Ambulation/Gait Ambulation/Gait assistance: Min guard Gait Distance (Feet): 200 Feet Assistive device: 4-wheeled walker;1 person hand held assist Gait Pattern/deviations: Narrow base of support;Trunk flexed;Step-to pattern;Decreased stride length Gait velocity: decr Gait velocity interpretation: 1.31 - 2.62 ft/sec, indicative of limited community ambulator General Gait Details: Assist for balance and safety. Pt able to handle rollator better than walker. Amb in room and to bathroom with hand held due to small environment   Stairs             Wheelchair Mobility    Modified Rankin (Stroke Patients Only)       Balance Overall balance assessment: Needs assistance Sitting-balance  support: No upper extremity supported;Feet supported Sitting balance-Leahy Scale: Fair     Standing balance support: No upper extremity supported;During functional activity Standing balance-Leahy Scale: Fair Standing balance comment: static standing with supervision                            Cognition Arousal/Alertness: Awake/alert Behavior During Therapy: Anxious Overall Cognitive Status: Impaired/Different from baseline Area of Impairment: Memory;Safety/judgement;Problem solving                     Memory: Decreased short-term memory   Safety/Judgement: Decreased awareness of deficits;Decreased awareness of safety Awareness: Anticipatory Problem Solving: Requires verbal cues        Exercises      General Comments        Pertinent Vitals/Pain Pain Assessment: Faces Faces Pain Scale: Hurts little more Pain Location: abdomen Pain Descriptors / Indicators: Grimacing;Guarding Pain Intervention(s): Monitored during session    Home Living                      Prior Function            PT Goals (current goals can now be found in the care plan section) Progress towards PT goals: Progressing toward goals;Goals met and updated - see care plan    Frequency    Min 3X/week      PT Plan Current plan remains appropriate    Co-evaluation              AM-PAC PT "  6 Clicks" Mobility   Outcome Measure  Help needed turning from your back to your side while in a flat bed without using bedrails?: None Help needed moving from lying on your back to sitting on the side of a flat bed without using bedrails?: A Little Help needed moving to and from a bed to a chair (including a wheelchair)?: A Little Help needed standing up from a chair using your arms (e.g., wheelchair or bedside chair)?: A Little Help needed to walk in hospital room?: A Little Help needed climbing 3-5 steps with a railing? : A Little 6 Click Score: 19    End of Session    Activity Tolerance: Patient tolerated treatment well Patient left: in chair;with call bell/phone within reach;with chair alarm set Nurse Communication: Mobility status PT Visit Diagnosis: Unsteadiness on feet (R26.81);Muscle weakness (generalized) (M62.81)     Time: 4196-2229 PT Time Calculation (min) (ACUTE ONLY): 26 min  Charges:  $Gait Training: 23-37 mins                     [place] Pager [phone] Office [place] 03/12/2019, 1:16 PM

## 2019-03-12 NOTE — Progress Notes (Signed)
Medication effective. Pt verbalized "feeling better." Pt remains NPO.

## 2019-03-12 NOTE — Progress Notes (Signed)
Pt assisted to bedside commode where she had a bowel movement, moderate amount of brown stool with undigested material. It appeared to have a white pill in whole in stool. Pt verbalized some abdominal relief after bowel movement. Peri-care performed. Clean purewick placed. Pt assisted back in bed. Call bell within reach, bed alarm placed.

## 2019-03-13 ENCOUNTER — Inpatient Hospital Stay (HOSPITAL_COMMUNITY): Payer: Medicare Other

## 2019-03-13 LAB — CBC
HCT: 29.7 % — ABNORMAL LOW (ref 36.0–46.0)
Hemoglobin: 10.1 g/dL — ABNORMAL LOW (ref 12.0–15.0)
MCH: 32.3 pg (ref 26.0–34.0)
MCHC: 34 g/dL (ref 30.0–36.0)
MCV: 94.9 fL (ref 80.0–100.0)
Platelets: 436 10*3/uL — ABNORMAL HIGH (ref 150–400)
RBC: 3.13 MIL/uL — ABNORMAL LOW (ref 3.87–5.11)
RDW: 15.2 % (ref 11.5–15.5)
WBC: 15.8 10*3/uL — ABNORMAL HIGH (ref 4.0–10.5)
nRBC: 0 % (ref 0.0–0.2)

## 2019-03-13 LAB — BASIC METABOLIC PANEL
Anion gap: 13 (ref 5–15)
BUN: 14 mg/dL (ref 8–23)
CO2: 24 mmol/L (ref 22–32)
Calcium: 8.6 mg/dL — ABNORMAL LOW (ref 8.9–10.3)
Chloride: 100 mmol/L (ref 98–111)
Creatinine, Ser: 0.63 mg/dL (ref 0.44–1.00)
GFR calc Af Amer: >60 mL/min (ref >60)
GFR calc non Af Amer: >60 mL/min (ref >60)
Glucose, Bld: 86 mg/dL (ref 70–99)
Potassium: 3.4 mmol/L — ABNORMAL LOW (ref 3.5–5.1)
Sodium: 137 mmol/L (ref 135–145)

## 2019-03-13 MED ORDER — SODIUM CHLORIDE 0.9% FLUSH: 10.0000 mL | INTRAVENOUS | Status: DC | PRN

## 2019-03-13 MED ORDER — POTASSIUM CHLORIDE 10 MEQ/100ML IV SOLN
10.0000 meq | INTRAVENOUS | Status: AC
  Administered 2019-03-13 (×4): 10 meq via INTRAVENOUS
  Filled 2019-03-13 (×4): qty 100

## 2019-03-13 MED ORDER — IOHEXOL 300 MG/ML  SOLN
100.0000 mL | Freq: Once | INTRAMUSCULAR | Status: AC | PRN
  Administered 2019-03-13: 100 mL via INTRAVENOUS

## 2019-03-13 MED ORDER — SODIUM CHLORIDE 0.9% FLUSH
10.0000 mL | Freq: Two times a day (BID) | INTRAVENOUS | Status: DC
  Administered 2019-03-13 – 2019-03-17 (×7): 10 mL

## 2019-03-13 NOTE — Progress Notes (Signed)
Initial Nutrition Assessment  DOCUMENTATION CODES:   Underweight  INTERVENTION:   If unable to have diet advanced, would consider nutrition support given underweight status and poor PO intakes for 9 days now.  Will monitor plan of care  NUTRITION DIAGNOSIS:   Inadequate oral intake related to inability to eat(ileus) as evidenced by NPO status.  GOAL:   Patient will meet greater than or equal to 90% of their needs  MONITOR:   Diet advancement, Labs, Weight trends, I & O's  REASON FOR ASSESSMENT:   NPO/Clear Liquid Diet, LOS(Low BMI)    ASSESSMENT:   76 yof with history of cervical dystonia and "colitis" for several years that she has lost fair amount of weight. She has alternating diarrhea and constipation.  Admitted for cecal volvulus.  4/28- admitted for cecal volvulus, s/p Open right hemicolectomy 4/29- diet advanced to Clear liquids 4/30- clear liquids to full liquids 5/1- NPO to full liquids 5/3 - soft diet 5/4- pt made NPO following episodes of N/V 5/5 - abdominal x-ray revealed post-op ileus  **RD working remotely**  Per chart review, pt reported at admission abdominal pain and N/V beginning 4/27. Following surgery pt tolerated diet advancement to soft diet until 5/4 when she began to have N/V overnight -emesis described to be green with undigested food. Pt now NPO with post-op ileus. Per surgery note, pt has continued to have nausea but no vomiting and having BMs. Pt to have CT today for rule out of intra-abdominal abscess.  Pt was ordered Ensure supplements on 5/4 but this order was d/c once made NPO. Per previous nutrition encounters, pt reports she doesn't tolerate Ensure/Boost products. Pt did tolerate products like Muscle Milk and Carnation Instant Breakfast. Will consider these supplements once on a diet again.  Per weight records, pt's weight has been in 80-90 lb range since 2015. Pt has had difficulty gaining weight d/t food intolerances and lactose  intolerance. Pt's weight has fluctuated similarly this admission. The lowest weight measured was 83 lb on 5/3 and was 98 lb on 4/29.  Medications: KCl infusion, Zofran PRN Labs reviewed: Low K   NUTRITION - FOCUSED PHYSICAL EXAM:  Unable to perform per department requirements to work remotely.  Diet Order:   Diet Order            Diet NPO time specified Except for: Sips with Meds, Ice Chips  Diet effective now              EDUCATION NEEDS:   No education needs have been identified at this time  Skin:  Skin Assessment: Reviewed RN Assessment  Last BM:  5/5  Height:   Ht Readings from Last 1 Encounters:  02/12/2019 5\' 4"  (1.626 m)    Weight:   Wt Readings from Last 1 Encounters:  03/13/19 43.8 kg    Ideal Body Weight:  54.5 kg  BMI:  Body mass index is 16.57 kg/m.  Estimated Nutritional Needs:   Kcal:  1400-1600  Protein:  70-80g  Fluid:  1.6L/day  Tilda Franco, MS, RD, LDN [facility] Inpatient Clinical Dietitian Pager: [phone] After Hours Pager: [phone]

## 2019-03-13 NOTE — Progress Notes (Signed)
8 Days Post-Op  Subjective: CC: Nausea Patient reports that she has less abdominal pain this morning. More just soreness to the lower abdomen. Has started to pass flatus and had a BM yesterday that she reports as loose (nursing staff report as normal but with undigested pill in stool). Continues to have waves of nausea throughout the night but no emesis. Also reports she is hungry however. Spoke with husband yesterday ~330-4pm who felt the patient was becoming more appropriate and back to her baseline. Was confused yesterday night per nursing staff however. Now more appropriate. Still confused at time to day/night but orientated otherwise.   Objective: Vital signs in last 24 hours: Temp:  [98 F (36.7 C)-98.7 F (37.1 C)] 98.7 F (37.1 C) (05/06 0718) Pulse Rate:  [89-96] 96 (05/05 1812) Resp:  [16-24] 17 (05/06 0718) BP: (0-193)/(0-113) 157/90 (05/06 0718) SpO2:  [95 %-97 %] 96 % (05/05 1812) Weight:  [43.8 kg] 43.8 kg (05/06 0427) Last BM Date: 03/12/19  Intake/Output from previous day: 05/05 0701 - 05/06 0700 In: -  Out: 550 [Emesis/NG output:200; Stool:350] Intake/Output this shift: No intake/output data recorded.  PE: Gen: Awake and alert, NAD Heart: Mild tachycardia (~100) but regular rhythm  Lungs: CTAB, normal effort  Abd: Soft, generalized tenderness, greatest around midline wound and lower abdomen without peritonitis. Moderate distension. Normoactive to slightly hypoactive BS. Wound c/d with healthy granulation tissue. No erythema or drainage. See picture below.  Msk: no edmea     Lab Results:  Recent Labs    03/13/19 0452  WBC 15.8*  HGB 10.1*  HCT 29.7*  PLT 436*   BMET Recent Labs    03/12/19 0949 03/13/19 0452  NA 136 137  K 3.5 3.4*  CL 99 100  CO2 27 24  GLUCOSE 116* 86  BUN 14 14  CREATININE 0.53 0.63  CALCIUM 8.4* 8.6*   PT/INR No results for input(s): LABPROT, INR in the last 72 hours. CMP     Component Value Date/Time   NA 137  03/13/2019 0452   K 3.4 (L) 03/13/2019 0452   CL 100 03/13/2019 0452   CO2 24 03/13/2019 0452   GLUCOSE 86 03/13/2019 0452   BUN 14 03/13/2019 0452   CREATININE 0.63 03/13/2019 0452   CALCIUM 8.6 (L) 03/13/2019 0452   PROT 7.3 02/06/2019 2330   ALBUMIN 4.6 02/17/2019 2330   AST 34 02/12/2019 2330   ALT 31 02/11/2019 2330   ALKPHOS 75 02/20/2019 2330   BILITOT 0.7 02/14/2019 2330   GFRNONAA >60 03/13/2019 0452   GFRAA >60 03/13/2019 0452   Lipase     Component Value Date/Time   LIPASE 32 03/03/2019 2330       Studies/Results: Dg Abd Portable 1 View  Result Date: 03/12/2019 CLINICAL DATA:  Nausea and vomiting EXAM: PORTABLE ABDOMEN - 1 VIEW COMPARISON:  03/07/2019 abdominal CT FINDINGS: Dilated small bowel with probable gas filling of the transverse colon and descending colon as well. Bowel sutures seen in the right abdomen. No concerning mass effect or gas collection. Limited visualization of the lower lungs due to over penetration. Cholecystectomy clips. IMPRESSION: Dilated bowel likely affecting both colon and small bowel, compatible with postoperative ileus. Electronically Signed   By: Marnee Spring M.D.   On: 03/12/2019 08:34    Anti-infectives: Anti-infectives (From admission, onward)   Start     Dose/Rate Route Frequency Ordered Stop   03/02/2019 0515  clindamycin (CLEOCIN) IVPB 900 mg     900 mg  100 mL/hr over 30 Minutes Intravenous To Surgery 03-16-19 0425 03-16-19 0531   03-16-19 0515  gentamicin (GARAMYCIN) 210 mg in dextrose 5 % 100 mL IVPB     5 mg/kg  42.2 kg 105.3 mL/hr over 60 Minutes Intravenous STAT 03-16-19 0425 03-16-19 0604       Assessment/Plan Cervical dystonia ABLAnemia - hbgstable, 10.1 on 5/6 Delirium - Spoke with husband on the phone yesterday ~330-4pm who felt the patient was becoming more appropriate and back to her baseline. Was confused yesterday night per nursing staff however. Now more appropriate. Still confused at time to day/night  but orientated otherwise.  Minimize sedating/narcotic meds. Orient her to time of day. Keep blinds open during the day.  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 Still some nausea. Pain improved. WBC 15.8 this AM. Will obtain CT to r/o intra-abdominal abscess - Continue phenergan for refractory N/V. Simethicone for bloating.  - Continue scheduled Tylenol&tramadol. Morphine prn.  - Needs to mobilize.PT/OT. Recommended for HH - IS - Pathis benign with no evidence of colitis or malignant process  FEN - NPO except sips w/ meds, IVF, K 3.4 (replace) VTE -Lovenox, SCDs ID -Clinda/Gent periop Foley - DC 4/29 Follow up - Dana [name] POC - Husband Lanney GinsGeorge Reeb  Dispo: PT with N/V, abd pain and distension 2 days ago. Xray was c/w ileus. Now with bowel function and improved pain this morning but continued nausea. WBC up at 15.8. Will obtain CT to r/o intra-abdominal abscess. Some confusion overnight. Appropriate this AM. Will continue to monitor. Plan as above. Leave NPO till CT results. PT/OT recommended HH.     LOS: 8 days    Jacinto HalimMichael M Tonyetta Berko , [facility] 03/13/2019, 7:37 AM Pager: [phone]

## 2019-03-13 NOTE — Progress Notes (Signed)
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 Pt with delirium post op. PMH - anxiety, depression, cervical dystonia, IBS, cervical spondylosis, osteoporosis, lt wrist fx, rt rotator cuff repair   OT comments  Pt performing bed mobility with minguardA and transfers with minguardA. Pt performing transfer to commode for toilet use and toilet hygiene with fair balance and minguardA. Pt performing ADL functional mobility with minguardA with Rw. Pt standing for ADL tasks at commode and sink wx2 mins each with fair balance and minguardA. Pt would greatly benefit from continued OT skilled services for AD[facility], mobility [facility] OT to follow acutely.  BP: 178/93 s/p exertion.    Follow Up Recommendations  Home health OT;Supervision/Assistance - 24 hour    Equipment Recommendations  Tub/shower seat    Recommendations for Other Services      Precautions / Restrictions Precautions Precautions: Fall Restrictions Weight Bearing Restrictions: No       Mobility Bed Mobility Overal bed mobility: Needs Assistance Bed Mobility: Supine to Sit;Sit to Supine Rolling: Min guard   Supine to sit: Min guard     General bed mobility comments: use of bed rail, min guard for safety  Transfers Overall transfer level: Needs assistance Equipment used: Rolling walker (2 wheeled) Transfers: Sit to/from Stand Sit to Stand: Min guard         General transfer comment: min guard for safety with transition into standing from EOB    Balance Overall balance assessment: Needs assistance Sitting-balance support: No upper extremity supported;Feet supported Sitting balance-Leahy Scale: Fair     Standing balance support: No upper extremity supported;During functional activity Standing balance-Leahy Scale:  Fair                             ADL either performed or assessed with clinical judgement   ADL Overall ADL's : Needs assistance/impaired     Grooming: Wash/dry hands;Wash/dry face;Brushing hair;Min guard;Standing Grooming Details (indicate cue type and reason): Pt stood for ADL tasks                 Toilet Transfer: Min guard;Ambulation;Regular Toilet;Grab bars Toilet Transfer Details (indicate cue type and reason): seated without plopping Toileting- Clothing Manipulation and Hygiene: Min guard;Sit to/from stand Toileting - Clothing Manipulation Details (indicate cue type and reason): performing own ADL tasks     Functional mobility during ADLs: Min guard;Rolling walker General ADL Comments: Pt presenting with decreased activity tolerance due to pain and fatigue     Vision       Perception     Praxis      Cognition Arousal/Alertness: Awake/alert Behavior During Therapy: Anxious Overall Cognitive Status: Impaired/Different from baseline Area of Impairment: Memory;Safety/judgement;Problem solving                     Memory: Decreased short-term memory Following Commands: Follows one step commands consistently Safety/Judgement: Decreased awareness of deficits   Problem Solving: Requires verbal cues General Comments: Pt follwoing commands adn seems to be recovering well - cognitively not aware of time - thinking it is daytime when its night time, but pt appears to be less confused than previous sessions        Exercises     Shoulder Instructions       General  Comments Pt with elevated BP s/p exertion: 178/93 with no dizziness reported, pt feelig nauseous.    Pertinent Vitals/ Pain       Pain Assessment: Faces Faces Pain Scale: Hurts little more Pain Location: abdomen Pain Descriptors / Indicators: Grimacing;Guarding Pain Intervention(s): Monitored during session  Home Living                                           Prior Functioning/Environment              Frequency  Min 3X/week        Progress Toward Goals  OT Goals(current goals can now be found in the care plan section)  Progress towards OT goals: Progressing toward goals  Acute Rehab OT Goals Patient Stated Goal: go home OT Goal Formulation: With patient Time For Goal Achievement: 03/26/19 Potential to Achieve Goals: Good ADL Goals Pt Will Perform Grooming: with modified independence;standing Pt Will Perform Lower Body Dressing: with modified independence;sit to/from stand Pt Will Transfer to Toilet: with modified independence;ambulating;regular height toilet Pt Will Perform Toileting - Clothing Manipulation and hygiene: with modified independence;sit to/from stand Pt Will Perform Tub/Shower Transfer: Tub transfer;with min guard assist;ambulating;shower seat  Plan Discharge plan remains appropriate    Co-evaluation                 AM-PAC OT "6 Clicks" Daily Activity     Outcome Measure   Help from another person eating meals?: None Help from another person taking care of personal grooming?: A Little Help from another person toileting, which includes using toliet, bedpan, or urinal?: A Little Help from another person bathing (including washing, rinsing, drying)?: A Little Help from another person to put on and taking off regular upper body clothing?: A Little Help from another person to put on and taking off regular lower body clothing?: A Little 6 Click Score: 19    End of Session Equipment Utilized During Treatment: Gait belt;Rolling walker  OT Visit Diagnosis: Unsteadiness on feet (R26.81);Other abnormalities of gait and mobility (R26.89);Muscle weakness (generalized) (M62.81)   Activity Tolerance No increased pain;Patient tolerated treatment well   Patient Left in chair;with call bell/phone within reach;with chair alarm set   Nurse Communication Mobility status        Time: 1005-1025 OT Time  Calculation (min): 20 min  Charges: OT General Charges $OT Visit: 1 Visit OT Treatments $Self Care/Home Management : 8-22 mins  Dana [name]) Glendell Docker OTR/L Acute Rehabilitation Services Pager: [phone] Office: [phone]    Dana [name] 03/13/2019, 4:05 PM

## 2019-03-13 NOTE — Progress Notes (Signed)
Pt refuses to have another IV in place. Fluids stopped. Provider to be made aware.

## 2019-03-13 NOTE — Progress Notes (Signed)
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 Pt with delirium post op. PMH - anxiety, depression, cervical dystonia, IBS, cervical spondylosis, osteoporosis, lt wrist fx, rt rotator cuff repair    PT Comments    Pt remains limited overall with functional mobility secondary to fatigue, weakness and unrelenting dizziness. Pt's HR and SPO2 stable on RA throughout. Pt's BP elevated at end of session (172/95 mmHg) - RN notified. PT encouraged pt to be OOB in recliner chair later today after her test and ambulate with RN staff assistance throughout the day. Pt agreeable to plan. Pt would continue to benefit from skilled physical therapy services at this time while admitted and after d/c to address the below listed limitations in order to improve overall safety and independence with functional mobility.    Follow Up Recommendations  Home health PT;Supervision/Assistance - 24 hour     Equipment Recommendations  Other (comment)(rollator)    Recommendations for Other Services       Precautions / Restrictions Precautions Precautions: Fall Restrictions Weight Bearing Restrictions: No    Mobility  Bed Mobility Overal bed mobility: Needs Assistance Bed Mobility: Supine to Sit;Sit to Supine     Supine to sit: Min guard Sit to supine: Min guard   General bed mobility comments: use of bed rail, min guard for safety  Transfers Overall transfer level: Needs assistance Equipment used: 1 person hand held assist Transfers: Sit to/from Stand Sit to Stand: Min guard         General transfer comment: min guard for safety with transition into standing from EOB  Ambulation/Gait Ambulation/Gait assistance: Min guard Gait Distance (Feet): 30 Feet Assistive device: 1 person hand held assist Gait Pattern/deviations:  Narrow base of support;Trunk flexed;Step-to pattern;Decreased stride length Gait velocity: decr   General Gait Details: pt with reported dizziness throughout (unresolved with time or static positioning); pt ?self-limiting to in room ambulation only with min guard for safety, no overt LOB or need for physical assistance; pt requesting use of 1HHA versus using RW   Stairs             Wheelchair Mobility    Modified Rankin (Stroke Patients Only)       Balance Overall balance assessment: Needs assistance Sitting-balance support: No upper extremity supported;Feet supported Sitting balance-Leahy Scale: Fair     Standing balance support: No upper extremity supported;During functional activity Standing balance-Leahy Scale: Fair                              Cognition Arousal/Alertness: Awake/alert Behavior During Therapy: Anxious Overall Cognitive Status: Impaired/Different from baseline Area of Impairment: Memory;Safety/judgement;Problem solving                     Memory: Decreased short-term memory   Safety/Judgement: Decreased awareness of deficits;Decreased awareness of safety   Problem Solving: Requires verbal cues        Exercises      General Comments        Pertinent Vitals/Pain Pain Assessment: Faces Faces Pain Scale: Hurts little more Pain Location: abdomen Pain Descriptors / Indicators: Grimacing;Guarding Pain Intervention(s): Monitored during session;Repositioned    Home Living  Prior Function            PT Goals (current goals can now be found in the care plan section) Acute Rehab PT Goals PT Goal Formulation: With patient Time For Goal Achievement: 03/22/19 Potential to Achieve Goals: Good Progress towards PT goals: Progressing toward goals    Frequency    Min 3X/week      PT Plan Current plan remains appropriate    Co-evaluation              AM-PAC PT "6 Clicks" Mobility    Outcome Measure  Help needed turning from your back to your side while in a flat bed without using bedrails?: None Help needed moving from lying on your back to sitting on the side of a flat bed without using bedrails?: A Little Help needed moving to and from a bed to a chair (including a wheelchair)?: A Little Help needed standing up from a chair using your arms (e.g., wheelchair or bedside chair)?: A Little Help needed to walk in hospital room?: A Little Help needed climbing 3-5 steps with a railing? : A Little 6 Click Score: 19    End of Session   Activity Tolerance: Patient limited by fatigue Patient left: in bed;with call bell/phone within reach;with bed alarm set Nurse Communication: Mobility status;Other (comment)(elevated BP) PT Visit Diagnosis: Unsteadiness on feet (R26.81);Muscle weakness (generalized) (M62.81)     Time: 1540-0867 PT Time Calculation (min) (ACUTE ONLY): 18 min  Charges:  $Therapeutic Activity: 8-22 mins                     Deborah Chalk, PT, DPT  Acute Rehabilitation Services Pager [phone] Office [phone]     Dana [name] 03/13/2019, 10:51 AM

## 2019-03-14 LAB — CBC
HCT: 30.8 % — ABNORMAL LOW (ref 36.0–46.0)
Hemoglobin: 10.3 g/dL — ABNORMAL LOW (ref 12.0–15.0)
MCH: 31.5 pg (ref 26.0–34.0)
MCHC: 33.4 g/dL (ref 30.0–36.0)
MCV: 94.2 fL (ref 80.0–100.0)
Platelets: 585 10*3/uL — ABNORMAL HIGH (ref 150–400)
RBC: 3.27 MIL/uL — ABNORMAL LOW (ref 3.87–5.11)
RDW: 15.2 % (ref 11.5–15.5)
WBC: 12.2 10*3/uL — ABNORMAL HIGH (ref 4.0–10.5)
nRBC: 0 % (ref 0.0–0.2)

## 2019-03-14 LAB — BASIC METABOLIC PANEL
Anion gap: 13 (ref 5–15)
BUN: 12 mg/dL (ref 8–23)
CO2: 25 mmol/L (ref 22–32)
Calcium: 8.8 mg/dL — ABNORMAL LOW (ref 8.9–10.3)
Chloride: 96 mmol/L — ABNORMAL LOW (ref 98–111)
Creatinine, Ser: 0.65 mg/dL (ref 0.44–1.00)
GFR calc Af Amer: >60 mL/min (ref >60)
GFR calc non Af Amer: >60 mL/min (ref >60)
Glucose, Bld: 85 mg/dL (ref 70–99)
Potassium: 3.5 mmol/L (ref 3.5–5.1)
Sodium: 134 mmol/L — ABNORMAL LOW (ref 135–145)

## 2019-03-14 MED ORDER — KCL IN DEXTROSE-NACL 20-5-0.45 MEQ/L-%-% IV SOLN
INTRAVENOUS | Status: DC
  Administered 2019-03-14 – 2019-03-17 (×6): via INTRAVENOUS
  Filled 2019-03-14 (×6): qty 1000

## 2019-03-14 NOTE — Progress Notes (Signed)
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 Pt with delirium post op. PMH - anxiety, depression, cervical dystonia, IBS, cervical spondylosis, osteoporosis, lt wrist fx, rt rotator cuff repair    PT Comments    Pt making fair progress, remains limited secondary to fatigue and generalized weakness. Pt tolerated sitting in chair at sink for ADL/grooming tasks for ~20 minutes without UE support with intermittent back support. Pt only able to tolerate short distance ambulation following seated activity. Pt would continue to benefit from skilled physical therapy services at this time while admitted and after d/c to address the below listed limitations in order to improve overall safety and independence with functional mobility.    Follow Up Recommendations  Home health PT;Supervision/Assistance - 24 hour     Equipment Recommendations  Other (comment)(rollator)    Recommendations for Other Services       Precautions / Restrictions Precautions Precautions: Fall Restrictions Weight Bearing Restrictions: No    Mobility  Bed Mobility Overal bed mobility: Needs Assistance Bed Mobility: Sit to Supine       Sit to supine: Supervision      Transfers Overall transfer level: Needs assistance Equipment used: None Transfers: Sit to/from Stand Sit to Stand: Min guard         General transfer comment: min guard for safety with transition into standing from straight back chair at sink  Ambulation/Gait Ambulation/Gait assistance: Min guard Gait Distance (Feet): 20 Feet Assistive device: 1 person hand held assist Gait Pattern/deviations: Narrow base of support;Trunk flexed;Step-to pattern;Decreased stride length Gait velocity: decr   General Gait Details: pt with no reports of dizziness, limited secondary  to fatigue as pt had just finished a wash-up with nurse tech   Stairs             Wheelchair Mobility    Modified Rankin (Stroke Patients Only)       Balance Overall balance assessment: Needs assistance Sitting-balance support: No upper extremity supported;Feet supported Sitting balance-Leahy Scale: Good Sitting balance - Comments: pt seated in chair at sink with intermittent back support for ~20 minutes while reaching within and outside of her BoS for various objects and completed ADL/grooming tasks   Standing balance support: No upper extremity supported;During functional activity Standing balance-Leahy Scale: Fair Standing balance comment: static standing with supervision                            Cognition Arousal/Alertness: Awake/alert Behavior During Therapy: Anxious Overall Cognitive Status: Impaired/Different from baseline Area of Impairment: Memory;Safety/judgement;Problem solving                     Memory: Decreased short-term memory   Safety/Judgement: Decreased awareness of deficits   Problem Solving: Requires verbal cues        Exercises      General Comments        Pertinent Vitals/Pain Pain Assessment: Faces Faces Pain Scale: Hurts little more Pain Location: abdomen Pain Descriptors / Indicators: Guarding;Sore Pain Intervention(s): Monitored during session    Home Living                      Prior Function            PT Goals (current goals can now be found  in the care plan section) Acute Rehab PT Goals PT Goal Formulation: With patient Time For Goal Achievement: 03/22/19 Potential to Achieve Goals: Good Progress towards PT goals: Progressing toward goals    Frequency    Min 3X/week      PT Plan Current plan remains appropriate    Co-evaluation              AM-PAC PT "6 Clicks" Mobility   Outcome Measure  Help needed turning from your back to your side while in a flat bed without  using bedrails?: None Help needed moving from lying on your back to sitting on the side of a flat bed without using bedrails?: A Little Help needed moving to and from a bed to a chair (including a wheelchair)?: A Little Help needed standing up from a chair using your arms (e.g., wheelchair or bedside chair)?: A Little Help needed to walk in hospital room?: A Little Help needed climbing 3-5 steps with a railing? : A Little 6 Click Score: 19    End of Session   Activity Tolerance: Patient limited by fatigue Patient left: in bed;with call bell/phone within reach;with bed alarm set Nurse Communication: Mobility status PT Visit Diagnosis: Unsteadiness on feet (R26.81);Muscle weakness (generalized) (M62.81)     Time: 0912-0950 PT Time Calculation (min) (ACUTE ONLY): 38 min  Charges:  $Gait Training: 8-22 mins $Therapeutic Activity: 23-37 mins                     Deborah ChalkJennifer Sirenity Shew, [place]PT, DPT  Acute Rehabilitation Services Pager [phone] Office [phone]     Alessandra BevelsJennifer M Lachanda Buczek 03/14/2019, 11:22 AM

## 2019-03-14 NOTE — Progress Notes (Signed)
9 Days Post-Op  Subjective: CC: Nausea Patient reports that she had episodes of nausea throughout the night and into the morning. Nurse reports that when asked about this overnight however she denied any nausea and has not required any zofran since 1223 yesterday. Patient did have some emesis yesterday when trying to drink contrast for CT, but non since. She reports "soreness" in a saucer shape around her midline wound. She reports passing "a little" flatus. Also reports a BM yesterday, but last recorded was 5/5. She denies CP, SOB, cough or changes in urination (baseline incontinence). CT yesterday c/w ileus.   Objective: Vital signs in last 24 hours: Temp:  [98.9 F (37.2 C)-99 F (37.2 C)] 99 F (37.2 C) (05/06 1920) Pulse Rate:  [100-103] 103 (05/07 0740) Resp:  [14-22] 14 (05/07 0740) BP: (123-186)/(72-105) 123/72 (05/07 0320) SpO2:  [95 %-97 %] 96 % (05/07 0740) Last BM Date: 03/12/19  Intake/Output from previous day: 05/06 0701 - 05/07 0700 In: 485 [I.V.:85; IV Piggyback:400] Out: -  Intake/Output this shift: No intake/output data recorded.  PE: Gen: Awake and alert, NAD Heart: Mild tachycardia (~100) but regular rhythm  Lungs: CTAB, normal effort  Abd: Soft,improved generalized tenderness, greatestaround midline woundwithout peritonitis. Improved, mild distension. Normoactive to slightly hypoactive BS. Wound c/dwith healthy granulation tissue. No drainage. See picture below.  Msk: no edmea      Lab Results:  Recent Labs    03/13/19 0452 03/14/19 0232  WBC 15.8* 12.2*  HGB 10.1* 10.3*  HCT 29.7* 30.8*  PLT 436* 585*   BMET Recent Labs    03/13/19 0452 03/14/19 0232  NA 137 134*  K 3.4* 3.5  CL 100 96*  CO2 24 25  GLUCOSE 86 85  BUN 14 12  CREATININE 0.63 0.65  CALCIUM 8.6* 8.8*   PT/INR No results for input(s): LABPROT, INR in the last 72 hours. CMP     Component Value Date/Time   NA 134 (L) 03/14/2019 0232   K 3.5 03/14/2019 0232   CL 96 (L) 03/14/2019 0232   CO2 25 03/14/2019 0232   GLUCOSE 85 03/14/2019 0232   BUN 12 03/14/2019 0232   CREATININE 0.65 03/14/2019 0232   CALCIUM 8.8 (L) 03/14/2019 0232   PROT 7.3 02/23/2019 2330   ALBUMIN 4.6 02/06/2019 2330   AST 34 02/16/2019 2330   ALT 31 03/03/2019 2330   ALKPHOS 75 02/07/2019 2330   BILITOT 0.7 03/01/2019 2330   GFRNONAA >60 03/14/2019 0232   GFRAA >60 03/14/2019 0232   Lipase     Component Value Date/Time   LIPASE 32 02/08/2019 2330       Studies/Results: Ct Abdomen Pelvis W Contrast  Result Date: 03/13/2019 CLINICAL DATA:  Cecal volvulus post RIGHT hemicolectomy on 02/15/2019, leukocytosis, nausea, vomiting and abdominal pain beginning 2 days ago EXAM: CT ABDOMEN AND PELVIS WITH CONTRAST TECHNIQUE: Multidetector CT imaging of the abdomen and pelvis was performed using the standard protocol following bolus administration of intravenous contrast. Sagittal and coronal MPR images reconstructed from axial data set. CONTRAST:  OMNIPAQUE IOHEXOL 300 MG/ML SOLN IV. No oral contrast. COMPARISON:  03/07/2019 FINDINGS: Lower chest: Lung bases emphysematous but clear Hepatobiliary: Post cholecystectomy.  No focal hepatic abnormalities Pancreas: Normal appearance Spleen: Normal appearance Adrenals/Urinary Tract: No adrenal abnormalities. Symmetric nephrograms with chronic nephrographic defect at inferior pole of RIGHT kidney unchanged. No renal mass, hydronephrosis, hydroureter, or bladder abnormality. Stomach/Bowel: Small hiatal hernia. Stomach otherwise unremarkable. Dilated small bowel loops throughout the abdomen. In  the immediate postoperative setting, this likely reflects postoperative ileus. Ileocolic anastomosis in the RIGHT mid abdomen. No small bowel wall thickening. Significantly decreased free air since previous exam. Persistent ascites, little changed. Vascular/Lymphatic: Atherosclerotic calcifications aorta and iliac arteries without aneurysm. No  adenopathy Reproductive: Atrophic uterus.  No adnexal masses. Other: No hernia. No definite focal gas/fluid collection cyst suggest abscess. Musculoskeletal: Unremarkable IMPRESSION: Prior RIGHT hemicolectomy. Diffusely dilated small bowel loops throughout abdomen and pelvis, favoring ileus in the immediate postoperative period. No bowel wall thickening identified. Persistent free fluid and decreased free air since previous study. Electronically Signed   By: Ulyses SouthwardMark  Boles M.D.   On: 03/13/2019 15:30   Dg Chest Port 1 View  Result Date: 03/13/2019 CLINICAL DATA:  Leukocytosis. EXAM: PORTABLE CHEST 1 VIEW COMPARISON:  02/14/2019 FINDINGS: The lungs are mildly hyperexpanded. No large area of consolidation or pleural effusion. Aortic calcium is again noted. The cardiac silhouette is not significantly enlarged. There is some pleural-parenchymal scarring at the lung apices, left worse than right. Degenerative changes are noted of the glenohumeral joints bilaterally. There is nonspecific gaseous distention of the colon. IMPRESSION: No active disease. Electronically Signed   By: Katherine Mantlehristopher  Green M.D.   On: 03/13/2019 16:54    Anti-infectives: Anti-infectives (From admission, onward)   Start     Dose/Rate Route Frequency Ordered Stop   02/28/2019 0515  clindamycin (CLEOCIN) IVPB 900 mg     900 mg 100 mL/hr over 30 Minutes Intravenous To Surgery 03/06/2019 0425 02/14/2019 0531   02/25/2019 0515  gentamicin (GARAMYCIN) 210 mg in dextrose 5 % 100 mL IVPB     5 mg/kg  42.2 kg 105.3 mL/hr over 60 Minutes Intravenous STAT 02/22/2019 0425 02/07/2019 0604       Assessment/Plan Cervical dystonia ABLAnemia - hbgstable,10.3 on 5/7 Delirium -  Now more appropriate. Still confused at time to day/night but orientated otherwise.  Minimize sedating/narcotic meds. Orient her to time of day. Keep blinds open during the day.  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 No intra-abdominal abscess. Still some nausea. Pain improved. WBC down from 15.8  this AM. CXR clear yesterday. UA for leukocytosis pending.  - If develops worsening N/V, bloating, can place NG tube  - Continue phenergan for refractory N/V. Simethiconefor bloating.  -Continue scheduled Tylenol&tramadol. Morphine prn.  - Needs to mobilize.PT/OT. Recommended for HH - IS - Pathis benign with no evidence of colitis or malignant process  FEN -NPOexcept sips w/ meds,IVF (D5 1/2 NS w/ K), K 3.5  VTE -Lovenox, SCDs ID -Clinda/Gent periop Foley - DC 4/29 Follow up - [facility] POC - Husband Lanney GinsGeorge Espiritu  Dispo:CT without intra-abdominal abscess. Leukocytosis resolving. CXR clear. UA pending collection (for leukocytosis). CT c/w ileus. No indication for NG tube at this time. ? Slow return of bowel function but will leave NPO for now given patients nausea. IVF changed to D5 1/2 NS w/ K. If continues to remain NPO, will consider TPN later this week. Will check pre-albumin. PT/OT recommended HH.    LOS: 9 days    Jacinto HalimMichael M Haruye Lainez , [facility] 03/14/2019, 7:55 AM Pager: [phone]

## 2019-03-15 LAB — CBC
HCT: 30.6 % — ABNORMAL LOW (ref 36.0–46.0)
Hemoglobin: 10.5 g/dL — ABNORMAL LOW (ref 12.0–15.0)
MCH: 32.5 pg (ref 26.0–34.0)
MCHC: 34.3 g/dL (ref 30.0–36.0)
MCV: 94.7 fL (ref 80.0–100.0)
Platelets: 540 10*3/uL — ABNORMAL HIGH (ref 150–400)
RBC: 3.23 MIL/uL — ABNORMAL LOW (ref 3.87–5.11)
RDW: 15.1 % (ref 11.5–15.5)
WBC: 11 10*3/uL — ABNORMAL HIGH (ref 4.0–10.5)
nRBC: 0 % (ref 0.0–0.2)

## 2019-03-15 LAB — URINALYSIS, ROUTINE W REFLEX MICROSCOPIC
Bacteria, UA: NONE SEEN
Bilirubin Urine: NEGATIVE
Glucose, UA: NEGATIVE mg/dL
Ketones, ur: NEGATIVE mg/dL
Leukocytes,Ua: NEGATIVE
Nitrite: NEGATIVE
Protein, ur: NEGATIVE mg/dL
Specific Gravity, Urine: 1.013 (ref 1.005–1.030)
pH: 7 (ref 5.0–8.0)

## 2019-03-15 LAB — PREALBUMIN: Prealbumin: 9.3 mg/dL — ABNORMAL LOW (ref 18–38)

## 2019-03-15 LAB — BASIC METABOLIC PANEL
Anion gap: 6 (ref 5–15)
BUN: 8 mg/dL (ref 8–23)
CO2: 30 mmol/L (ref 22–32)
Calcium: 8.5 mg/dL — ABNORMAL LOW (ref 8.9–10.3)
Chloride: 97 mmol/L — ABNORMAL LOW (ref 98–111)
Creatinine, Ser: 0.44 mg/dL (ref 0.44–1.00)
GFR calc Af Amer: >60 mL/min (ref >60)
GFR calc non Af Amer: >60 mL/min (ref >60)
Glucose, Bld: 126 mg/dL — ABNORMAL HIGH (ref 70–99)
Potassium: 3.3 mmol/L — ABNORMAL LOW (ref 3.5–5.1)
Sodium: 133 mmol/L — ABNORMAL LOW (ref 135–145)

## 2019-03-15 LAB — MAGNESIUM: Magnesium: 1.5 mg/dL — ABNORMAL LOW (ref 1.7–2.4)

## 2019-03-15 MED ORDER — MAGNESIUM SULFATE 2 GM/50ML IV SOLN
2.0000 g | Freq: Once | INTRAVENOUS | Status: AC
  Administered 2019-03-15: 2 g via INTRAVENOUS
  Filled 2019-03-15: qty 50

## 2019-03-15 MED ORDER — POTASSIUM CHLORIDE CRYS ER 20 MEQ PO TBCR
30.0000 meq | EXTENDED_RELEASE_TABLET | Freq: Two times a day (BID) | ORAL | Status: AC
  Administered 2019-03-15 (×2): 30 meq via ORAL
  Filled 2019-03-15 (×2): qty 1

## 2019-03-15 MED ORDER — POLYETHYLENE GLYCOL 3350 17 G PO PACK: 17.0000 g | PACK | Freq: Every day | ORAL | Status: DC

## 2019-03-15 MED ORDER — METOPROLOL TARTRATE 12.5 MG HALF TABLET
12.5000 mg | ORAL_TABLET | Freq: Two times a day (BID) | ORAL | Status: DC
  Administered 2019-03-15 – 2019-03-16 (×4): 12.5 mg via ORAL
  Filled 2019-03-15 (×4): qty 1

## 2019-03-15 NOTE — Care Management Important Message (Signed)
Important Message  Patient Details  Name: Dana [name] MRN: [id number] Date of Birth: 09-18-1946   Medicare Important Message Given:  Yes    Leone Haven, RN 03/15/2019, 3:05 PM

## 2019-03-15 NOTE — Progress Notes (Addendum)
10 Days Post-Op  Subjective: CC: Nausea, hungry Patient reports that she did not get tray yesterday for dinner. Nursing staff reports she did and only ate a bite of jello and drank a small amount of ginger ale. She is very concerned about not eating and wants to go home. Blinds opened and patient orientated. Became much more appropriate. She has agreed to stay. She reports her abdominal pain is improved. Passing flatus. Some bloating. She is unsure of BM. Reports she has waves of nausea but is hungry.   Objective: Vital signs in last 24 hours: Temp:  [98.3 F (36.8 C)-98.9 F (37.2 C)] 98.4 F (36.9 C) (05/08 0732) Pulse Rate:  [49-113] 113 (05/08 0732) Resp:  [18-23] 23 (05/08 0732) BP: (103-191)/(71-113) 139/71 (05/08 0732) SpO2:  [93 %-97 %] 93 % (05/08 0732) Weight:  [45.4 kg] 45.4 kg (05/08 0311) Last BM Date: 03/12/19  Intake/Output from previous day: 05/07 0701 - 05/08 0700 In: 1292.4 [I.V.:1292.4] Out: -  Intake/Output this shift: No intake/output data recorded.  PE: Gen: Awake and alert, NAD Heart:Mild tachycardia (~110) but regular rhythm. Telemetry reviewed  Lungs: CTAB, normal effort  Abd: Soft,improved generalized tenderness, greatestaround midlinewoundwithout peritonitis. Improved, but still mild distension. Normoactive BS. Woundc/dwith healthy granulation tissue at base. No drainage. There is a small area of separation at the top of wound without signs of dehiscence. No false bottom or tunneling. Some serosanguinous drainage and reactivde redness right at skin edge. No purulence. See picture below. Msk: no edmea       Lab Results:  Recent Labs    03/14/19 0232 03/15/19 0401  WBC 12.2* 11.0*  HGB 10.3* 10.5*  HCT 30.8* 30.6*  PLT 585* 540*   BMET Recent Labs    03/14/19 0232 03/15/19 0401  NA 134* 133*  K 3.5 3.3*  CL 96* 97*  CO2 25 30  GLUCOSE 85 126*  BUN 12 8  CREATININE 0.65 0.44  CALCIUM 8.8* 8.5*   PT/INR No results for  input(s): LABPROT, INR in the last 72 hours. CMP     Component Value Date/Time   NA 133 (L) 03/15/2019 0401   K 3.3 (L) 03/15/2019 0401   CL 97 (L) 03/15/2019 0401   CO2 30 03/15/2019 0401   GLUCOSE 126 (H) 03/15/2019 0401   BUN 8 03/15/2019 0401   CREATININE 0.44 03/15/2019 0401   CALCIUM 8.5 (L) 03/15/2019 0401   PROT 7.3 02/15/2019 2330   ALBUMIN 4.6 03/03/2019 2330   AST 34 02/24/2019 2330   ALT 31 02/23/2019 2330   ALKPHOS 75 03/06/2019 2330   BILITOT 0.7 02/09/2019 2330   GFRNONAA >60 03/15/2019 0401   GFRAA >60 03/15/2019 0401   Lipase     Component Value Date/Time   LIPASE 32 02/22/2019 2330       Studies/Results: Ct Abdomen Pelvis W Contrast  Result Date: 03/13/2019 CLINICAL DATA:  Cecal volvulus post RIGHT hemicolectomy on 02/11/2019, leukocytosis, nausea, vomiting and abdominal pain beginning 2 days ago EXAM: CT ABDOMEN AND PELVIS WITH CONTRAST TECHNIQUE: Multidetector CT imaging of the abdomen and pelvis was performed using the standard protocol following bolus administration of intravenous contrast. Sagittal and coronal MPR images reconstructed from axial data set. CONTRAST:  OMNIPAQUE IOHEXOL 300 MG/ML SOLN IV. No oral contrast. COMPARISON:  03/07/2019 FINDINGS: Lower chest: Lung bases emphysematous but clear Hepatobiliary: Post cholecystectomy.  No focal hepatic abnormalities Pancreas: Normal appearance Spleen: Normal appearance Adrenals/Urinary Tract: No adrenal abnormalities. Symmetric nephrograms with chronic nephrographic defect  at inferior pole of RIGHT kidney unchanged. No renal mass, hydronephrosis, hydroureter, or bladder abnormality. Stomach/Bowel: Small hiatal hernia. Stomach otherwise unremarkable. Dilated small bowel loops throughout the abdomen. In the immediate postoperative setting, this likely reflects postoperative ileus. Ileocolic anastomosis in the RIGHT mid abdomen. No small bowel wall thickening. Significantly decreased free air since previous  exam. Persistent ascites, little changed. Vascular/Lymphatic: Atherosclerotic calcifications aorta and iliac arteries without aneurysm. No adenopathy Reproductive: Atrophic uterus.  No adnexal masses. Other: No hernia. No definite focal gas/fluid collection cyst suggest abscess. Musculoskeletal: Unremarkable IMPRESSION: Prior RIGHT hemicolectomy. Diffusely dilated small bowel loops throughout abdomen and pelvis, favoring ileus in the immediate postoperative period. No bowel wall thickening identified. Persistent free fluid and decreased free air since previous study. Electronically Signed   By: Ulyses Southward M.D.   On: 03/13/2019 15:30   Dg Chest Port 1 View  Result Date: 03/13/2019 CLINICAL DATA:  Leukocytosis. EXAM: PORTABLE CHEST 1 VIEW COMPARISON:  03/06/2019 FINDINGS: The lungs are mildly hyperexpanded. No large area of consolidation or pleural effusion. Aortic calcium is again noted. The cardiac silhouette is not significantly enlarged. There is some pleural-parenchymal scarring at the lung apices, left worse than right. Degenerative changes are noted of the glenohumeral joints bilaterally. There is nonspecific gaseous distention of the colon. IMPRESSION: No active disease. Electronically Signed   By: Katherine Mantle M.D.   On: 03/13/2019 16:54    Anti-infectives: Anti-infectives (From admission, onward)   Start     Dose/Rate Route Frequency Ordered Stop   02/17/2019 0515  clindamycin (CLEOCIN) IVPB 900 mg     900 mg 100 mL/hr over 30 Minutes Intravenous To Surgery 02/06/2019 0425 02/28/2019 0531   02/22/2019 0515  gentamicin (GARAMYCIN) 210 mg in dextrose 5 % 100 mL IVPB     5 mg/kg  42.2 kg 105.3 mL/hr over 60 Minutes Intravenous STAT 02/13/2019 0425 03/01/2019 0604       Assessment/Plan Cervical dystonia ABLAnemia - hbgstable,10.5on 5/8 Delirium - Now more appropriate. Still confused at time to day/night but orientated otherwise.Nursing staff reports she was confused overnight and does not  remember getting food tray. Patient very concerned about home medications. I went through her list of home medications and reassured her they were all ordered as she takes at home. Minimize sedating/narcotic meds. Orient her to time of day. Keep blinds open during the day. Protein Calorie Malnutrition - Pre-albumin 9.3. Reports she normally takes muscle milk and carnations instant breakfast at home. Will try these when able. Does not tolerate boost or ensure. Nutrition following.  Elevated Blood Pressure - 103/90-191/113 overnight. Has been elevated on/off for few several days. No history of HTN. No home medications. Also with tachycardia as below. Will start low dose metoprolol.  Tachycardia - Telemetry with sinus tachycardia. No hypotension or hypoxia. No CP, SOB.    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 No intra-abdominal abscess. Still some nausea. Pain improved. WBC down from 11 this AM. CXR clear yesterday. UA for leukocytosis pending.  - Started on CLD yesterday afternoon. Very little intake yesterday, about to eat breakfast. Will recheck in afternoon to see if can be advanced to FLD -Continuephenergan for refractory N/V. Simethiconefor bloating.  -Continue scheduled Tylenol&tramadol. Morphine prn. - Needs to mobilize.PT/OT. Recommended for HH - IS - Pathis benign with no evidence of colitis or malignant process  FEN -CLD,IVF (D5 1/2 NS w/ K),K 3.3 (replace, Mg pending), bowel regimen (Colace) VTE -Lovenox,  SCDs ID -Clinda/Gent periop, WBC down at 11 this AM Foley - DC 4/29 Follow up - [facility] POC - Husband Lanney GinsGeorge Aiello  Dispo:Patient started on CLD yesterday evening. Nursing staff reports she became confused overnight and doesn't remember having tray. Report she had a bite of jello and little bit of ginger ale. Will see how she does with breakfast this AM before advancing diet, given her reports of  nausea this AM (nursing staff to give Zofran). Optimize electrolytes. She has been reportedly having several episodes of incontinence, more than what is normal for her. Nursing staff will get UA today. Continue working with PT/OT who recommend HH. Attempted to call the patients husband this AM to give update. I will try again after I see her in the afternoon.     LOS: 10 days    Jacinto HalimMichael M Tytus Strahle , [facility] 03/15/2019, 8:07 AM Pager: [phone]

## 2019-03-15 NOTE — Progress Notes (Signed)
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 Pt with delirium post op. PMH - anxiety, depression, cervical dystonia, IBS, cervical spondylosis, osteoporosis, lt wrist fx, rt rotator cuff repair   OT comments  Pt performing ADL functional mobility with minguardA in room and hallway for 50'. Pt performing transfer to commode with minguardA, transfers with minguardA and UB ADL with set-upA; LB ADL minA. Pt stood at sink x3 mins for grooming at sink. Pt would greatly benefit from continued OT skilled services for ADL,mobility and safety inHHOT setting. OT to follow acutely.  BP: 153/91 HR: 107 >94% O2    Follow Up Recommendations  Home health OT;Supervision/Assistance - 24 hour    Equipment Recommendations  Tub/shower seat    Recommendations for Other Services      Precautions / Restrictions Precautions Precautions: Fall Restrictions Weight Bearing Restrictions: No       Mobility Bed Mobility Overal bed mobility: Needs Assistance Bed Mobility: Sit to Supine       Sit to supine: Supervision      Transfers Overall transfer level: Needs assistance Equipment used: None Transfers: Sit to/from Stand Sit to Stand: Min guard         General transfer comment: min guard for safety with transition into standing from straight back chair at sink    Balance Overall balance assessment: Needs assistance Sitting-balance support: No upper extremity supported;Feet supported Sitting balance-Leahy Scale: Good Sitting balance - Comments: pt seated in chair at sink with intermittent back support for ~20 minutes while reaching within and outside of her BoS for various objects and completed ADL/grooming tasks   Standing balance support: No upper extremity supported;During functional activity Standing  balance-Leahy Scale: Fair Standing balance comment: static standing with supervision                           ADL either performed or assessed with clinical judgement   ADL Overall ADL's : Needs assistance/impaired Eating/Feeding: Set up;Sitting                       Toilet Transfer: Min guard;Comfort height toilet;Grab bars   Toileting- Clothing Manipulation and Hygiene: Minimal assistance;Sitting/lateral lean;Sit to/from stand;Cueing for safety       Functional mobility during ADLs: Min guard;Rolling walker General ADL Comments: Pt presenting with decreased activity tolerance due to pain and fatigue     Vision   Vision Assessment?: No apparent visual deficits   Perception     Praxis      Cognition Arousal/Alertness: Awake/alert Behavior During Therapy: Anxious Overall Cognitive Status: Impaired/Different from baseline Area of Impairment: Memory;Safety/judgement;Problem solving                     Memory: Decreased short-term memory   Safety/Judgement: Decreased awareness of deficits   Problem Solving: Requires verbal cues General Comments: following commands- never knowing what time of day it is        Exercises     Shoulder Instructions       General Comments O2 >90% on RA; BP 153/91    Pertinent Vitals/ Pain       Pain Assessment: Faces Faces Pain Scale: Hurts little more Pain Location: abdomen Pain Descriptors / Indicators: Guarding;Sore Pain Intervention(s): Monitored during session  Home Living  Prior Functioning/Environment              Frequency  Min 3X/week        Progress Toward Goals  OT Goals(current goals can now be found in the care plan section)  Progress towards OT goals: Progressing toward goals  Acute Rehab OT Goals Patient Stated Goal: go home OT Goal Formulation: With patient Time For Goal Achievement: 03/26/19 Potential to  Achieve Goals: Good ADL Goals Pt Will Perform Grooming: with modified independence;standing Pt Will Perform Lower Body Dressing: with modified independence;sit to/from stand Pt Will Transfer to Toilet: with modified independence;ambulating;regular height toilet Pt Will Perform Toileting - Clothing Manipulation and hygiene: with modified independence;sit to/from stand Pt Will Perform Tub/Shower Transfer: Tub transfer;with min guard assist;ambulating;shower seat  Plan Discharge plan remains appropriate    Co-evaluation                 AM-PAC OT "6 Clicks" Daily Activity     Outcome Measure   Help from another person eating meals?: None Help from another person taking care of personal grooming?: A Little Help from another person toileting, which includes using toliet, bedpan, or urinal?: A Little Help from another person bathing (including washing, rinsing, drying)?: A Little Help from another person to put on and taking off regular upper body clothing?: A Little Help from another person to put on and taking off regular lower body clothing?: A Little 6 Click Score: 19    End of Session Equipment Utilized During Treatment: Gait belt;Rolling walker  OT Visit Diagnosis: Unsteadiness on feet (R26.81);Other abnormalities of gait and mobility (R26.89);Muscle weakness (generalized) (M62.81)   Activity Tolerance No increased pain;Patient tolerated treatment well   Patient Left in chair;with call bell/phone within reach   Nurse Communication Mobility status        Time: 6168-3729 OT Time Calculation (min): 31 min  Charges: OT General Charges $OT Visit: 1 Visit OT Treatments $Self Care/Home Management : 23-37 mins  Revonda Standard Cecil Cranker) Glendell Docker OTR/L Acute Rehabilitation Services Pager: [phone] Office: [phone]    Lonzo Cloud 03/15/2019, 9:52 AM

## 2019-03-16 NOTE — Progress Notes (Signed)
[facility] Progress Note  11 Days Post-Op  Subjective: CC-  States that she still has some intermittent abdominal pain and bloating, but overall feeling better. Denies n/v. Passing flatus and she had BM x3 yesterday. Appetite suppressed. States that she hopes to go home soon.  Per RN she has been ambulating this morning. No emesis. Unsure how much she ate yesterday, but she has not eaten anything today.  Objective: Vital signs in last 24 hours: Temp:  [98.1 F (36.7 C)-99.1 F (37.3 C)] 98.2 F (36.8 C) (05/09 0700) Pulse Rate:  [82-109] 85 (05/09 0700) Resp:  [20-27] 21 (05/09 0700) BP: (113-187)/(78-100) 165/88 (05/09 0700) SpO2:  [94 %-97 %] 97 % (05/09 0700) Weight:  [42.2 kg] 42.2 kg (05/09 0500) Last BM Date: 03/12/19  Intake/Output from previous day: 05/08 0701 - 05/09 0700 In: 1603 [P.O.:120; I.V.:1483] Out: 1225 [Urine:1225] Intake/Output this shift: No intake/output data recorded.  PE: Gen:  Alert, NAD, pleasant HEENT: EOM's intact, pupils equal and round Pulm:  CTAB, no W/R/R, effort normal Abd: Soft, distended, mild diffuse tenderness without rebound or guarding, open midline incision cdi with proximal aspect of wound closing and distal portion with beefy red tissue and no erythema or drainage Skin: warm and dry  Lab Results:  Recent Labs    03/14/19 0232 03/15/19 0401  WBC 12.2* 11.0*  HGB 10.3* 10.5*  HCT 30.8* 30.6*  PLT 585* 540*   BMET Recent Labs    03/14/19 0232 03/15/19 0401  NA 134* 133*  K 3.5 3.3*  CL 96* 97*  CO2 25 30  GLUCOSE 85 126*  BUN 12 8  CREATININE 0.65 0.44  CALCIUM 8.8* 8.5*   PT/INR No results for input(s): LABPROT, INR in the last 72 hours. CMP     Component Value Date/Time   NA 133 (L) 03/15/2019 0401   K 3.3 (L) 03/15/2019 0401   CL 97 (L) 03/15/2019 0401   CO2 30 03/15/2019 0401   GLUCOSE 126 (H) 03/15/2019 0401   BUN 8 03/15/2019 0401   CREATININE 0.44 03/15/2019 0401   CALCIUM 8.5 (L)  03/15/2019 0401   PROT 7.3 02/13/19 2330   ALBUMIN 4.6 02/13/19 2330   AST 34 02/13/19 2330   ALT 31 02/13/19 2330   ALKPHOS 75 02/13/19 2330   BILITOT 0.7 02/13/19 2330   GFRNONAA >60 03/15/2019 0401   GFRAA >60 03/15/2019 0401   Lipase     Component Value Date/Time   LIPASE 32 02/13/19 2330       Studies/Results: No results found.  Anti-infectives: Anti-infectives (From admission, onward)   Start     Dose/Rate Route Frequency Ordered Stop   02/16/2019 0515  clindamycin (CLEOCIN) IVPB 900 mg     900 mg 100 mL/hr over 30 Minutes Intravenous To Surgery 02/14/2019 0425 02/16/2019 0531   02/25/2019 0515  gentamicin (GARAMYCIN) 210 mg in dextrose 5 % 100 mL IVPB     5 mg/kg  42.2 kg 105.3 mL/hr over 60 Minutes Intravenous STAT 03/07/2019 0425 02/11/2019 0604       Assessment/Plan Cervical dystonia ABLAnemia - hbgstable,10.5on 5/8 Delirium -continues to have intermittent confusion. Minimize sedating/narcotic meds. Orient her to time of day. Keep blinds open during the day. Protein Calorie Malnutrition - Pre-albumin 9.3 (5/8). Reports she normally takes muscle milk and carnations instant breakfast at home. Will try these when able. Does not tolerate boost or ensure. Nutrition following.  Elevated Blood Pressure - intermittently elevated, stable this AM. No history of HTN.  No home medications. started low dose metoprolol 5/8 Tachycardia - Telemetry with sinus tachycardia, HR 82-113. No hypotension or hypoxia. No CP, SOB. 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 - tolerating fulls but taking in very little - some persistent nausea, no emesis - having bowel function - Pathis benign with no evidence of colitis or malignant process  FEN -FLD,IVF (D5 1/2 NS w/ K) VTE -Lovenox, SCDs ID -Clinda/Gent periop, afebrile. ua neg Foley - DC  4/29 Follow up - Dwain Sarna POC - Husband Kamla Barris  Plan: Continue full liquids and encourage PO intake. Mobilize. Continue therapies. Repeat labs in AM.   LOS: 11 days    Franne Forts , [facility] 03/16/2019, 9:25 AM Pager: [phone] Mon-Thurs 7:00 am-4:30 pm Fri 7:00 am -11:30 AM Sat-Sun 7:00 am-11:30 am

## 2019-03-17 ENCOUNTER — Inpatient Hospital Stay (HOSPITAL_COMMUNITY): Payer: Medicare Other

## 2019-03-17 ENCOUNTER — Encounter (HOSPITAL_COMMUNITY): Payer: Self-pay | Admitting: Radiology

## 2019-03-17 ENCOUNTER — Inpatient Hospital Stay: Payer: Self-pay

## 2019-03-17 LAB — BASIC METABOLIC PANEL
Anion gap: 13 (ref 5–15)
BUN: 12 mg/dL (ref 8–23)
CO2: 30 mmol/L (ref 22–32)
Calcium: 8.7 mg/dL — ABNORMAL LOW (ref 8.9–10.3)
Chloride: 85 mmol/L — ABNORMAL LOW (ref 98–111)
Creatinine, Ser: 0.47 mg/dL (ref 0.44–1.00)
GFR calc Af Amer: >60 mL/min (ref >60)
GFR calc non Af Amer: >60 mL/min (ref >60)
Glucose, Bld: 142 mg/dL — ABNORMAL HIGH (ref 70–99)
Potassium: 3.7 mmol/L (ref 3.5–5.1)
Sodium: 128 mmol/L — ABNORMAL LOW (ref 135–145)

## 2019-03-17 LAB — CBC
HCT: 33.8 % — ABNORMAL LOW (ref 36.0–46.0)
Hemoglobin: 11.5 g/dL — ABNORMAL LOW (ref 12.0–15.0)
MCH: 32 pg (ref 26.0–34.0)
MCHC: 34 g/dL (ref 30.0–36.0)
MCV: 94.2 fL (ref 80.0–100.0)
Platelets: 663 10*3/uL — ABNORMAL HIGH (ref 150–400)
RBC: 3.59 MIL/uL — ABNORMAL LOW (ref 3.87–5.11)
RDW: 15 % (ref 11.5–15.5)
WBC: 30.6 10*3/uL — ABNORMAL HIGH (ref 4.0–10.5)
nRBC: 0 % (ref 0.0–0.2)

## 2019-03-17 LAB — URINALYSIS, ROUTINE W REFLEX MICROSCOPIC
Bacteria, UA: NONE SEEN
Bilirubin Urine: NEGATIVE
Glucose, UA: NEGATIVE mg/dL
Ketones, ur: NEGATIVE mg/dL
Leukocytes,Ua: NEGATIVE
Nitrite: NEGATIVE
Protein, ur: NEGATIVE mg/dL
Specific Gravity, Urine: 1.041 — ABNORMAL HIGH (ref 1.005–1.030)
pH: 6 (ref 5.0–8.0)

## 2019-03-17 LAB — BLOOD GAS, ARTERIAL
Acid-Base Excess: 5.8 mmol/L — ABNORMAL HIGH (ref 0.0–2.0)
Bicarbonate: 28.7 mmol/L — ABNORMAL HIGH (ref 20.0–28.0)
Drawn by: 545251
O2 Content: 3 L/min
O2 Saturation: 96.9 %
Patient temperature: 100.7
RATE: 32 resp/min
pCO2 arterial: 35.6 mmHg (ref 32.0–48.0)
pH, Arterial: 7.522 — ABNORMAL HIGH (ref 7.350–7.450)
pO2, Arterial: 82.7 mmHg — ABNORMAL LOW (ref 83.0–108.0)

## 2019-03-17 LAB — MAGNESIUM: Magnesium: 1.5 mg/dL — ABNORMAL LOW (ref 1.7–2.4)

## 2019-03-17 LAB — GLUCOSE, CAPILLARY
Glucose-Capillary: 100 mg/dL — ABNORMAL HIGH (ref 70–99)
Glucose-Capillary: 166 mg/dL — ABNORMAL HIGH (ref 70–99)

## 2019-03-17 LAB — PHOSPHORUS: Phosphorus: 2.8 mg/dL (ref 2.5–4.6)

## 2019-03-17 MED ORDER — MAGNESIUM SULFATE IN D5W 1-5 GM/100ML-% IV SOLN
1.0000 g | Freq: Once | INTRAVENOUS | Status: DC
  Filled 2019-03-17: qty 100

## 2019-03-17 MED ORDER — VANCOMYCIN HCL IN DEXTROSE 1-5 GM/200ML-% IV SOLN
1000.0000 mg | INTRAVENOUS | Status: DC
  Administered 2019-03-17 – 2019-03-19 (×2): 1000 mg via INTRAVENOUS
  Filled 2019-03-17 (×2): qty 200

## 2019-03-17 MED ORDER — ACETAMINOPHEN 650 MG RE SUPP
650.0000 mg | Freq: Four times a day (QID) | RECTAL | Status: DC | PRN
  Administered 2019-03-17: 650 mg via RECTAL
  Filled 2019-03-17: qty 1

## 2019-03-17 MED ORDER — POTASSIUM CHLORIDE 2 MEQ/ML IV SOLN
INTRAVENOUS | Status: DC
  Filled 2019-03-17 (×2): qty 1000

## 2019-03-17 MED ORDER — SODIUM CHLORIDE 0.9 % IV SOLN
1.0000 g | Freq: Two times a day (BID) | INTRAVENOUS | Status: DC
  Administered 2019-03-18 – 2019-03-20 (×5): 1 g via INTRAVENOUS
  Filled 2019-03-17 (×8): qty 1

## 2019-03-17 MED ORDER — TRAVASOL 10 % IV SOLN: INTRAVENOUS | Status: DC

## 2019-03-17 MED ORDER — TRAVASOL 10 % IV SOLN
INTRAVENOUS | Status: AC
  Administered 2019-03-17: 19:00:00 via INTRAVENOUS
  Filled 2019-03-17: qty 399.6

## 2019-03-17 MED ORDER — SODIUM PHOSPHATES 45 MMOLE/15ML IV SOLN
10.0000 mmol | Freq: Once | INTRAVENOUS | Status: AC
  Administered 2019-03-17: 10 mmol via INTRAVENOUS
  Filled 2019-03-17: qty 3.33

## 2019-03-17 MED ORDER — METOPROLOL TARTRATE 5 MG/5ML IV SOLN
2.5000 mg | Freq: Three times a day (TID) | INTRAVENOUS | Status: DC
  Administered 2019-03-17 – 2019-03-21 (×12): 2.5 mg via INTRAVENOUS
  Filled 2019-03-17 (×13): qty 5

## 2019-03-17 MED ORDER — POTASSIUM CHLORIDE IN NACL 20-0.9 MEQ/L-% IV SOLN
INTRAVENOUS | Status: DC
  Filled 2019-03-17: qty 1000

## 2019-03-17 MED ORDER — SODIUM CHLORIDE 0.9% FLUSH
10.0000 mL | Freq: Two times a day (BID) | INTRAVENOUS | Status: DC
  Administered 2019-03-18: 30 mL
  Administered 2019-03-19 – 2019-03-20 (×3): 10 mL

## 2019-03-17 MED ORDER — SODIUM CHLORIDE 0.9 % IV SOLN
1.0000 g | Freq: Once | INTRAVENOUS | Status: AC
  Administered 2019-03-17: 1 g via INTRAVENOUS
  Filled 2019-03-17: qty 1

## 2019-03-17 MED ORDER — INSULIN ASPART 100 UNIT/ML ~~LOC~~ SOLN
0.0000 [IU] | Freq: Four times a day (QID) | SUBCUTANEOUS | Status: DC
  Administered 2019-03-17: 2 [IU] via SUBCUTANEOUS
  Administered 2019-03-18 – 2019-03-19 (×2): 1 [IU] via SUBCUTANEOUS
  Administered 2019-03-19: 2 [IU] via SUBCUTANEOUS
  Administered 2019-03-19 – 2019-03-21 (×6): 1 [IU] via SUBCUTANEOUS
  Administered 2019-03-21: 06:00:00 2 [IU] via SUBCUTANEOUS

## 2019-03-17 MED ORDER — KCL IN DEXTROSE-NACL 20-5-0.9 MEQ/L-%-% IV SOLN
INTRAVENOUS | Status: DC
  Administered 2019-03-17: 14:00:00 via INTRAVENOUS
  Filled 2019-03-17: qty 1000

## 2019-03-17 MED ORDER — MAGNESIUM SULFATE 2 GM/50ML IV SOLN
2.0000 g | Freq: Once | INTRAVENOUS | Status: AC
  Administered 2019-03-17: 2 g via INTRAVENOUS
  Filled 2019-03-17: qty 50

## 2019-03-17 MED ORDER — SODIUM CHLORIDE 0.9% FLUSH: 10.0000 mL | INTRAVENOUS | Status: DC | PRN

## 2019-03-17 MED ORDER — IOHEXOL 300 MG/ML  SOLN
100.0000 mL | Freq: Once | INTRAMUSCULAR | Status: AC | PRN
  Administered 2019-03-17: 100 mL via INTRAVENOUS

## 2019-03-17 NOTE — Progress Notes (Signed)
Eventful day for Mrs. Cifuentes. Abdomen profoundly distended this morning->NG tube placed. 400cc out within first 30 minutes. CT Abdomen ordered where patient vomited while laying flat. Suspect for aspiration as O2 sat soon dropped to upper 70s/lower 80s where patient had previously been on RA. Rapid Response RN called to CT bedside, aware of situation. NGT hooked back up to suction dumping 500cc in ~10 minutes (during scan). CT abdomen noted possible PNA/aspiration. Discussed with Dr. Dwain Sarna at bedside upon return to unit. Asked for foley placement for accurate I&Os and critically ill patient. Foley dumped 215cc upon placement, UA sent. NGT output slowed considerably, ~150cc out now, on low intermittent suction.  Currently:  Slightly tachycardic in the 100s Mid-90s on 2L O2 *Tachypnic upper 20s-low 30s RR Moderate UOP with foley placement at 215cc BP stable 120s/70s  Abdominal incision clean/dry/intact, skin without redness/irritation Ttremors noted (baseline per pt) Continues to be drowsy, even lethargic, but no change in mentation from this morning.  Plans to place PICC line and start TPN later this afternoon.  Will continue to monitor. Lorenza Cambridge, RN 03/17/19 2:51 PM [phone]

## 2019-03-17 NOTE — Progress Notes (Signed)
Pharmacy Antibiotic Note  Dana [name] is a 73 y.o. female admitted on 02/20/2019 with cecal volvulus s/p ex lap with right colectomy on 02/09/2019. CT scan on 5/6 was consistent with ileus which has persisted. Repeat CT scan today shows pneumonia. Pharmacy has been consulted for Vancomycin and Meropenem dosing. MD aware of penicillin allergy. Penicillin allergy documented as causing rash ; patient has received cephalosporins in the past. Instructed RN to monitor patient for any reaction to Meropenem.   Plan: Vancomycin 1g IV q36h Plan for peak/trough levels at steady state, as indicated Consider checking MRSA PCR Meropenem 1g IV q12h Monitor renal function, cultures, clinical course  Height: 5\' 4"  (162.6 cm) Weight: 97 lb 3.6 oz (44.1 kg) IBW/kg (Calculated) : 54.7  Temp (24hrs), Avg:98.4 F (36.9 C), Min:98.3 F (36.8 C), Max:98.8 F (37.1 C)  Recent Labs  Lab 03/12/19 0949 03/13/19 0452 03/14/19 0232 03/15/19 0401 03/17/19 0538  WBC  --  15.8* 12.2* 11.0* 30.6*  CREATININE 0.53 0.63 0.65 0.44 0.47    Estimated Creatinine Clearance: 43 mL/min (by C-G formula based on SCr of 0.47 mg/dL).    Allergies  Allergen Reactions  . Penicillins Rash    PATIENT HAS HAD A PCN REACTION WITH IMMEDIATE RASH, FACIAL/TONGUE/THROAT SWELLING, SOB, OR LIGHTHEADEDNESS WITH HYPOTENSION:  #  #  YES  #  #  Has patient had a PCN reaction causing severe rash involving mucus membranes or skin necrosis: NO Has patient had a PCN reaction that required hospitalization NO Has patient had a PCN reaction occurring within the last 10 years: NO  . Sinemet [Carbidopa-Levodopa] Other (See Comments)    Reports lips/fingertips swelling, red face.  . Hydrocodone Itching  . Lactose Intolerance (Gi) Diarrhea    ANY MILK PRODUCTS  . Reclast [Zoledronic Acid] Rash    Rash   . Sulfa Antibiotics Rash    Antimicrobials this admission: 4/28 Clindamycin, Gentamicin pre-op 5/10 Vancomycin >>  5/10 Meropenem >>    Microbiology results: None ordered  Thank you for allowing pharmacy to be a part of this patient's care.   Greer Pickerel, PharmD, BCPS Pager: [phone] 03/17/2019 2:24 PM

## 2019-03-17 NOTE — Progress Notes (Addendum)
[facility] Progress Note  12 Days Post-Op  Subjective: CC-  Patient withdrawn this morning. Answers questions appropriately but does not appear to feel well. Complaining of abdominal and low back pain. RN states that she spit up a could of times. She is still passing some flatus and had a BM yesterday. She does report some dysuria. WBC up to 30.6, afebrile. VSS.   Objective: Vital signs in last 24 hours: Temp:  [98.3 F (36.8 C)-98.4 F (36.9 C)] 98.4 F (36.9 C) (05/10 0727) Pulse Rate:  [88-95] 91 (05/10 0727) Resp:  [20-24] 20 (05/10 0727) BP: (132-188)/(80-103) 133/85 (05/10 0727) SpO2:  [94 %-97 %] 94 % (05/10 0727) Weight:  [44.1 kg] 44.1 kg (05/10 0400) Last BM Date: 03/12/19  Intake/Output from previous day: 05/09 0701 - 05/10 0700 In: 675 [I.V.:675] Out: -  Intake/Output this shift: No intake/output data recorded.  PE: Gen:  Alert, NAD but does not appear to feel well HEENT: EOM's intact, pupils equal and round Pulm:  CTAB, no W/R/R, effort normal Cardio: RRR Abd: Soft, distended (more than yesterday), mild diffuse tenderness without rebound or guarding, open midline incision cdi with proximal aspect of wound closing and distal portion with beefy red tissue and no erythema or drainage Skin: warm and dry  Psych: Alert and oriented to person, place, and time  Lab Results:  Recent Labs    03/15/19 0401 03/17/19 0538  WBC 11.0* 30.6*  HGB 10.5* 11.5*  HCT 30.6* 33.8*  PLT 540* 663*   BMET Recent Labs    03/15/19 0401 03/17/19 0538  NA 133* 128*  K 3.3* 3.7  CL 97* 85*  CO2 30 30  GLUCOSE 126* 142*  BUN 8 12  CREATININE 0.44 0.47  CALCIUM 8.5* 8.7*   PT/INR No results for input(s): LABPROT, INR in the last 72 hours. CMP     Component Value Date/Time   NA 128 (L) 03/17/2019 0538   K 3.7 03/17/2019 0538   CL 85 (L) 03/17/2019 0538   CO2 30 03/17/2019 0538   GLUCOSE 142 (H) 03/17/2019 0538   BUN 12 03/17/2019 0538   CREATININE  0.47 03/17/2019 0538   CALCIUM 8.7 (L) 03/17/2019 0538   PROT 7.3 02/11/2019 2330   ALBUMIN 4.6 03/01/2019 2330   AST 34 02/24/2019 2330   ALT 31 02/09/2019 2330   ALKPHOS 75 02/09/2019 2330   BILITOT 0.7 02/17/2019 2330   GFRNONAA >60 03/17/2019 0538   GFRAA >60 03/17/2019 0538   Lipase     Component Value Date/Time   LIPASE 32 02/22/2019 2330       Studies/Results: No results found.  Anti-infectives: Anti-infectives (From admission, onward)   Start     Dose/Rate Route Frequency Ordered Stop   June 09, 2019 0515  clindamycin (CLEOCIN) IVPB 900 mg     900 mg 100 mL/hr over 30 Minutes Intravenous To Surgery June 09, 2019 0425 June 09, 2019 0531   June 09, 2019 0515  gentamicin (GARAMYCIN) 210 mg in dextrose 5 % 100 mL IVPB     5 mg/kg  42.2 kg 105.3 mL/hr over 60 Minutes Intravenous STAT June 09, 2019 0425 June 09, 2019 0604       Assessment/Plan Cervical dystonia ABLAnemia - hbg 11.5, stable Delirium -continues to have intermittent confusion. Minimize sedating/narcotic meds. Orient her to time of day. Keep blinds open during the day. Protein Calorie Malnutrition- Pre-albumin 9.3 (5/8). Reports she normally takes muscle milk and carnations instant breakfast at home. Will try these when able.Does not tolerate boost or ensure.Nutrition following. Elevated Blood  Pressure/tachycardia -BP intermittently elevated, tachycardia improved. Vitals stable this AM.No history of HTN. 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 - Pathis benign with no evidence of colitis or malignant process - persistent ileus  FEN -NPO,IVF (D5 0.9 NS w/ K) VTE -Lovenox, SCDs ID -Clinda/Gent periop, afebrile. ua (5/8) and CXR (5/6) neg Foley - DC 4/29 Follow up - Dwain Sarna POC - Husband Lawson Manjarrez [phone] or [phone])  Plan: WBC significantly up  today, afebrile. Abdomen tender/more distended but no peritonitis. Recent negative u/a and CXR. last CT abdomen on 5/6 consistent with ileus.  Recheck u/a due to dysuria complaints. Acute change from yesterday, repeat CT abdomen/pelvis. Place NG tube. Change IVF to D5 NS with K and increase to 121mL/hr. Replace magnesium.  May need to consider PICC/TPN soon. Labs in AM.   LOS: 12 days    Franne Forts , [facility] Surgery 03/17/2019, 9:12 AM Pager: [phone] Mon-Thurs 7:00 am-4:30 pm Fri 7:00 am -11:30 AM Sat-Sun 7:00 am-11:30 am

## 2019-03-17 NOTE — Progress Notes (Signed)
Spoke with Lequita Halt, RN concerning PICC. It is ordered for TNA administration. Patient not able to sign consent, due to lethargy, and telephone consent will be required. Plan to place later this pm.

## 2019-03-17 NOTE — Significant Event (Signed)
Rapid Response Event Note  Overview: Respiratory - Aspiration  Initial Focused Assessment: Called by nurse to assess patient in CT. Per nurse, patient had significant abdominal distension and NGT was placed this morning. Patient vomited while laying flat in CT, oxygen saturations dropped into the uppers 70s per nurse. RN placed patient on NRB. When I arrived, oxygen saturations were 100% on NRB. Patient was not respiratory distress, quite lethargic, per nurse has been lethargic today. HR was in the 90s, stable BP. NRB weaned off to 4L [place].   Interventions: -- CXR   Plan of Care: -- I paged [facility] PA, Per PA, will obtain CXR to evaluate for aspiration. I went to see another patient. MD came back and saw the patient as well per chart. While in CT, NGT output was 500 cc -- Monitor for signs of respiratory distress, wean oxygen as patient tolerates.  -- Encourage IS and pulmonary toileting, OOB if ordered or allowed by MD.  -- RN called for update at 1345 -- Foley was placed, UOP -215 cc. 900cc total of NGT output. Saturations are maintaining > 93% on 2L. Will treat for PNA with ABX as well.   Event Summary:    at    [facility] Time 1104 Arrival Time 1106 End Time 1135  Malani Lees R

## 2019-03-17 NOTE — Progress Notes (Addendum)
PHARMACY - ADULT TOTAL PARENTERAL NUTRITION CONSULT NOTE   Pharmacy Consult for TPN Indication: Prolonged Ileus  Patient Measurements: Height: 5\' 4"  (162.6 cm) Weight: 97 lb 3.6 oz (44.1 kg) IBW/kg (Calculated) : 54.7 TPN AdjBW (KG): 42.2 Body mass index is 16.69 kg/m. Usual Weight: 80-90 lbs per patient *Weight fluctuating this admission 83 lbs up to 98 lbs.   Assessment: 73 year old female with history of colitis and weight loss admitted with cecal volvulus s/p ex lap with right colectomy on 03/06/2019. CT scan on 5/6 was consistent with ileus which has persisted. Day #13 of admission with minimal intake and intermittently NPO due the N/V. Pharmacy consulted to start TPN for persistent ileus.   GI: Pre-albumin on 5/8 was 9.3. Abdomen tender, distended. No emesis. Placing NGT 5/10 and plan for repeat CT. +Flatus. LBM 5/9. Sucralfate BID. Simethicone QID.  Endo: No hx DM. BG 142.  Insulin requirements in the past 24 hours: 0 Lytes: Na/Cl low (128/85) on D5-NS, K 3.7 (20K in IVF), Mg low at 1.5 (will replace). No Phos available (will check). Ca 8.5 (no recent albumin).  Renal: SCr 0.44, BUN 12; good UOP. Net -1.7L.  Pulm: RA Cards: Hypertensive at times. NSR. On Lopressor.  Hepatobil: Last LFTs on 4/27 were wnl.  Neuro: APAP scheduled q6h. PRN Morphine. Tenderness noted. Pain score 0. Wellbutrin, Lexapro, and Clonazepam for anxiety/depression.  ID: WBC elevated significantly at 30.6, afebrile. No current antibiotics.   TPN Access: PICC to be placed 5/10 per RN TPN start date: 03/17/19 Nutritional Goals (per RD recommendation on 5/6): KCal: 1400-1600 Protein: 70-80 kg Fluid: 1.6L/day  Goal TPN rate is 60 ml/hr (provides 80 g protein, 230 g dextrose, 43.2g lipids and 1535 kcals meeting 100% of patients needs)  Current Nutrition:  Full liquids - poor appetite  Plan:  Initiate TPN at 74mL/hr. This TPN provides 40 g of protein, 115 g of dextrose, and 21.6 g of lipids which provides  767.5 kCals per day, meeting 50% of patient needs Electrolytes in TPN: standard for now; maximize Cl.  Add MVI to TPN- trace elements are on back order, will only place in TPN on Monday, Wednesday and Friday. Add sensitive SSI q6h and adjust as needed Give Magnesium 2g IV x1.  Change IVMF to NS + 20K at 70 ml/hr when new TPN hung.  Monitor TPN labs, checking Phos today and will replace outside of TPN if low. F/U RD recommendations, resolution of ileus and ability to advance diet  Link Snuffer, PharmD, BCPS, BCCCP Clinical Pharmacist Please refer to [facility] for [facility] numbers 03/17/2019,10:31 AM  Addendum: Phos 2.8 - Will give NaPhos IV x1 and f/up with AM labs.

## 2019-03-17 NOTE — Progress Notes (Signed)
Patient ID: Dana [name], female   DOB: 1946/09/29, 73 y.o.   MRN: [id number] Saw patient after ct scan again. She likely aspirated around ng placement.  Question had she already done this as well.  Ct shows lll pna.  Her sats are now 100 on [place].  Her abdomen is less distended with over liter out.  Had small ng placed so hopefully will continue to function.  She is not tender. Will start her on abx for likely pna with pcn allergy.  Will also replace foley to monitor uop. Leave on monitor for today. Will have picc later and tpn started.  She has fluid on ct scan that she had previously. She has been having some bowel function. Still somewhat concerned about possible issue with anastomosis or small bowel but no real indication to reexplore right now.  I discussed this over the phone with her husband Dana [name].

## 2019-03-17 NOTE — Progress Notes (Signed)
Peripherally Inserted Central Catheter/Midline Placement  The IV Nurse has discussed with the patient and/or persons authorized to consent for the patient, the purpose of this procedure and the potential benefits and risks involved with this procedure.  The benefits include less needle sticks, lab draws from the catheter, and the patient may be discharged home with the catheter. Risks include, but not limited to, infection, bleeding, blood clot (thrombus formation), and puncture of an artery; nerve damage and irregular heartbeat and possibility to perform a PICC exchange if needed/ordered by physician.  Alternatives to this procedure were also discussed.  Bard Power PICC patient education guide, fact sheet on infection prevention and patient information card has been provided to patient /or left at bedside.  Telephone consent obtained from Dana [name], husband.  PICC/Midline Placement Documentation  PICC Double Lumen 03/17/19 PICC Left Basilic 35 cm 0 cm (Active)  Indication for Insertion or Continuance of Line Administration of hyperosmolar/irritating solutions (i.e. TPN, Vancomycin, etc.) 03/17/2019  4:04 PM  Exposed Catheter (cm) 0 cm 03/17/2019  4:04 PM  Site Assessment Clean;Dry;Intact 03/17/2019  4:04 PM  Lumen #1 Status Flushed;Saline locked;Blood return noted 03/17/2019  4:04 PM  Lumen #2 Status Flushed;Saline locked;Blood return noted 03/17/2019  4:04 PM  Dressing Type Transparent 03/17/2019  4:04 PM  Dressing Status Clean;Dry;Intact;Antimicrobial disc in place 03/17/2019  4:04 PM  Line Care Connections checked and tightened 03/17/2019  4:04 PM  Line Adjustment (NICU/IV Team Only) No 03/17/2019  4:04 PM  Dressing Intervention New dressing 03/17/2019  4:04 PM  Dressing Change Due 03/24/19 03/17/2019  4:04 PM       Elliot Dally 03/17/2019, 4:05 PM

## 2019-03-17 NOTE — Progress Notes (Signed)
Patient ID: Dana [name], female   DOB: Dec 05, 1945, 73 y.o.   MRN: [id number] Febrile, low grade tachycardia, rr 28, normotensive. She is awake/alert, responsive, looks better than earlier with ng in draining.  I think she is ok to stay where she is on monitor.  No need for bipap etc.

## 2019-03-17 NOTE — Progress Notes (Signed)
Evening rounds  Dana [name] remains tachypneic, NAD, low grade temp, normotensive, HR 90s. Sats 93% on 3L [place]. NGT draining. She woke with slight stimulation and answered questions.  Will continue to monitor.  Vital Signs MEWS/VS Documentation      03/17/2019 1608 03/17/2019 1622 03/17/2019 1637 03/17/2019 1911   MEWS Score:  5  5  6  6    MEWS Score Color:  Red  Red  Red  Red   Resp:  (!) 33  (!) 31  (!) 28  -   Pulse:  (!) 110  (!) 108  (!) 108  -   BP:  (!) 102/57  111/64  99/67  -   Temp:  (!) 100.7 F (38.2 C)  -  -  -   O2 Device:  Nasal Cannula  Nasal Cannula  Nasal Cannula  -   O2 Flow Rate (L/min):  3 L/min  3 L/min  3 L/min  Rose Fillers 03/17/2019,8:31 PM

## 2019-03-17 NOTE — Plan of Care (Signed)
  Pain management as ordered, with continuous redirection and reorientation to ensure safety, small amount of food /fluids encouraged, VSS. Problem: Clinical Measurements:  Goal: Will remain free from infection Outcome: Progressing   Problem: Pain Managment: Goal: General experience of comfort will improve Outcome: Progressing   Problem: Safety: Goal: Ability to remain free from injury will improve Outcome: Progressing   Problem: Nutrition: Goal: Adequate nutrition will be maintained Outcome: Progressing

## 2019-03-17 NOTE — Significant Event (Signed)
Rapid Response Event Note   I came by to see patient again at 1600 -- Patient is now tachycardiac, RR in the 30s, per nurse is more tachypneic now. Upon arrival, patient felt very warm to touch, rectal temperature 100.7 Patient is still lethargic, mental status/LOC has not changed as the day has gone on. Once awake, answers questions but quickly falls asleep. HR 100-110s - appears ST, RR 30s, 100 % on 3L [place]. Lung sounds - clears in the upper fields, diminished in the lower fields (LT worse than RT). Not in acute distress, no increased work of breathing, mild neck pain per patient, denies CP or abdominal pain.   CCS MD paged by nurse.   Interventions: -- APAP PR  -- STAT ABG

## 2019-03-18 ENCOUNTER — Inpatient Hospital Stay (HOSPITAL_COMMUNITY): Payer: Medicare Other

## 2019-03-18 LAB — CBC
HCT: 28.3 % — ABNORMAL LOW (ref 36.0–46.0)
Hemoglobin: 9.8 g/dL — ABNORMAL LOW (ref 12.0–15.0)
MCH: 31.9 pg (ref 26.0–34.0)
MCHC: 34.6 g/dL (ref 30.0–36.0)
MCV: 92.2 fL (ref 80.0–100.0)
Platelets: 528 10*3/uL — ABNORMAL HIGH (ref 150–400)
RBC: 3.07 MIL/uL — ABNORMAL LOW (ref 3.87–5.11)
RDW: 15.4 % (ref 11.5–15.5)
WBC: 33 10*3/uL — ABNORMAL HIGH (ref 4.0–10.5)
nRBC: 0 % (ref 0.0–0.2)

## 2019-03-18 LAB — DIFFERENTIAL
Abs Immature Granulocytes: 0.57 10*3/uL — ABNORMAL HIGH (ref 0.00–0.07)
Basophils Absolute: 0.2 10*3/uL — ABNORMAL HIGH (ref 0.0–0.1)
Basophils Relative: 1 %
Eosinophils Absolute: 0 10*3/uL (ref 0.0–0.5)
Eosinophils Relative: 0 %
Immature Granulocytes: 2 %
Lymphocytes Relative: 3 %
Lymphs Abs: 0.9 10*3/uL (ref 0.7–4.0)
Monocytes Absolute: 0.6 10*3/uL (ref 0.1–1.0)
Monocytes Relative: 2 %
Neutro Abs: 30.9 10*3/uL — ABNORMAL HIGH (ref 1.7–7.7)
Neutrophils Relative %: 92 %
WBC Morphology: INCREASED

## 2019-03-18 LAB — COMPREHENSIVE METABOLIC PANEL
ALT: 28 U/L (ref 0–44)
AST: 20 U/L (ref 15–41)
Albumin: 2.3 g/dL — ABNORMAL LOW (ref 3.5–5.0)
Alkaline Phosphatase: 76 U/L (ref 38–126)
Anion gap: 10 (ref 5–15)
BUN: 15 mg/dL (ref 8–23)
CO2: 28 mmol/L (ref 22–32)
Calcium: 8.3 mg/dL — ABNORMAL LOW (ref 8.9–10.3)
Chloride: 89 mmol/L — ABNORMAL LOW (ref 98–111)
Creatinine, Ser: 0.47 mg/dL (ref 0.44–1.00)
GFR calc Af Amer: >60 mL/min (ref >60)
GFR calc non Af Amer: >60 mL/min (ref >60)
Glucose, Bld: 123 mg/dL — ABNORMAL HIGH (ref 70–99)
Potassium: 3.6 mmol/L (ref 3.5–5.1)
Sodium: 127 mmol/L — ABNORMAL LOW (ref 135–145)
Total Bilirubin: 0.9 mg/dL (ref 0.3–1.2)
Total Protein: 4.9 g/dL — ABNORMAL LOW (ref 6.5–8.1)

## 2019-03-18 LAB — GLUCOSE, CAPILLARY
Glucose-Capillary: 125 mg/dL — ABNORMAL HIGH (ref 70–99)
Glucose-Capillary: 155 mg/dL — ABNORMAL HIGH (ref 70–99)
Glucose-Capillary: 142 mg/dL — ABNORMAL HIGH (ref 70–99)

## 2019-03-18 LAB — PREALBUMIN: Prealbumin: 6.3 mg/dL — ABNORMAL LOW (ref 18–38)

## 2019-03-18 LAB — PHOSPHORUS: Phosphorus: 3.4 mg/dL (ref 2.5–4.6)

## 2019-03-18 LAB — TRIGLYCERIDES: Triglycerides: 39 mg/dL (ref <150)

## 2019-03-18 LAB — TROPONIN I
Troponin I: 0.04 ng/mL (ref <0.03)
Troponin I: 0.04 ng/mL (ref <0.03)
Troponin I: 0.04 ng/mL (ref <0.03)

## 2019-03-18 LAB — MAGNESIUM: Magnesium: 1.7 mg/dL (ref 1.7–2.4)

## 2019-03-18 MED ORDER — HYDROXYZINE HCL 50 MG/ML IM SOLN
25.0000 mg | Freq: Three times a day (TID) | INTRAMUSCULAR | Status: DC | PRN
  Administered 2019-03-18: 25 mg via INTRAMUSCULAR
  Filled 2019-03-18 (×2): qty 0.5

## 2019-03-18 MED ORDER — TRAVASOL 10 % IV SOLN
INTRAVENOUS | Status: AC
  Administered 2019-03-18: 18:00:00 via INTRAVENOUS
  Filled 2019-03-18: qty 799.2

## 2019-03-18 MED ORDER — SODIUM CHLORIDE 0.9 % IV SOLN
INTRAVENOUS | Status: DC
  Administered 2019-03-18: 03:00:00 via INTRAVENOUS

## 2019-03-18 MED ORDER — MAGNESIUM SULFATE 2 GM/50ML IV SOLN
2.0000 g | Freq: Once | INTRAVENOUS | Status: AC
  Administered 2019-03-18: 2 g via INTRAVENOUS
  Filled 2019-03-18: qty 50

## 2019-03-18 MED ORDER — POTASSIUM CHLORIDE 10 MEQ/50ML IV SOLN
10.0000 meq | INTRAVENOUS | Status: AC
  Administered 2019-03-18 (×4): 10 meq via INTRAVENOUS
  Filled 2019-03-18 (×4): qty 50

## 2019-03-18 MED ORDER — IPRATROPIUM-ALBUTEROL 0.5-2.5 (3) MG/3ML IN SOLN: 3.0000 mL | Freq: Four times a day (QID) | RESPIRATORY_TRACT | Status: DC | PRN

## 2019-03-18 NOTE — Progress Notes (Signed)
PHARMACY - ADULT TOTAL PARENTERAL NUTRITION CONSULT NOTE   Pharmacy Consult for TPN Indication: Prolonged Ileus  Patient Measurements: Height: 5\' 4"  (162.6 cm) Weight: 95 lb 14.4 oz (43.5 kg) IBW/kg (Calculated) : 54.7 TPN AdjBW (KG): 42.2 Body mass index is 16.46 kg/m. Usual Weight: 80-90 lbs per patient *Weight fluctuating this admission 83 lbs up to 98 lbs.   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 CT scan on 5/6 was consistent with ileus which has persisted. Day #13 of admission with minimal intake and intermittently NPO due the N/V. Pharmacy consulted to start TPN for persistent ileus.   GI: Pre-albumin 9.3>>6.3. Abdomen tender, distended. NGT output 1300 ml. +Flatus. LBM 5/9. Sucralfate BID. Simethicone QID.  Endo: No hx DM. BG 142.  Insulin requirements in the past 24 hours: 0 Lytes: Na/Cl low (127/89) on D5-NS, K 3.6 (20K in IVF), Mg low at 1.7 (goal > 2 for ileus). Phos 3.4. Ca 9.7 (albumin 2.3).  Renal: SCr 0.47, BUN 15; UOP 0.64ml/kg/hr.  Pulm: RA Cards: Hypertensive at times. NSR. On Lopressor.  Hepatobil: LFTs wnl. TGs 39 Neuro: APAP scheduled q6h. PRN Morphine. Tenderness noted. Pain score 0. Wellbutrin, Lexapro, and Clonazepam for anxiety/depression.  ID: WBC elevated significantly at 30.6>>33, afebrile. Started 5/10 on Merrem and Vanc   TPN Access: PICC to be placed 5/10 per RN TPN start date: 03/17/19 Nutritional Goals (per RD recommendation on 5/6): KCal: 1400-1600 Protein: 70-80 kg Fluid: 1.6L/day  Goal TPN rate is 60 ml/hr (provides 80 g protein, 230 g dextrose, 43.2g lipids and 1535 kcals meeting 100% of patients needs)  Current Nutrition:  Full liquids - poor appetite  Plan:  Increase TPN to 58mL/hr. This TPN provides 80 g protein, 230 g dextrose, 43.2g lipids and 1535 kcals meeting 100% of patients needs Electrolytes in TPN: Increase Na to 75 meq/L,  other lytes standard; maximize Cl.  Add MVI to TPN- trace elements are on back order, will only place in TPN on Monday, Wednesday and Friday. Add sensitive SSI q6h and adjust as needed Give Magnesium 2g IV x1.  Give KCl 10 meq IV x 4 Monitor TPN labs Will order BMET, Mag and Phos for Tues am F/U RD recommendations, resolution of ileus and ability to advance diet   Jeanella Cara, PharmD, [facility] Clinical Pharmacist Please see AMION for all Pharmacists' Contact Phone Numbers 03/18/2019, 7:46 AM

## 2019-03-18 NOTE — Progress Notes (Signed)
   03/18/19 1600  Clinical Encounter Type  Visited With Patient  Visit Type Initial;Spiritual support  Referral From Nurse  Consult/Referral To Chaplain  Spiritual Encounters  Spiritual Needs Emotional;Prayer   Responded to spiritual care consult. Nurse stated PT may be having early signs of  Dementia, before entering room. Nurse also stated that pt has been in the Hospital for a while and that there were other concerns for the well being that pt was claiming. Pt was alert and sitting upright on her chair. PT stated she wanted me to reach out to family, however I asked if she has been able to talk to a Child psychotherapist concerning her concerns. She said she has. Pt started talking about her faith in God and asked if I would prayer for her kids which she stated "they need to get right with God." I offered her spiritual care with ministry of presence, empathic listening, words of comfort and prayer. PT kept her eyes closed and thank me for the time I spent with her. Chaplain available upon request.  Chaplain Orest Dikes  [phone]

## 2019-03-18 NOTE — Progress Notes (Signed)
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 Pt with delirium post op. PMH - anxiety, depression, cervical dystonia, IBS, cervical spondylosis, osteoporosis, lt wrist fx, rt rotator cuff repair   OT comments  Pt presenting with decreased activity tolerance as seen by decreased SpO2, quick fatigue, and required seated rest breaks. Pt presenting with decreased attention and problem solving and repeating topic of needing a "guardian for protection." Pt performing functional mobility with RW and Min guard A to sink. Pt brushing her hair while standing at sink and required to sit to perform oral care due to fatigue. Spo2 ranging between 89-86% on 6L HFNC. Cueing pt for purse lip breathing; concerned pt is unintentionally hold her breath as she makes a nervous "sour" facial expression and holds this position. When asked why pt is making this expression, she says she is so nervous. Pending pt progress, continue to recommend dc home with HHOT. However, [facility] Will continue to follow acutely as admitted.    Follow Up Recommendations  Home health OT;Supervision/Assistance - 24 hour(Pending pt progress)    Equipment Recommendations  Tub/shower seat    Recommendations for Other Services PT consult    Precautions / Restrictions Precautions Precautions: Fall Restrictions Weight Bearing Restrictions: No       Mobility Bed Mobility Overal bed mobility: Needs Assistance Bed Mobility: Supine to Sit     Supine to sit: Min guard     General bed mobility comments: use of bed rail, min guard for safety  Transfers Overall transfer level: Needs assistance Equipment used: None Transfers: Sit to/from Stand Sit to Stand: Min guard         General transfer comment: Min Guard A for safety  and cues for hand placement    Balance Overall balance assessment: Needs assistance Sitting-balance support: No upper extremity supported;Feet supported Sitting balance-Leahy Scale: Good Sitting balance - Comments: pt seated in chair at sink with intermittent back support for ~20 minutes while reaching within and outside of her BoS for various objects and completed ADL/grooming tasks   Standing balance support: No upper extremity supported;During functional activity Standing balance-Leahy Scale: Fair Standing balance comment: static standing with supervision                           ADL either performed or assessed with clinical judgement   ADL Overall ADL's : Needs assistance/impaired Eating/Feeding: NPO   Grooming: Oral care;Brushing hair;Minimal assistance;Standing;Sitting Grooming Details (indicate cue type and reason): Pt with poor activity tolerance nad required seated rest breaks. Pt brushing her hair while standing and then requesting to sit. Pt maintained seated position for oral care and required Min A to prep swab at sink                  Toilet Transfer: Min Social research officer, governmentguard;RW;Ambulation;BSC Toilet Transfer Details (indicate cue type and reason): Min GUard A for safety with toielt transfer to [facility].   Toileting - Clothing Manipulation Details (indicate cue type and reason): [facility]     Functional mobility during ADLs: Min guard;Rolling walker General ADL Comments: Pt presenting with decreased activity toelrance compared to prior session. Pt making face as if she is tasting something sour. When asked why, she reported that she was afriad  and nervous     Vision   Vision Assessment?: No apparent visual deficits   Perception     Praxis      Cognition Arousal/Alertness: Awake/alert Behavior During Therapy: Anxious Overall Cognitive Status: Impaired/Different from baseline Area of Impairment: Memory;Safety/judgement;Problem solving;Following  commands;Awareness;Attention                 Orientation Level: Disoriented to;Time Current Attention Level: Sustained Memory: Decreased short-term memory Following Commands: Follows one step commands consistently;Follows one step commands with increased time Safety/Judgement: Decreased awareness of deficits Awareness: Emergent Problem Solving: Requires verbal cues General Comments: Pt perseverating on topic of being afraid and wanting adult protective services. Pt presenting with anxiety about mobility and requires increased cues and education.         Exercises     Shoulder Instructions       General Comments SpO2 5L 87% [place]. Cued for purse lip breathing and she elevated to 89% on 6L HFNC.  HR 114 resting. HR 125 with activity. RR 30s during activity    Pertinent Vitals/ Pain       Pain Assessment: Faces Faces Pain Scale: Hurts even more Pain Location: abdomen Pain Descriptors / Indicators: Guarding;Sore Pain Intervention(s): Monitored during session;Limited activity within patient's tolerance;Repositioned  Home Living                                          Prior Functioning/Environment              Frequency  Min 3X/week        Progress Toward Goals  OT Goals(current goals can now be found in the care plan section)  Progress towards OT goals: Progressing toward goals  Acute Rehab OT Goals Patient Stated Goal: go home OT Goal Formulation: With patient Time For Goal Achievement: 03/26/19 Potential to Achieve Goals: Good ADL Goals Pt Will Perform Grooming: with modified independence;standing Pt Will Perform Lower Body Dressing: with modified independence;sit to/from stand Pt Will Transfer to Toilet: with modified independence;ambulating;regular height toilet Pt Will Perform Toileting - Clothing Manipulation and hygiene: with modified independence;sit to/from stand Pt Will Perform Tub/Shower Transfer: Tub transfer;with min guard  assist;ambulating;shower seat  Plan Discharge plan remains appropriate    Co-evaluation                 AM-PAC OT "6 Clicks" Daily Activity     Outcome Measure   Help from another person eating meals?: None Help from another person taking care of personal grooming?: A Little Help from another person toileting, which includes using toliet, bedpan, or urinal?: A Little Help from another person bathing (including washing, rinsing, drying)?: A Little Help from another person to put on and taking off regular upper body clothing?: A Little Help from another person to put on and taking off regular lower body clothing?: A Little 6 Click Score: 19    End of Session Equipment Utilized During Treatment: Rolling walker;Oxygen(5-6L)  OT Visit Diagnosis: Unsteadiness on feet (R26.81);Other abnormalities of gait and mobility (R26.89);Muscle weakness (generalized) (M62.81)   Activity Tolerance Patient tolerated treatment well   Patient Left in chair;with call bell/phone within reach;with chair alarm set   Nurse Communication Mobility status        Time: 4235-3614 OT Time Calculation (min): 31 min  Charges: OT General Charges $OT Visit: 1 Visit OT Treatments $Self Care/Home Management : 23-37 mins  Sabrine Patchen MSOT, OTR/L Acute Rehab Pager: [phone] Office: [phone]   Theodoro Grist Jakaleb Payer 03/18/2019, 10:20 AM

## 2019-03-18 NOTE — Progress Notes (Signed)
[facility] Progress Note  13 Days Post-Op  Subjective: CC: ileus Patient reports she feels weak and shaky this AM. TPN started yesterday, NGT replaced yesterday for ileus and aspiration. Patient denies flatus or BM.   Patient able to tell me who she is and that she's in [facility] this AM but keeps stating that she needs some sort of guardian and that her husband is conspiring against her.   Objective: Vital signs in last 24 hours: Temp:  [98.4 F (36.9 C)-100.9 F (38.3 C)] 98.4 F (36.9 C) (05/11 0327) Pulse Rate:  [98-114] 111 (05/11 0545) Resp:  [20-33] 26 (05/11 0545) BP: (99-127)/(55-69) 126/55 (05/11 0327) SpO2:  [91 %-98 %] 96 % (05/11 0545) Weight:  [43.5 kg] 43.5 kg (05/10 1033) Last BM Date: 03/16/19  Intake/Output from previous day: 05/10 0701 - 05/11 0700 In: 1136.5 [I.V.:544.6; IV Piggyback:591.9] Out: 2155 [Urine:855; Emesis/NG output:1300] Intake/Output this shift: No intake/output data recorded.  PE: Gen: Alert, NAD but does not appear to feel well Pulm: on 5L via [place] with O2 sat 95%, some rales in L lower lung fields Cardio: sinus tachycardia Abd: Soft, hypoactive BS, mild diffuse tenderness without rebound or guarding, open midline incision cdi with proximal aspect of wound closing and distal portion with beefy red tissue and no erythema or drainage Skin: warm and dry  Psych: Alert and oriented to person, place, and time  Lab Results:  Recent Labs    03/17/19 0538 03/18/19 0325  WBC 30.6* 33.0*  HGB 11.5* 9.8*  HCT 33.8* 28.3*  PLT 663* 528*   BMET Recent Labs    03/17/19 0538 03/18/19 0325  NA 128* 127*  K 3.7 3.6  CL 85* 89*  CO2 30 28  GLUCOSE 142* 123*  BUN 12 15  CREATININE 0.47 0.47  CALCIUM 8.7* 8.3*   PT/INR No results for input(s): LABPROT, INR in the last 72 hours. CMP     Component Value Date/Time   NA 127 (L) 03/18/2019 0325   K 3.6 03/18/2019 0325   CL 89 (L) 03/18/2019 0325   CO2 28 03/18/2019  0325   GLUCOSE 123 (H) 03/18/2019 0325   BUN 15 03/18/2019 0325   CREATININE 0.47 03/18/2019 0325   CALCIUM 8.3 (L) 03/18/2019 0325   PROT 4.9 (L) 03/18/2019 0325   ALBUMIN 2.3 (L) 03/18/2019 0325   AST 20 03/18/2019 0325   ALT 28 03/18/2019 0325   ALKPHOS 76 03/18/2019 0325   BILITOT 0.9 03/18/2019 0325   GFRNONAA >60 03/18/2019 0325   GFRAA >60 03/18/2019 0325   Lipase     Component Value Date/Time   LIPASE 32 02/16/2019 2330       Studies/Results: Dg Chest 1 View  Result Date: 03/18/2019 CLINICAL DATA:  73 year old female with vomiting and possible aspiration. EXAM: CHEST  1 VIEW COMPARISON:  Chest radiograph dated 03/17/2019 FINDINGS: Left-sided PICC with tip over central SVC. Enteric tube extends into the left hemiabdomen with tip beyond the inferior margin of the image. There is a background of emphysema. Left lung base densities may represent atelectasis or pneumonia. A small left pleural effusion may be present. Overall the appearance of the left lung base is similar to the study of 03/17/2019. Minimal right lung base atelectatic changes noted. There is no pneumothorax. Stable cardiac silhouette. No acute osseous pathology. IMPRESSION: 1. Left-sided PICC with tip over central SVC. 2. Left lung base airspace density as seen on the study 03/17/2019. Electronically Signed   By: Elgie Collard  M.D.   On: 03/18/2019 04:00   Ct Abdomen Pelvis W Contrast  Result Date: 03/17/2019 CLINICAL DATA:  73 year old female with continued abdominal distension and discomfort following RIGHT colon and terminal ileum resection on 02/19/2019. EXAM: CT ABDOMEN AND PELVIS WITH CONTRAST TECHNIQUE: Multidetector CT imaging of the abdomen and pelvis was performed using the standard protocol following bolus administration of intravenous contrast. CONTRAST:  100mL OMNIPAQUE IOHEXOL 300 MG/ML  SOLN COMPARISON:  03/13/2019 prior CTs FINDINGS: Lower chest: New LEFT LOWER lobe airspace disease/consolidation  likely represents pneumonia or aspiration. Small bilateral pleural effusions are present. Hepatobiliary: Liver is unchanged. Patient is status post cholecystectomy. No biliary dilatation. Pancreas: Unremarkable Spleen: Unremarkable Adrenals/Urinary Tract: The kidneys, bladder and adrenal glands are unremarkable. Stomach/Bowel: RIGHT colon/terminal ileum resection changes noted. Majority of the visualized small bowel loops are dilated and slightly increased in caliber since 03/13/2019. There is a suggestion of some distal small bowel loops that are not distended,, suspicious for a small bowel obstruction. There is no evidence of pneumoperitoneum or discrete abscess. An NG tube is present with tip in the mid-distal stomach. Vascular/Lymphatic: The visualized mesenteric arteries appear patent. Aortic atherosclerosis. No enlarged abdominal or pelvic lymph nodes. Reproductive: No significant abnormalities Other: Moderate to large amount and pelvis of ascites within the abdomen noted. Musculoskeletal: No acute or suspicious bony abnormalities. IMPRESSION: 1. New LEFT LOWER lobe airspace disease/consolidation likely representing pneumonia or aspiration. 2. Slightly increasing multiple dilated small bowel loop suspicious for bowel obstruction. No evidence of pneumoperitoneum. 3. Moderate to large amount of ascites and small bilateral pleural effusions. 4.  Aortic Atherosclerosis (ICD10-I70.0). Electronically Signed   By: Harmon PierJeffrey  Hu M.D.   On: 03/17/2019 12:06   Dg Chest Port 1 View  Result Date: 03/17/2019 CLINICAL DATA:  Aspiration EXAM: PORTABLE CHEST 1 VIEW COMPARISON:  03/12/2000 FINDINGS: LEFT LOWER lung airspace opacities likely represent aspiration or pneumonia. Cardiomediastinal silhouette is unchanged. An NG tube enters the stomach with tip off the field of view. Trace bilateral pleural effusions noted. No acute bony abnormality. IMPRESSION: LEFT LOWER lung airspace disease compatible with aspiration or  pneumonia. Trace bilateral pleural effusions. Electronically Signed   By: Harmon PierJeffrey  Hu M.D.   On: 03/17/2019 12:46   Dg Abd Portable 1v  Result Date: 03/17/2019 CLINICAL DATA:  Abdominal distension and pain. EXAM: PORTABLE ABDOMEN - 1 VIEW COMPARISON:  CT scan of Mar 13, 2019.  Radiograph of Mar 12, 2019. FINDINGS: Severe small bowel dilatation is noted concerning for distal small bowel obstruction. No colonic dilatation is noted. Status post cholecystectomy. No abnormal calcifications are noted. No colonic dilatation is noted. IMPRESSION: Severe small bowel dilatation is noted concerning for distal small bowel obstruction. Electronically Signed   By: Lupita RaiderJames  Green Jr M.D.   On: 03/17/2019 09:34   [place] Ekg Site Rite  Result Date: 03/17/2019 If Site Rite image not attached, placement could not be confirmed due to current cardiac rhythm.   Anti-infectives: Anti-infectives (From admission, onward)   Start     Dose/Rate Route Frequency Ordered Stop   03/18/19 0400  meropenem (MERREM) 1 g in sodium chloride 0.9 % 100 mL IVPB     1 g 200 mL/hr over 30 Minutes Intravenous Every 12 hours 03/17/19 1421     03/17/19 1430  meropenem (MERREM) 1 g in sodium chloride 0.9 % 100 mL IVPB     1 g 200 mL/hr over 30 Minutes Intravenous  Once 03/17/19 1421 03/17/19 1607   03/17/19 1400  vancomycin (VANCOCIN) IVPB 1000  mg/200 mL premix     1,000 mg 200 mL/hr over 60 Minutes Intravenous Every 36 hours 03/17/19 1259     02/08/2019 0515  clindamycin (CLEOCIN) IVPB 900 mg     900 mg 100 mL/hr over 30 Minutes Intravenous To Surgery 02/22/2019 0425 02/14/2019 0531   02/23/2019 0515  gentamicin (GARAMYCIN) 210 mg in dextrose 5 % 100 mL IVPB     5 mg/kg  42.2 kg 105.3 mL/hr over 60 Minutes Intravenous STAT 02/14/2019 0425 02/19/2019 0604       Assessment/Plan Cervical dystonia ABLAnemia - hbg 9.8, mildly tachycardic Delirium -continues to have intermittent confusion.Minimize sedating/narcotic meds. Orient her to time of  day. Keep blinds open during the day. Protein Calorie Malnutrition- Pre-albumin 6.3, continue TPN Elevated Blood Pressure/tachycardia -BP stable this AM, tachycardia likely secondary to PNA.No history of HTN. 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 - Pathis benign with no evidence of colitis or malignant process - CT 5/10 showed stable ascites and LLL PNA - started on abx for this LLL PNA - IV abx, Tmax 100.9, tachycardic, WBC 33  FEN -NPO,IVF, TPN VTE -Lovenox, SCDs ID -Clinda/Gent periop; merrem/vanc 5/10>> Foley - DC 4/29 Follow up - Dwain Sarna POC - Husband Somia Maxin [phone] or [phone])  Plan: Aggressive IS and pulm toiletry, treat PNA. Mobilize as able and await return of bowel function. Continue TPN for nutrition. I will update husband later this AM.    LOS: 13 days    Wells Guiles , [facility] 03/18/2019, 8:29 AM Pager: [phone] Consults: [phone]

## 2019-03-18 NOTE — Progress Notes (Signed)
Notified for c/o SOB.  Dana [name] was awake, oriented to person, place and situation.  She complained of SOB and that her "throat hurt". Denies CP and abd pain. [place] was increased to 4.5L [place] by nursing staff with sats 93%. NAD. Minimal WOB. Tachynea improved from earlier, Skin color is improved, not diaphoretic and mental status improved.  NGT still draining.  Kristen RRT also at bedside.  Vital Signs MEWS/VS Documentation      03/17/2019 2359 03/18/2019 0000 03/18/2019 0100 03/18/2019 0327   MEWS Score:  3  3  2  4    MEWS Score Color:  Yellow  Yellow  Yellow  Red   Resp:  (!) 27  (!) 27  (!) 21  (!) 26   Pulse:  98  99  98  (!) 114   BP:  118/62  -  -  (!) 126/55   Temp:  98.9 F (37.2 C)  -  -  98.4 F (36.9 C)   O2 Device:  Nasal Cannula  Nasal Cannula  Nasal Cannula  Nasal Cannula   O2 Flow Rate (L/min):  4 L/min  4 L/min  4 L/min  5 L/min           Rose Fillers 03/18/2019,3:43 AM

## 2019-03-18 NOTE — Progress Notes (Signed)
Ms. Brannum's tachypnea improved. Afebrile, awake and responsive. Weaned to 3L [place] with sats 96%. Will continue to monitor.

## 2019-03-18 NOTE — Progress Notes (Addendum)
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 Pt with delirium post op. PMH - anxiety, depression, cervical dystonia, IBS, cervical spondylosis, osteoporosis, lt wrist fx, rt rotator cuff repair    PT Comments    Pt continues to have confusion/delirium. Pt requiring O2 at rest and with mobility. Unsure if family will be able to manage pt's confusion/delirium at home if is doesn't improve.  May need ST-SNF if doesn't improve.  Follow Up Recommendations  Home health PT;Supervision/Assistance - 24 hour(if family can manage her delirium/behavior)     Equipment Recommendations  Other (comment)(rollator)    Recommendations for Other Services       Precautions / Restrictions Precautions Precautions: Fall Restrictions Weight Bearing Restrictions: No    Mobility  Bed Mobility Overal bed mobility: Needs Assistance           General bed mobility comments: Pt up in chair  Transfers Overall transfer level: Needs assistance Equipment used: 4-wheeled walker Transfers: Sit to/from Stand Sit to Stand: Min assist         General transfer comment: Assist for balance  Ambulation/Gait Ambulation/Gait assistance: Min guard;+2 safety/equipment Gait Distance (Feet): 150 Feet Assistive device: 4-wheeled walker Gait Pattern/deviations: Step-through pattern;Decreased step length - right;Decreased step length - left;Narrow base of support;Trunk flexed Gait velocity: decr Gait velocity interpretation: 1.31 - 2.62 ft/sec, indicative of limited community ambulator General Gait Details: Assist for safety and balance. Pt amb on 4L of O2 with SpO2 90-92%   Stairs             Wheelchair Mobility    Modified Rankin (Stroke Patients Only)       Balance Overall balance assessment: Needs  assistance Sitting-balance support: No upper extremity supported;Feet supported Sitting balance-Leahy Scale: Good    Standing balance support: No upper extremity supported;During functional activity Standing balance-Leahy Scale: Fair Standing balance comment: static stand with close supervision                            Cognition Arousal/Alertness: Awake/alert Behavior During Therapy: Anxious Overall Cognitive Status: Impaired/Different from baseline Area of Impairment: Memory;Safety/judgement;Problem solving;Following commands;Awareness;Attention                 Orientation Level: Disoriented to;Time Current Attention Level: Sustained Memory: Decreased short-term memory Following Commands: Follows one step commands consistently;Follows one step commands with increased time Safety/Judgement: Decreased awareness of deficits Awareness: Emergent Problem Solving: Requires verbal cues General Comments: Pt stating her daughters are responsible for all of this. When daughter Alona Bene called on phone pt telling daughter that they are coming for her next and talking about guardianship. Reoriented pt to why she was in hospital.      Exercises      General Comments General comments (skin integrity, edema, etc.): SpO2 5L 87% [place]. Cued for purse lip breathing and she elevated to 89% on 6L HFNC.  HR 114 resting. HR 125 with activity. RR 30s during activity      Pertinent Vitals/Pain Pain Assessment: Faces Faces Pain Scale: Hurts little more Pain Location: abdomen Pain Descriptors / Indicators: Guarding;Sore Pain Intervention(s): Monitored during session    Home Living                      Prior Function  PT Goals (current goals can now be found in the care plan section) Acute Rehab PT Goals Patient Stated Goal: go home Progress towards PT goals: Progressing toward goals    Frequency    Min 3X/week      PT Plan Current plan remains  appropriate    Co-evaluation              AM-PAC PT "6 Clicks" Mobility   Outcome Measure  Help needed turning from your back to your side while in a flat bed without using bedrails?: None Help needed moving from lying on your back to sitting on the side of a flat bed without using bedrails?: A Little Help needed moving to and from a bed to a chair (including a wheelchair)?: A Little Help needed standing up from a chair using your arms (e.g., wheelchair or bedside chair)?: A Little Help needed to walk in hospital room?: A Little Help needed climbing 3-5 steps with a railing? : A Lot 6 Click Score: 18    End of Session Equipment Utilized During Treatment: Oxygen Activity Tolerance: Patient limited by fatigue Patient left: in chair;with call bell/phone within reach;with chair alarm set Nurse Communication: Mobility status PT Visit Diagnosis: Unsteadiness on feet (R26.81);Muscle weakness (generalized) (M62.81)     Time: [id number] PT Time Calculation (min) (ACUTE ONLY): 18 min  Charges:  $Gait Training: 8-22 mins                     [facility] Center[facility] Joyia Riehle PT Acute Rehabilitation Services Pager [phone] Office [phone]    Angelina OkCary W [facility] 03/18/2019, 12:18 PM

## 2019-03-18 NOTE — Clinical Social Work Note (Signed)
APS report was made to [employer] with [facility] APS. Pt is aware report was made and is appreciative.  [place], [place] [phone]

## 2019-03-18 NOTE — Care Management Important Message (Signed)
Important Message  Patient Details  Name: Dana [name] MRN: [id number] Date of Birth: Dec 16, 1945   Medicare Important Message Given:  Yes    Leone Haven, RN 03/18/2019, 4:01 PM

## 2019-03-18 NOTE — Progress Notes (Signed)
Nutrition Follow-up  DOCUMENTATION CODES:   Underweight  INTERVENTION:   Monitor magnesium, potassium, and phosphorus daily for at least 3 days, MD to replete as needed, as pt is at risk for refeeding syndrome given underweight status, poor PO intakes x 14 days.  TPN per Pharmacy  NUTRITION DIAGNOSIS:   Inadequate oral intake related to inability to eat(ileus) as evidenced by NPO status.  Ongoing.  GOAL:   Patient will meet greater than or equal to 90% of their needs  Progressing with TPN.  MONITOR:   Diet advancement, Labs, Weight trends, I & O's(TPN)  REASON FOR ASSESSMENT:   Consult New TPN/TNA  ASSESSMENT:   44 yof with history of cervical dystonia and "colitis" for several years that she has lost fair amount of weight. She has alternating diarrhea and constipation.  Admitted for cecal volvulus.  4/28- admitted for cecal volvulus, s/p Open right hemicolectomy 4/29- diet advanced to Clear liquids 4/30- clear liquids to full liquids 5/1- NPO to full liquids 5/3 - soft diet 5/4- pt made NPO following episodes of N/V 5/5 - abdominal x-ray revealed post-op ileus 5/7 CT negative for intra-abdominal abscess, restarted on clears 5/8- diet advanced to full liquids 5/10- present with abdominal distention, NGT placed, Rapid response for vomiting while lying flat for CT, surgery suspects pt has been aspirating. PICC placed and TPN initiated at 30 ml/hr.   **RD working remotely**  Patient receiving TPN at 30 ml/hr via PICC. Per Pharmacy note, to advance to goal rate of 60 ml/hr today (providing 1535 kcal, 79g protein). NGT is still in place, output x 24 hours: 400 ml.  Pt's weight is trending down from 5/8 (100 lb).  Medications: IV Mg sulfate Labs reviewed: CBGs: 125 Low Na Mg/Phos WNL  Diet Order:   Diet Order            Diet NPO time specified  Diet effective now              EDUCATION NEEDS:   No education needs have been identified at this  time  Skin:  Skin Assessment: Reviewed RN Assessment  Last BM:  5/9  Height:   Ht Readings from Last 1 Encounters:  02/18/2019 5\' 4"  (1.626 m)    Weight:   Wt Readings from Last 1 Encounters:  03/17/19 43.5 kg    Ideal Body Weight:  54.5 kg  BMI:  Body mass index is 16.46 kg/m.  Estimated Nutritional Needs:   Kcal:  1400-1600  Protein:  70-80g  Fluid:  1.6L/day  Tilda Franco, MS, RD, LDN [facility] Inpatient Clinical Dietitian Pager: [phone] After Hours Pager: [phone]

## 2019-03-19 ENCOUNTER — Inpatient Hospital Stay (HOSPITAL_COMMUNITY): Payer: Medicare Other

## 2019-03-19 DIAGNOSIS — J9601 Acute respiratory failure with hypoxia: Secondary | ICD-10-CM

## 2019-03-19 DIAGNOSIS — G243 Spasmodic torticollis: Secondary | ICD-10-CM | POA: Diagnosis present

## 2019-03-19 LAB — BASIC METABOLIC PANEL
Anion gap: 5 (ref 5–15)
Anion gap: 9 (ref 5–15)
BUN: 15 mg/dL (ref 8–23)
BUN: 15 mg/dL (ref 8–23)
CO2: 27 mmol/L (ref 22–32)
CO2: 25 mmol/L (ref 22–32)
Calcium: 8.1 mg/dL — ABNORMAL LOW (ref 8.9–10.3)
Calcium: 8.3 mg/dL — ABNORMAL LOW (ref 8.9–10.3)
Chloride: 96 mmol/L — ABNORMAL LOW (ref 98–111)
Chloride: 97 mmol/L — ABNORMAL LOW (ref 98–111)
Creatinine, Ser: 0.56 mg/dL (ref 0.44–1.00)
Creatinine, Ser: 0.32 mg/dL — ABNORMAL LOW (ref 0.44–1.00)
GFR calc Af Amer: >60 mL/min (ref >60)
GFR calc Af Amer: >60 mL/min (ref >60)
GFR calc non Af Amer: >60 mL/min (ref >60)
GFR calc non Af Amer: >60 mL/min (ref >60)
Glucose, Bld: 476 mg/dL — ABNORMAL HIGH (ref 70–99)
Glucose, Bld: 131 mg/dL — ABNORMAL HIGH (ref 70–99)
Potassium: 5.9 mmol/L — ABNORMAL HIGH (ref 3.5–5.1)
Potassium: 4 mmol/L (ref 3.5–5.1)
Sodium: 128 mmol/L — ABNORMAL LOW (ref 135–145)
Sodium: 131 mmol/L — ABNORMAL LOW (ref 135–145)

## 2019-03-19 LAB — CBC
HCT: 27 % — ABNORMAL LOW (ref 36.0–46.0)
Hemoglobin: 9.2 g/dL — ABNORMAL LOW (ref 12.0–15.0)
MCH: 32.9 pg (ref 26.0–34.0)
MCHC: 34.1 g/dL (ref 30.0–36.0)
MCV: 96.4 fL (ref 80.0–100.0)
Platelets: 480 10*3/uL — ABNORMAL HIGH (ref 150–400)
RBC: 2.8 MIL/uL — ABNORMAL LOW (ref 3.87–5.11)
RDW: 15.5 % (ref 11.5–15.5)
WBC: 42.1 10*3/uL — ABNORMAL HIGH (ref 4.0–10.5)
nRBC: 0 % (ref 0.0–0.2)

## 2019-03-19 LAB — BLOOD GAS, ARTERIAL
Acid-Base Excess: 6.3 mmol/L — ABNORMAL HIGH (ref 0.0–2.0)
Bicarbonate: 29.9 mmol/L — ABNORMAL HIGH (ref 20.0–28.0)
Drawn by: 33176
O2 Content: 6 L/min
O2 Saturation: 96.9 %
Patient temperature: 98.6
pCO2 arterial: 40.5 mmHg (ref 32.0–48.0)
pH, Arterial: 7.482 — ABNORMAL HIGH (ref 7.350–7.450)
pO2, Arterial: 83 mmHg (ref 83.0–108.0)

## 2019-03-19 LAB — GLUCOSE, CAPILLARY
Glucose-Capillary: 123 mg/dL — ABNORMAL HIGH (ref 70–99)
Glucose-Capillary: 126 mg/dL — ABNORMAL HIGH (ref 70–99)
Glucose-Capillary: 149 mg/dL — ABNORMAL HIGH (ref 70–99)
Glucose-Capillary: 157 mg/dL — ABNORMAL HIGH (ref 70–99)

## 2019-03-19 LAB — PHOSPHORUS
Phosphorus: 3.7 mg/dL (ref 2.5–4.6)
Phosphorus: 2.3 mg/dL — ABNORMAL LOW (ref 2.5–4.6)

## 2019-03-19 LAB — MAGNESIUM
Magnesium: 2.1 mg/dL (ref 1.7–2.4)
Magnesium: 1.7 mg/dL (ref 1.7–2.4)

## 2019-03-19 MED ORDER — TRAVASOL 10 % IV SOLN
INTRAVENOUS | Status: DC
  Administered 2019-03-19: 17:00:00 via INTRAVENOUS
  Filled 2019-03-19: qty 799.2

## 2019-03-19 MED ORDER — ESCITALOPRAM OXALATE 10 MG PO TABS
20.0000 mg | ORAL_TABLET | Freq: Two times a day (BID) | ORAL | Status: DC
  Administered 2019-03-20: 09:00:00 20 mg via ORAL
  Filled 2019-03-19: qty 1
  Filled 2019-03-19: qty 2
  Filled 2019-03-19: qty 1

## 2019-03-19 MED ORDER — SODIUM PHOSPHATES 45 MMOLE/15ML IV SOLN
10.0000 mmol | Freq: Once | INTRAVENOUS | Status: AC
  Administered 2019-03-19: 12:00:00 10 mmol via INTRAVENOUS
  Filled 2019-03-19: qty 3.33

## 2019-03-19 MED ORDER — BUPROPION HCL ER (XL) 150 MG PO TB24
150.0000 mg | ORAL_TABLET | Freq: Every day | ORAL | Status: DC
  Administered 2019-03-19 – 2019-03-20 (×2): 150 mg via ORAL
  Filled 2019-03-19 (×3): qty 1

## 2019-03-19 MED ORDER — MAGNESIUM SULFATE 2 GM/50ML IV SOLN
2.0000 g | Freq: Once | INTRAVENOUS | Status: AC
  Administered 2019-03-19: 11:00:00 2 g via INTRAVENOUS
  Filled 2019-03-19: qty 50

## 2019-03-19 NOTE — Progress Notes (Signed)
[facility] Progress Note  14 Days Post-Op  Subjective: CC: increasing oxygen requirement Patient with increased hypoxia overnight and reportedly desatting into the 70s at times. Now in the 90-95% on 6L via high flow nasal cannula. Patient is still convinced that we are keeping her here against her will and that her family is conspiring against her.   Denies abdominal pain outside of coughing. Denies nausea, had 3 BM yesterday per RN.   Objective: Vital signs in last 24 hours: Temp:  [97.9 F (36.6 C)-98.6 F (37 C)] 98.3 F (36.8 C) (05/12 0000) Pulse Rate:  [100-117] 113 (05/12 0405) Resp:  [19-27] 25 (05/12 0405) BP: (131-164)/(70-84) 152/76 (05/12 0405) SpO2:  [89 %-97 %] 94 % (05/12 0405) Weight:  [43.9 kg] 43.9 kg (05/12 0411) Last BM Date: 03/18/19  Intake/Output from previous day: 05/11 0701 - 05/12 0700 In: 880.6 [I.V.:430.4; IV Piggyback:450.2] Out: 1190 [Urine:775; Emesis/NG output:415] Intake/Output this shift: No intake/output data recorded.  PE: Gen: Alert, NADbut does not appear to feel well Pulm: on 6L via HFNC with O2 sat 95%, some rales in L lower lung fields Cardio: sinus tachycardia Abd: Soft, hypoactive BS, mild distention, non-tender, open midline incision c/d/i with proximal aspect of wound closing and distal portion with beefy red tissue and no erythema or drainage Skin: warm and dry Psych: Alert and oriented to person, place, and time; some TD like movements of neck   Lab Results:  Recent Labs    03/18/19 0325 03/19/19 0647  WBC 33.0* 42.1*  HGB 9.8* 9.2*  HCT 28.3* 27.0*  PLT 528* 480*   BMET Recent Labs    03/18/19 0325 03/19/19 0647  NA 127* 128*  K 3.6 5.9*  CL 89* 96*  CO2 28 27  GLUCOSE 123* 476*  BUN 15 15  CREATININE 0.47 0.56  CALCIUM 8.3* 8.1*   PT/INR No results for input(s): LABPROT, INR in the last 72 hours. CMP     Component Value Date/Time   NA 128 (L) 03/19/2019 0647   K 5.9 (H) 03/19/2019  0647   CL 96 (L) 03/19/2019 0647   CO2 27 03/19/2019 0647   GLUCOSE 476 (H) 03/19/2019 0647   BUN 15 03/19/2019 0647   CREATININE 0.56 03/19/2019 0647   CALCIUM 8.1 (L) 03/19/2019 0647   PROT 4.9 (L) 03/18/2019 0325   ALBUMIN 2.3 (L) 03/18/2019 0325   AST 20 03/18/2019 0325   ALT 28 03/18/2019 0325   ALKPHOS 76 03/18/2019 0325   BILITOT 0.9 03/18/2019 0325   GFRNONAA >60 03/19/2019 0647   GFRAA >60 03/19/2019 0647   Lipase     Component Value Date/Time   LIPASE 32 09-20-2019 2330       Studies/Results: Dg Chest 1 View  Result Date: 03/18/2019 CLINICAL DATA:  73 year old female with vomiting and possible aspiration. EXAM: CHEST  1 VIEW COMPARISON:  Chest radiograph dated 03/17/2019 FINDINGS: Left-sided PICC with tip over central SVC. Enteric tube extends into the left hemiabdomen with tip beyond the inferior margin of the image. There is a background of emphysema. Left lung base densities may represent atelectasis or pneumonia. A small left pleural effusion may be present. Overall the appearance of the left lung base is similar to the study of 03/17/2019. Minimal right lung base atelectatic changes noted. There is no pneumothorax. Stable cardiac silhouette. No acute osseous pathology. IMPRESSION: 1. Left-sided PICC with tip over central SVC. 2. Left lung base airspace density as seen on the study 03/17/2019. Electronically Signed  By: Elgie Collard M.D.   On: 03/18/2019 04:00   Ct Abdomen Pelvis W Contrast  Result Date: 03/17/2019 CLINICAL DATA:  73 year old female with continued abdominal distension and discomfort following RIGHT colon and terminal ileum resection on 02/15/2019. EXAM: CT ABDOMEN AND PELVIS WITH CONTRAST TECHNIQUE: Multidetector CT imaging of the abdomen and pelvis was performed using the standard protocol following bolus administration of intravenous contrast. CONTRAST:  OMNIPAQUE IOHEXOL 300 MG/ML  SOLN COMPARISON:  03/13/2019 prior CTs FINDINGS: Lower  chest: New LEFT LOWER lobe airspace disease/consolidation likely represents pneumonia or aspiration. Small bilateral pleural effusions are present. Hepatobiliary: Liver is unchanged. Patient is status post cholecystectomy. No biliary dilatation. Pancreas: Unremarkable Spleen: Unremarkable Adrenals/Urinary Tract: The kidneys, bladder and adrenal glands are unremarkable. Stomach/Bowel: RIGHT colon/terminal ileum resection changes noted. Majority of the visualized small bowel loops are dilated and slightly increased in caliber since 03/13/2019. There is a suggestion of some distal small bowel loops that are not distended,, suspicious for a small bowel obstruction. There is no evidence of pneumoperitoneum or discrete abscess. An NG tube is present with tip in the mid-distal stomach. Vascular/Lymphatic: The visualized mesenteric arteries appear patent. Aortic atherosclerosis. No enlarged abdominal or pelvic lymph nodes. Reproductive: No significant abnormalities Other: Moderate to large amount and pelvis of ascites within the abdomen noted. Musculoskeletal: No acute or suspicious bony abnormalities. IMPRESSION: 1. New LEFT LOWER lobe airspace disease/consolidation likely representing pneumonia or aspiration. 2. Slightly increasing multiple dilated small bowel loop suspicious for bowel obstruction. No evidence of pneumoperitoneum. 3. Moderate to large amount of ascites and small bilateral pleural effusions. 4.  Aortic Atherosclerosis (ICD10-I70.0). Electronically Signed   By: Harmon Pier M.D.   On: 03/17/2019 12:06   Dg Chest Port 1 View  Result Date: 03/17/2019 CLINICAL DATA:  Aspiration EXAM: PORTABLE CHEST 1 VIEW COMPARISON:  03/12/2000 FINDINGS: LEFT LOWER lung airspace opacities likely represent aspiration or pneumonia. Cardiomediastinal silhouette is unchanged. An NG tube enters the stomach with tip off the field of view. Trace bilateral pleural effusions noted. No acute bony abnormality. IMPRESSION: LEFT LOWER  lung airspace disease compatible with aspiration or pneumonia. Trace bilateral pleural effusions. Electronically Signed   By: Harmon Pier M.D.   On: 03/17/2019 12:46   Dg Abd Portable 1v  Result Date: 03/17/2019 CLINICAL DATA:  Abdominal distension and pain. EXAM: PORTABLE ABDOMEN - 1 VIEW COMPARISON:  CT scan of Mar 13, 2019.  Radiograph of Mar 12, 2019. FINDINGS: Severe small bowel dilatation is noted concerning for distal small bowel obstruction. No colonic dilatation is noted. Status post cholecystectomy. No abnormal calcifications are noted. No colonic dilatation is noted. IMPRESSION: Severe small bowel dilatation is noted concerning for distal small bowel obstruction. Electronically Signed   By: Lupita Raider M.D.   On: 03/17/2019 09:34   [place] Ekg Site Rite  Result Date: 03/17/2019 If Site Rite image not attached, placement could not be confirmed due to current cardiac rhythm.   Anti-infectives: Anti-infectives (From admission, onward)   Start     Dose/Rate Route Frequency Ordered Stop   03/18/19 0400  meropenem (MERREM) 1 g in sodium chloride 0.9 % 100 mL IVPB     1 g 200 mL/hr over 30 Minutes Intravenous Every 12 hours 03/17/19 1421     03/17/19 1430  meropenem (MERREM) 1 g in sodium chloride 0.9 % 100 mL IVPB     1 g 200 mL/hr over 30 Minutes Intravenous  Once 03/17/19 1421 03/17/19 1607   03/17/19 1400  vancomycin (VANCOCIN) IVPB 1000 mg/200 mL premix     1,000 mg 200 mL/hr over 60 Minutes Intravenous Every 36 hours 03/17/19 1259     02/21/2019 0515  clindamycin (CLEOCIN) IVPB 900 mg     900 mg 100 mL/hr over 30 Minutes Intravenous To Surgery 02/20/2019 0425 02/12/2019 0531   02/19/2019 0515  gentamicin (GARAMYCIN) 210 mg in dextrose 5 % 100 mL IVPB     5 mg/kg  42.2 kg 105.3 mL/hr over 60 Minutes Intravenous STAT 02/18/2019 0425 02/16/2019 0604       Assessment/Plan Cervical dystonia ABLAnemia - hbg9.2,mildly tachycardic Delirium -continues to have intermittent  confusion.Minimize sedating/narcotic meds. Orient her to time of day. Keep blinds open during the day. Protein Calorie Malnutrition- Pre-albumin 6.3, continue TPN Elevated Blood Pressure/tachycardia-BPstable this AM, tachycardia likely secondary to PNA.No history of HTN. 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 - Pathis benign with no evidence of colitis or malignant process - CT 5/10 showed stable ascites and LLL PNA - started on abx for this - had 3 BM yesterday - ok to keep NGT clamped and try some clears today, if patient becomes nauseated reconnect NGT to LIWS LLL PNA - IV abx, afebrile, tachycardic, WBC 42 - increasing oxygen requirement, up to 6L on high flow nasal cannula - consulted CCM and will transfer to ICU   FEN -ok to try some clears with NGT clamped,IVF, TPN VTE -Lovenox, SCDs ID -Clinda/Gent periop; merrem/vanc 5/10>> Foley - DC 4/29 Follow up - Dwain Sarna POC - Husband Greggory Stallion [phone] or [phone])  Plan:Patient continues to be confused and states that people are out to get her and trying to kill her. I would like to try to avoid any sedating medications as able. Discontinue phenergan with possible TD.   Patient requiring increased oxygen since yesterday and WBC continues to increase. Check CXR and ABG. I have consulted CCM and will transfer to ICU for closer monitoring.   From a surgical standpoint ileus seems to be improving, but I would leave NGT in place and clamped considering prolonged ileus and aspiration 2 days ago. Ok to try some clears as long as patient is alert enough to take. Wound healing well and dressing was changed this AM already.   I will call patient's husband and update him later this morning.   LOS: 14 days    Wells Guiles , [facility] 03/19/2019, 8:00  AM Pager: [phone] Consults: [phone]

## 2019-03-19 NOTE — Consult Note (Addendum)
NAME:  Dana [name], MRN:  [id number], DOB:  1946/04/29, LOS: 14 ADMISSION DATE:  02/19/2019, CONSULTATION DATE:  5/12 REFERRING MD:  CCS Dwain Sarna, CHIEF COMPLAINT:  Hypoxia   Brief History   73 year old female admitted with cecal volvulus s/p colectomy. Slow to progress due to ileus. Suspected aspiration with increasing O2 demands.   History of present illness   73 year old female with PMH as below, which is significant for cervical dystonia, colitis, and pericarditis. She was admitted to [facility] 4/28 with cecal volvulus and was taken to OR for urgent colectomy. She was able to be closed post-operatively without need for ostomy. Post operative course complicated by slow progression and inability to reliably take POs. She ultimately required TPN. 5/10 she went for abdominal CT and vomited while in the scanner with concern for aspiration. She was started on meropenem and vancomycin. Despite ABX therapy her oxygen requirements worsened in the following days. She was escalated to high flow nasal cannula and PCCM was consulted 5/12.  Past Medical History   has a past medical history of Anxiety, Cervical dystonia, Closed fracture of left distal radius, Colitis, DDD (degenerative disc disease), cervical, Depression, History of pericarditis, IBS (irritable bowel syndrome), MVP (mitral valve prolapse), Osteoporosis, Spondylosis of cervical spine, Torticollis, spasmodic, Urinary incontinence, and Wears glasses.   Significant Hospital Events   4/28 admit, to OR for colectomy.  5/10 TPN, aspiration event 5/12 hypoxia, PCCM consult.   Consults:    Procedures:  4/28 > Right colectomy.   Significant Diagnostic Tests:  4/28 CT abdomen > Positive for Cecal Volvulus with severely dilated cecum in the midline. The ascending and downstream colon is completely decompressed. Small volume of free fluid in the abdomen and pelvis. No free air. New intra- and extrahepatic biliary ductal dilatation in the  setting of chronic cholecystectomy. 4/30 CT abdomen > Status post interval right colectomy. Moderate volume four quadrant ascites with intermediate attenuation, HU = 42, increased compared to prior examination and concerning for hemoperitoneum given drop in hematocrit. Postoperative pneumoperitoneum. 5/6 CT abdomen > Prior RIGHT hemicolectomy. Diffusely dilated small bowel loops throughout abdomen and pelvis, favoring ileus in the immediate postoperative period. 5/10 CT abdomen > New LEFT LOWER lobe airspace disease/consolidation likely representing pneumonia or aspiration. Slightly increasing multiple dilated small bowel loop suspicious for bowel obstruction. No evidence of pneumoperitoneum. Moderate to large amount of ascites and small bilateral pleural effusions.  Micro Data:  Blood 5/12 > Sputum 5/12 >  Antimicrobials:  Meropenem 5/10 > Vancomycin 5/10 >  Interim history/subjective:    Objective   Blood pressure (!) 155/81, pulse (!) 111, temperature 99.3 F (37.4 C), temperature source Oral, resp. rate (!) 23, height  (1.626 m), weight 43.9 kg, SpO2 97 %.        Intake/Output Summary (Last 24 hours) at 03/19/2019 0901 Last data filed at 03/19/2019 0500 Gross per 24 hour  Intake 880.64 ml  Output 1190 ml  Net -309.36 ml   Filed Weights   03/17/19 0400 03/17/19 1033 03/19/19 0411  Weight: 44.1 kg 43.5 kg 43.9 kg    Examination: General: frail elderly female in NAD HENT: [place]/AT, PERRL, no JVD. Head and neck twitching.  Lungs: Diminished bases. No increased WOB.  Cardiovascular: RRR, no MRG Abdomen: Longitudinal surgical dressing in place. CDI Extremities: no acute deformity or ROM limitation Neuro: Alert, oriented, non-focal GU: Foley for I&O  Resolved Hospital Problem list     Assessment & Plan:   Acute  hypoxemic respiratory failure: Likely multifactorial. Reported aspiration event with marked WBC elevation. Has been on meropenem and vanco since 5/10. Pneumonia  is felt to be predominant factor. She also has recent abdominal surgery and overall weakness. She is at risk for atelectasis. Chemistry from this morning also consistent with volume overload, however, if I&O is accurate she is 3L negative for the admission.  Plan: - Supplemental O2 as indicated to keep SpO2 > 92%. - Not a good candidate for BiPAP should she worsen. - Continue empiric antibiotics and await culture data.  - Will evaluate with bedside ultrasound in attempt to determine pulmonary edema vs consolidation.  - Pain control to facilitate deep breathing and cough.  - Incentive spirometry   Electrolyte abnormalities: Hyponatremia, Hyperkalemia.  - Suspect both could be amenable to diuresis.   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 Currently on TPN since 5/10.  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  - supportive care  Best practice:  Diet: Per CCS. Currently clear liquid with TPN Pain/Anxiety/Delirium protocol (if indicated): n/a VAP protocol (if indicated): n/a DVT prophylaxis: Lovenox GI prophylaxis: N/a Glucose control: N/a Mobility: Activity as tolerated Code Status: FULL Family Communication:  Disposition: To ICU  Labs   CBC: Recent Labs  Lab 03/14/19 0232 03/15/19 0401 03/17/19 0538 03/18/19 0325 03/19/19 0647  WBC 12.2* 11.0* 30.6* 33.0* 42.1*  NEUTROABS  --   --   --  30.9*  --   HGB 10.3* 10.5* 11.5* 9.8* 9.2*  HCT 30.8* 30.6* 33.8* 28.3* 27.0*  MCV 94.2 94.7 94.2 92.2 96.4  PLT 585* 540* 663* 528* 480*    Basic Metabolic Panel: Recent Labs  Lab 03/12/19 0949  03/14/19 0232 03/15/19 0401 03/17/19 0538 03/18/19 0325 03/19/19 0647  NA 136   < > 134* 133* 128* 127* 128*  K 3.5   < > 3.5 3.3* 3.7 3.6 5.9*  CL 99   < > 96* 97* 85* 89* 96*  CO2 27   < > GLUCOSE 116*   < > 85 126* 142*  123* 476*  BUN 14   < > CREATININE 0.53   < > 0.65 0.44 0.47 0.47 0.56  CALCIUM 8.4*   < > 8.8* 8.5* 8.7* 8.3* 8.1*  MG 1.8  --   --  1.5* 1.5* 1.7 2.1  PHOS  --   --   --   --  2.8 3.4 3.7   < > = values in this interval not displayed.   GFR: Estimated Creatinine Clearance: 42.8 mL/min (by C-G formula based on SCr of 0.56 mg/dL). Recent Labs  Lab 03/15/19 0401 03/17/19 0538 03/18/19 0325 03/19/19 0647  WBC 11.0* 30.6* 33.0* 42.1*    Liver Function Tests: Recent Labs  Lab 03/18/19 0325  AST 20  ALT 28  ALKPHOS 76  BILITOT 0.9  PROT 4.9*  ALBUMIN 2.3*   No results for input(s): LIPASE, AMYLASE in the last 168 hours. No results for input(s): AMMONIA in the last 168 hours.  ABG    Component Value Date/Time   PHART 7.482 (H) 03/19/2019 0845   PCO2ART 40.5 03/19/2019 0845   PO2ART 83.0 03/19/2019 0845   HCO3 29.9 (H) 03/19/2019 0845   O2SAT 96.9 03/19/2019 0845     Coagulation Profile: No results for input(s): INR, PROTIME in  the last 168 hours.  Cardiac Enzymes: Recent Labs  Lab 03/18/19 0325 03/18/19 1100 03/18/19 1835  TROPONINI 0.04* 0.04* 0.04*    HbA1C: No results found for: HGBA1C  CBG: Recent Labs  Lab 03/18/19 0531 03/18/19 1222 03/18/19 1813 03/19/19 0003 03/19/19 0529  GLUCAP 125* 155* 142* 149* 157*    Review of Systems:   Bolds are positive  Constitutional: weight loss, gain, night sweats, Fevers, chills, fatigue .  HEENT: headaches, Sore throat, sneezing, nasal congestion, post nasal drip, Difficulty swallowing, Tooth/dental problems, visual complaints visual changes, ear ache CV:  chest pain, radiates:,Orthopnea, PND, swelling in lower extremities, dizziness, palpitations, syncope.  GI  heartburn, indigestion, abdominal pain, nausea, vomiting, diarrhea, change in bowel habits, loss of appetite, bloody stools.  Resp: cough, productive:white chunks and occasional blood, dyspnea, chest pain, pleuritic.  Skin: rash or  itching or icterus GU: dysuria, change in color of urine, urgency or frequency. flank pain, hematuria  MS: joint pain or swelling. decreased range of motion  Psych: change in mood or affect. depression or anxiety.  Neuro: difficulty with speech, weakness, numbness, ataxia    Past Medical History  She,  has a past medical history of Anxiety, Cervical dystonia, Closed fracture of left distal radius, Colitis, DDD (degenerative disc disease), cervical, Depression, History of pericarditis, IBS (irritable bowel syndrome), MVP (mitral valve prolapse), Osteoporosis, Spondylosis of cervical spine, Torticollis, spasmodic, Urinary incontinence, and Wears glasses.   Surgical History    Past Surgical History:  Procedure Laterality Date  . CARDIAC CATHETERIZATION  04-05-2000  dr al little   normal LVF/  ef 60%/  no coronary disaease/  MVP with mild MR  . CATARACT EXTRACTION W/ INTRAOCULAR LENS  IMPLANT, BILATERAL Bilateral   . CATARACT EXTRACTION W/ INTRAOCULAR LENS IMPLANT Bilateral   . CLOSED REDUCTION RADIAL SHAFT Left 11/27/2014   Procedure: CLOSED REDUCTION DISTAL RADIUS WITH PINS;  Surgeon: [name], MD;  Location: [facility];  Service: Orthopedics;  Laterality: Left;  . COLONOSCOPY W/ POLYPECTOMY  03-30-2001  . COLONOSCOPY WITH PROPOFOL N/A 01/06/2017   Procedure: COLONOSCOPY WITH PROPOFOL;  Surgeon: Jeani Hawking, MD;  Location: [facility];  Service: Endoscopy;  Laterality: N/A;  . DILATION AND CURETTAGE OF UTERUS  yrs ago  . ESOPHAGOGASTRODUODENOSCOPY (EGD) WITH PROPOFOL N/A 01/06/2017   Procedure: ESOPHAGOGASTRODUODENOSCOPY (EGD) WITH PROPOFOL;  Surgeon: Jeani Hawking, MD;  Location: [facility];  Service: Endoscopy;  Laterality: N/A;  . LAPAROSCOPIC CHOLECYSTECTOMY  04-27-2000  . LAPAROTOMY N/A 03/06/2019   Procedure: EXPLORATORY LAPAROTOMY,  RIGHT COLECTOMY;  Surgeon: Emelia Loron, MD;  Location: [facility] OR;  Service: General;  Laterality: N/A;  . PREAURICULAR CYST  EXCISION Left 04/20/2018   Procedure: EXCISION PREAURICULAR CYST PEDIATRIC;  Surgeon: Osborn Coho, MD;  Location: [facility] OR;  Service: ENT;  Laterality: Left;  Also [id number]  . ROTATOR CUFF REPAIR Right 11/ 2000     Social History   reports that she quit smoking about 30 years ago. Her smoking use included cigarettes. She has a 30.00 pack-year smoking history. She has never used smokeless tobacco. She reports that she does not drink alcohol or use drugs.   Family History   Her family history is not on file.   Allergies Allergies  Allergen Reactions  . Penicillins Rash    PATIENT HAS HAD A PCN REACTION WITH IMMEDIATE RASH, FACIAL/TONGUE/THROAT SWELLING, SOB, OR LIGHTHEADEDNESS WITH HYPOTENSION:  #  #  YES  #  #  Has patient had a PCN reaction causing severe rash  involving mucus membranes or skin necrosis: NO Has patient had a PCN reaction that required hospitalization NO Has patient had a PCN reaction occurring within the last 10 years: NO  . Sinemet [Carbidopa-Levodopa] Other (See Comments)    Reports lips/fingertips swelling, red face.  . Hydrocodone Itching  . Lactose Intolerance (Gi) Diarrhea    ANY MILK PRODUCTS  . Reclast [Zoledronic Acid] Rash    Rash   . Sulfa Antibiotics Rash     Home Medications  Prior to Admission medications   Medication Sig Start Date End Date Taking? Authorizing Provider  Ascorbic Acid (VITAMIN C) 1000 MG tablet Take 1,000 mg by mouth daily.    Yes [provider]  aspirin EC 81 MG tablet Take 81 mg by mouth every 6 (six) hours as needed for mild pain.    Yes [provider]  bismuth subsalicylate (PEPTO BISMOL) 262 MG/15ML suspension Take 30 mLs by mouth as needed for indigestion or diarrhea or loose stools.    Yes [provider]  buPROPion (WELLBUTRIN XL) 150 MG 24 hr tablet Take 150 mg by mouth daily. 03/22/18  Yes [provider]  Cholecalciferol (VITAMIN D3) 2000 units TABS Take 6,000 Units by mouth daily.   Yes  [provider]  clonazePAM (KLONOPIN) 1 MG tablet Take 1 mg by mouth 3 (three) times daily.    Yes [provider]  COLLAGEN PO Take 3 capsules by mouth 2 (two) times daily. In the morning & in the afternoon.   Yes [provider]  escitalopram (LEXAPRO) 20 MG tablet Take 20 mg by mouth 2 (two) times daily. IN THE MORNING & WITH LUNCH   Yes [provider]  Ginger, Zingiber officinalis, (GINGER ROOT) 550 MG CAPS Take 1,100 mg by mouth once a week.    Yes [provider]  Glucosamine-Chondroitin (COSAMIN DS PO) Take 3 tablets by mouth daily.   Yes [provider]  HYDROcodone-acetaminophen (NORCO) 5-325 MG tablet Take 1-2 tablets by mouth every 6 (six) hours as needed for moderate pain. 04/20/18  Yes Osborn CohoShoemaker, David, MD  Krill Oil 500 MG CAPS Take 500 mg by mouth daily.   Yes [provider]  modafinil (PROVIGIL) 200 MG tablet Take 100 mg by mouth 2 (two) times daily. 04/05/18  Yes [provider]  Multiple Vitamin (MULTIVITAMIN WITH MINERALS) TABS tablet Take 1 tablet by mouth daily. Women's Multivitamin   Yes [provider]  OnabotulinumtoxinA (BOTOX IJ) Inject 1 Dose as directed every 3 (three) months. Botox Injection for Dystonia [facility]   Yes [provider]  Polyethyl Glycol-Propyl Glycol (SYSTANE) 0.4-0.3 % SOLN Place 1 drop into both eyes 2 (two) times daily.   Yes [provider]  Specialty Vitamins Products (BRAIN) TABS Take 1 tablet by mouth 2 (two) times daily. Natrol Cognium 100 mg   Yes [provider]  Turmeric 500 MG CAPS Take 500 mg by mouth daily.   Yes [provider]  vitamin B-12 (CYANOCOBALAMIN) 1000 MCG tablet Take 1,000 mcg by mouth daily.   Yes [provider]     Critical care time:      Joneen RoachPaul Aldonia Keeven, AGACNP-BC [facility] Pulmonary/Critical Care Pager [phone] or [phone]  03/19/2019 9:43 AM    Update 1030  -  Bedside ultrasound with no clear evidence of pulmonary edema. Continue with plan as above.    Joneen RoachPaul Saadiya Wilfong, AGACNP-BC [facility]/Critical [facility] Pager [phone] or [phone]  03/19/2019 10:31 AM

## 2019-03-19 NOTE — Progress Notes (Addendum)
Physical Therapy Treatment Patient Details Name: Dana [name] MRN: [id number] DOB: Apr 29, 1946 Today's Date: 03/19/2019    History of Present Illness Pt s/p ex lap with right colectomy secondary to cecal volvulus on 4/28. Pt with delirium post op. PMH - anxiety, depression, cervical dystonia, IBS, cervical spondylosis, osteoporosis, lt wrist fx, rt rotator cuff repair    PT Comments    Pt remains weak. Will need assist at home for mobility and also for behavior/confusion. If family can't provide then may need to explore ST-SNF.    Follow Up Recommendations  Home health PT;Supervision/Assistance - 24 hour(if family can manage her delirium/behavior)     Equipment Recommendations  Other (comment)(rollator)    Recommendations for Other Services       Precautions / Restrictions Precautions Precautions: Fall Restrictions Weight Bearing Restrictions: No    Mobility  Bed Mobility Overal bed mobility: Needs Assistance Bed Mobility: Supine to Sit;Sit to Supine Rolling: Min guard Sidelying to sit: Min guard   Sit to supine: Min guard   General bed mobility comments: Assist for lines/safety  Transfers Overall transfer level: Needs assistance Equipment used: 4-wheeled walker Transfers: Sit to/from Stand Sit to Stand: Min assist         General transfer comment: Assist for balance  Ambulation/Gait Ambulation/Gait assistance: Min guard;+2 safety/equipment Gait Distance (Feet): 100 Feet Assistive device: 4-wheeled walker Gait Pattern/deviations: Step-through pattern;Decreased step length - right;Decreased step length - left;Narrow base of support;Trunk flexed Gait velocity: decr Gait velocity interpretation: 1.31 - 2.62 ft/sec, indicative of limited community ambulator General Gait Details: Assist for safety and balance. Pt amb on 6L of O2 with SpO2 88-92%. HR 125   Stairs             Wheelchair Mobility    Modified Rankin (Stroke Patients Only)        Balance Overall balance assessment: Needs assistance Sitting-balance support: No upper extremity supported;Feet supported Sitting balance-Leahy Scale: Fair     Standing balance support: During functional activity;Single extremity supported Standing balance-Leahy Scale: Poor Standing balance comment: UE support for static standing                            Cognition Arousal/Alertness: Awake/alert Behavior During Therapy: Anxious Overall Cognitive Status: Impaired/Different from baseline Area of Impairment: Memory;Safety/judgement;Problem solving;Following commands;Awareness;Attention                 Orientation Level: Disoriented to;Time Current Attention Level: Sustained Memory: Decreased short-term memory Following Commands: Follows one step commands consistently;Follows one step commands with increased time Safety/Judgement: Decreased awareness of deficits Awareness: Emergent Problem Solving: Requires verbal cues        Exercises      General Comments        Pertinent Vitals/Pain Pain Assessment: Faces Faces Pain Scale: Hurts little more Pain Location: abdomen Pain Descriptors / Indicators: Guarding;Sore Pain Intervention(s): Monitored during session    Home Living                      Prior Function            PT Goals (current goals can now be found in the care plan section) Progress towards PT goals: Not progressing toward goals - comment    Frequency    Min 3X/week      PT Plan Current plan remains appropriate    Co-evaluation  AM-PAC PT "6 Clicks" Mobility   Outcome Measure  Help needed turning from your back to your side while in a flat bed without using bedrails?: None Help needed moving from lying on your back to sitting on the side of a flat bed without using bedrails?: A Little Help needed moving to and from a bed to a chair (including a wheelchair)?: A Little Help needed standing up from a  chair using your arms (e.g., wheelchair or bedside chair)?: A Little Help needed to walk in hospital room?: A Little Help needed climbing 3-5 steps with a railing? : A Lot 6 Click Score: 18    End of Session Equipment Utilized During Treatment: Oxygen Activity Tolerance: Patient limited by fatigue Patient left: with call bell/phone within reach;in bed;with nursing/sitter in room Nurse Communication: Mobility status PT Visit Diagnosis: Unsteadiness on feet (R26.81);Muscle weakness (generalized) (M62.81)     Time: [id number] PT Time Calculation (min) (ACUTE ONLY): 24 min  Charges:  $Gait Training: 23-37 mins                     [facility] Breunna Nordmann PT Acute Rehabilitation Services Pager [phone] Office [phone]    Angelina OkCary [facility] 03/19/2019, 11:05 AM

## 2019-03-19 NOTE — Progress Notes (Signed)
CSW spoke with APS Investigator Jolyne Loa, who was hoping to speak with patient to ask her some questions about allegations made. CSW noting per chart review that patient has transferred from 2W to [facility] ICU. CSW provided Crystal with updated CSW information for the unit to follow up as the patient improves.   Blenda Nicely, [place] Clinical Social Worker [phone]

## 2019-03-19 NOTE — Progress Notes (Signed)
PHARMACY - ADULT TOTAL PARENTERAL NUTRITION CONSULT NOTE   Pharmacy Consult for TPN Indication: Prolonged Ileus  Patient Measurements: Height: 5\' 4"  (162.6 cm) Weight: 96 lb 12.5 oz (43.9 kg) IBW/kg (Calculated) : 54.7 TPN AdjBW (KG): 42.2 Body mass index is 16.61 kg/m. Usual Weight: 80-90 lbs per patient *Weight fluctuating this admission 83 lbs up to 98 lbs.   Assessment: 73 year old female with history of colitis and weight loss admitted with cecal volvulus s/p ex lap with right colectomy on 02/18/2019. CT scan on 5/6 was consistent with ileus which has persisted. On day #13 of admission with minimal intake and intermittently NPO due the N/V, pharmacy was consulted to start TPN for persistent ileus.   GI: Pre-albumin 9.3>>6.3. Abdomen tender, distended. NGT output 415 ml, now clamped. +Flatus. No nausea. LBM 5/11 x3 per RN. Sucralfate BID. Simethicone QID. Trial of clears today. Endo: No hx DM. CBG 140-150's on TPN at goal. Insulin requirements in the past 24 hours: 2 units Lytes: Na/Cl low (131/97) on D5-NS, K 4, Mg 1.7 (goal > 2 for ileus; will replace). Phos down 2.3 (will replace). Ca 9.7 (albumin 2.3).  Renal: SCr 0.56, BUN 15; UOP 0.48ml/kg/hr.  Pulm: 5L HFNC - increased hypoxia.  Cards: Hypertensive. ST, 110-115. On Lopressor.  Hepatobil: LFTs/Tbili wnl. TGs 39 Neuro: Confused. APAP scheduled q6h. PRN Morphine. Tenderness noted. Pain score 0. Wellbutrin, Lexapro, and Clonazepam for anxiety/depression.  ID: Merrem and Vancomycin 5/10 >> present. WBC elevated significantly at 42, afebrile.    TPN Access: PICC to be placed 5/10 per RN TPN start date: 03/17/19 Nutritional Goals (per RD recommendation on 5/6): KCal: 1400-1600 Protein: 70-80 kg Fluid: 1.6L/day  Goal TPN rate is 60 ml/hr (provides 80 g protein, 230 g dextrose, 43.2g lipids and 1535 kcals meeting 100% of patients needs)  Current Nutrition:  TPN Clears 5/12  Plan:  Continue TPN at 41mL/hr. This TPN provides 80  g protein, 230 g dextrose, 43.2g lipids and 1535 kcals meeting 100% of patients needs Electrolytes in TPN: Increase Na to 75 meq/L, other lytes standard; maximize Cl.  Add MVI to TPN- trace elements are on back order, will only place in TPN on Monday, Wednesday and Friday. Add sensitive SSI q6h and adjust as needed Give Magnesium 2g IV x1.  Give NaPhos 10 mmol IV x1.  Monitor TPN labs Will order BMET, Mag and Phos for Wed am F/U RD recommendations, resolution of ileus and ability to advance diet   Link Snuffer, PharmD, BCPS, BCCCP Clinical Pharmacist Please refer to [facility] for [facility] numbers 03/19/2019, 7:12 AM

## 2019-03-20 DIAGNOSIS — J690 Pneumonitis due to inhalation of food and vomit: Secondary | ICD-10-CM

## 2019-03-20 DIAGNOSIS — J189 Pneumonia, unspecified organism: Secondary | ICD-10-CM

## 2019-03-20 DIAGNOSIS — R410 Disorientation, unspecified: Secondary | ICD-10-CM

## 2019-03-20 DIAGNOSIS — Z789 Other specified health status: Secondary | ICD-10-CM

## 2019-03-20 LAB — GLUCOSE, CAPILLARY
Glucose-Capillary: 122 mg/dL — ABNORMAL HIGH (ref 70–99)
Glucose-Capillary: 131 mg/dL — ABNORMAL HIGH (ref 70–99)
Glucose-Capillary: 149 mg/dL — ABNORMAL HIGH (ref 70–99)

## 2019-03-20 LAB — BASIC METABOLIC PANEL
Anion gap: 8 (ref 5–15)
Anion gap: 10 (ref 5–15)
BUN: 13 mg/dL (ref 8–23)
BUN: 11 mg/dL (ref 8–23)
CO2: 31 mmol/L (ref 22–32)
CO2: 26 mmol/L (ref 22–32)
Calcium: 8.2 mg/dL — ABNORMAL LOW (ref 8.9–10.3)
Calcium: 8.2 mg/dL — ABNORMAL LOW (ref 8.9–10.3)
Chloride: 97 mmol/L — ABNORMAL LOW (ref 98–111)
Chloride: 97 mmol/L — ABNORMAL LOW (ref 98–111)
Creatinine, Ser: 0.33 mg/dL — ABNORMAL LOW (ref 0.44–1.00)
Creatinine, Ser: 0.33 mg/dL — ABNORMAL LOW (ref 0.44–1.00)
GFR calc Af Amer: >60 mL/min (ref >60)
GFR calc Af Amer: >60 mL/min (ref >60)
GFR calc non Af Amer: >60 mL/min (ref >60)
GFR calc non Af Amer: >60 mL/min (ref >60)
Glucose, Bld: 106 mg/dL — ABNORMAL HIGH (ref 70–99)
Glucose, Bld: 141 mg/dL — ABNORMAL HIGH (ref 70–99)
Potassium: 3.4 mmol/L — ABNORMAL LOW (ref 3.5–5.1)
Potassium: 3.5 mmol/L (ref 3.5–5.1)
Sodium: 136 mmol/L (ref 135–145)
Sodium: 133 mmol/L — ABNORMAL LOW (ref 135–145)

## 2019-03-20 LAB — CBC
HCT: 26.5 % — ABNORMAL LOW (ref 36.0–46.0)
Hemoglobin: 8.9 g/dL — ABNORMAL LOW (ref 12.0–15.0)
MCH: 32.4 pg (ref 26.0–34.0)
MCHC: 33.6 g/dL (ref 30.0–36.0)
MCV: 96.4 fL (ref 80.0–100.0)
Platelets: 430 10*3/uL — ABNORMAL HIGH (ref 150–400)
RBC: 2.75 MIL/uL — ABNORMAL LOW (ref 3.87–5.11)
RDW: 15.5 % (ref 11.5–15.5)
WBC: 28.8 10*3/uL — ABNORMAL HIGH (ref 4.0–10.5)
nRBC: 0 % (ref 0.0–0.2)

## 2019-03-20 LAB — MAGNESIUM
Magnesium: 1.6 mg/dL — ABNORMAL LOW (ref 1.7–2.4)
Magnesium: 1.5 mg/dL — ABNORMAL LOW (ref 1.7–2.4)

## 2019-03-20 LAB — PHOSPHORUS
Phosphorus: 2.7 mg/dL (ref 2.5–4.6)
Phosphorus: 2.6 mg/dL (ref 2.5–4.6)

## 2019-03-20 LAB — MRSA PCR SCREENING: MRSA by PCR: NEGATIVE

## 2019-03-20 MED ORDER — POTASSIUM PHOSPHATES 15 MMOLE/5ML IV SOLN
10.0000 mmol | Freq: Once | INTRAVENOUS | Status: DC
  Filled 2019-03-20: qty 3.33

## 2019-03-20 MED ORDER — AMLODIPINE BESYLATE 5 MG PO TABS
5.0000 mg | ORAL_TABLET | Freq: Every day | ORAL | Status: DC
  Administered 2019-03-20: 09:00:00 5 mg via ORAL
  Filled 2019-03-20: qty 1

## 2019-03-20 MED ORDER — POTASSIUM CHLORIDE 10 MEQ/50ML IV SOLN
10.0000 meq | INTRAVENOUS | Status: DC
  Administered 2019-03-20: 10 meq via INTRAVENOUS
  Filled 2019-03-20: qty 50

## 2019-03-20 MED ORDER — TRAVASOL 10 % IV SOLN
INTRAVENOUS | Status: AC
  Administered 2019-03-20: 19:00:00 via INTRAVENOUS
  Filled 2019-03-20: qty 799.2

## 2019-03-20 MED ORDER — POTASSIUM CHLORIDE 10 MEQ/50ML IV SOLN
10.0000 meq | INTRAVENOUS | Status: AC
  Administered 2019-03-20 (×2): 10 meq via INTRAVENOUS
  Filled 2019-03-20 (×2): qty 50

## 2019-03-20 MED ORDER — TRAVASOL 10 % IV SOLN
INTRAVENOUS | Status: AC
  Filled 2019-03-20: qty 799.2

## 2019-03-20 MED ORDER — SODIUM CHLORIDE 0.9 % IV SOLN
2.0000 g | Freq: Two times a day (BID) | INTRAVENOUS | Status: DC
  Administered 2019-03-20 – 2019-03-21 (×2): 2 g via INTRAVENOUS
  Filled 2019-03-20 (×4): qty 2

## 2019-03-20 MED ORDER — CHLORHEXIDINE GLUCONATE CLOTH 2 % EX PADS
6.0000 | MEDICATED_PAD | Freq: Every day | CUTANEOUS | Status: DC
  Administered 2019-03-20 – 2019-03-21 (×2): 6 via TOPICAL

## 2019-03-20 MED ORDER — MAGNESIUM SULFATE 50 % IJ SOLN
3.0000 g | Freq: Once | INTRAVENOUS | Status: AC
  Administered 2019-03-20: 21:00:00 3 g via INTRAVENOUS
  Filled 2019-03-20 (×2): qty 6

## 2019-03-20 MED ORDER — POTASSIUM CHLORIDE 10 MEQ/50ML IV SOLN
10.0000 meq | INTRAVENOUS | Status: AC
  Administered 2019-03-20 – 2019-03-21 (×4): 10 meq via INTRAVENOUS
  Filled 2019-03-20 (×4): qty 50

## 2019-03-20 NOTE — Progress Notes (Signed)
NAME:  Dana [name], MRN:  [id number], DOB:  02/19/1946, LOS: 15 ADMISSION DATE:  02/18/2019, CONSULTATION DATE:  5/12 REFERRING MD:  CCS Dwain Sarna, CHIEF COMPLAINT:  Hypoxia   Brief History   73 year old female admitted with cecal volvulus s/p colectomy. Slow to progress due to ileus. Suspected aspiration with increasing O2 demands.   History of present illness   73 year old female with PMH as below, which is significant for cervical dystonia, colitis, and pericarditis. 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 She was able to be closed post-operatively without need for ostomy. Post operative course complicated by slow progression and inability to reliably take POs. She ultimately required TPN. 5/10 she went for abdominal CT and vomited while in the scanner with concern for aspiration. She was started on meropenem and vancomycin. Despite ABX therapy her oxygen requirements worsened in the following days. She was escalated to high flow nasal cannula and PCCM was consulted 5/12.  Past Medical History   has a past medical history of Anxiety, Cervical dystonia, Closed fracture of left distal radius, Colitis, DDD (degenerative disc disease), cervical, Depression, History of pericarditis, IBS (irritable bowel syndrome), MVP (mitral valve prolapse), Osteoporosis, Spondylosis of cervical spine, Torticollis, spasmodic, Urinary incontinence, and Wears glasses.   Significant Hospital Events   4/28 admit, to OR for colectomy.  5/10 TPN, aspiration event 5/12 hypoxia, PCCM consult.   Consults:    Procedures:  4/28 > Right colectomy.   Significant Diagnostic Tests:  4/28 CT abdomen > Positive for Cecal Volvulus with severely dilated cecum in the midline. The ascending and downstream colon is completely decompressed. Small volume of free fluid in the abdomen and pelvis. No free air. New intra- and extrahepatic biliary ductal dilatation in the  setting of chronic cholecystectomy. 4/30 CT abdomen > Status post interval right colectomy. Moderate volume four quadrant ascites with intermediate attenuation, HU = 42, increased compared to prior examination and concerning for hemoperitoneum given drop in hematocrit. Postoperative pneumoperitoneum. 5/6 CT abdomen > Prior RIGHT hemicolectomy. Diffusely dilated small bowel loops throughout abdomen and pelvis, favoring ileus in the immediate postoperative period. 5/10 CT abdomen > New LEFT LOWER lobe airspace disease/consolidation likely representing pneumonia or aspiration. Slightly increasing multiple dilated small bowel loop suspicious for bowel obstruction. No evidence of pneumoperitoneum. Moderate to large amount of ascites and small bilateral pleural effusions.  Micro Data:  Blood 5/12 > Sputum 5/12 >  Antimicrobials:  Meropenem 5/10 >5/13 Vancomycin 5/10 >5/13 Cefepime 5/13 >  Interim history/subjective:  Afebrile with Tmax 100.6. Sitting in bed, pleasant. On room air.   Objective   Blood pressure (!) 160/85, pulse (!) 102, temperature (!) 100.6 F (38.1 C), temperature source Oral, resp. rate (!) 23, height 5\' 4"  (1.626 m), weight 43.3 kg, SpO2 96 %.        Intake/Output Summary (Last 24 hours) at 03/20/2019 0810 Last data filed at 03/20/2019 0700 Gross per 24 hour  Intake 3530.7 ml  Output 1725 ml  Net 1805.7 ml   Filed Weights   03/17/19 1033 03/19/19 0411 03/20/19 0436  Weight: 43.5 kg 43.9 kg 43.3 kg   Physical Exam: General: Frail-appearing female, no acute distress, confused but pleasant HENT: Epes, AT, OP clear, MMM Eyes: EOMI, no scleral icterus Respiratory: Diminished breath sounds bilaterally.  No crackles, wheezing or rales Cardiovascular: Mild tachycardia, -M/R/G, no JVD GI: Midline wound with dressing in place, BS+, soft, nontender Extremities:-Edema,-tenderness Neuro:  Awake and alert. Able to follow commands Skin: Intact, no rashes or bruising Psych:  Normal mood, normal affect, tangential speech GU: Foley in place  Resolved Hospital Problem list     Assessment & Plan:   Acute hypoxemic respiratory failure: Likely multifactorial. Reported aspiration event with marked WBC elevation. Has been on meropenem and vanco since 5/10. Pneumonia is felt to be predominant factor. She also has recent abdominal surgery and overall weakness. She is at risk for atelectasis.  Improved oxygenation this morning. Leukocytosis trending down. Suspect aspiration pneumonia/pneumonitis.  Plan: - Wean supplemental  O2 as indicated to keep SpO2 > 92%. - De-escalate antibiotics to cefepime which should provide adequate coverage for aspiration pneumonia/pneumonitis - Follow-up culture data. If negative, recommend 5-7 day course for pneumonia  - Pain control to facilitate deep breathing and cough.  - PRN Duonebs - Incentive spirometry  - Swallow evaluation  Hypertension - Start amlodipine 5 mg daily  Electrolyte abnormalities: Hyponatremia, Hyperkalemia.  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 Currently on TPN since 5/10.  - Management per general surgery  Spasmodic torticollis: managed at [facility] with B[facility] injections, she is in the process of changing doctors.  - supportive care  Best practice:  Diet: Per CCS. Currently clear liquid with TPN Pain/Anxiety/Delirium protocol (if indicated): n/a VAP protocol (if indicated): n/a DVT prophylaxis: Lovenox GI prophylaxis: N/a Glucose control: N/a Mobility: Activity as tolerated Code Status: FULL Family Communication:  Disposition: Primary team planning transfer to SDU. Pulmonary will sign off.  Labs   CBC: Recent Labs  Lab 03/14/19 0232 03/15/19 0401 03/17/19 0538 03/18/19 0325 03/19/19 0647  WBC 12.2* 11.0* 30.6* 33.0* 42.1*  NEUTROABS  --   --   --  30.9*  --   HGB 10.3* 10.5* 11.5* 9.8* 9.2*  HCT 30.8* 30.6* 33.8* 28.3* 27.0*  MCV  94.2 94.7 94.2 92.2 96.4  PLT 585* 540* 663* 528* 480*    Basic Metabolic Panel: Recent Labs  Lab 03/15/19 0401 03/17/19 0538 03/18/19 0325 03/19/19 0647 03/19/19 0927  NA 133* 128* 127* 128* 131*  K 3.3* 3.7 3.6 5.9* 4.0  CL 97* 85* 89* 96* 97*  CO2 30 30 28 27 25   GLUCOSE 126* 142* 123* 476* 131*  BUN 8 12 15 15 15   CREATININE 0.44 0.47 0.47 0.56 0.32*  CALCIUM 8.5* 8.7* 8.3* 8.1* 8.3*  MG 1.5* 1.5* 1.7 2.1 1.7  PHOS  --  2.8 3.4 3.7 2.3*   GFR: Estimated Creatinine Clearance: 42.2 mL/min (A) (by C-G formula based on SCr of 0.32 mg/dL (L)). Recent Labs  Lab 03/15/19 0401 03/17/19 0538 03/18/19 0325 03/19/19 0647  WBC 11.0* 30.6* 33.0* 42.1*    Liver Function Tests: Recent Labs  Lab 03/18/19 0325  AST 20  ALT 28  ALKPHOS 76  BILITOT 0.9  PROT 4.9*  ALBUMIN 2.3*   No results for input(s): LIPASE, AMYLASE in the last 168 hours. No results for input(s): AMMONIA in the last 168 hours.  ABG    Component Value Date/Time   PHART 7.482 (H) 03/19/2019 0845   PCO2ART 40.5 03/19/2019 0845   PO2ART 83.0 03/19/2019 0845   HCO3 29.9 (H) 03/19/2019 0845   O2SAT 96.9 03/19/2019 0845     Coagulation Profile: No results for input(s): INR, PROTIME in the last 168 hours.  Cardiac Enzymes: Recent Labs  Lab 03/18/19 0325 03/18/19 1100 03/18/19 1835  TROPONINI 0.04* 0.04* 0.04*    HbA1C: No  results found for: HGBA1C  CBG: Recent Labs  Lab 03/19/19 0529 03/19/19 1123 03/19/19 1723 03/20/19 0027 03/20/19 0606  GLUCAP 157* 126* 123* 131* 149*   Care Time devoted to patient care services described in this note is 36 Minutes. Discussed and coordinated care with General Surgery, pharmacy and bedside RN  Mechele Collin, M.D. [facility] Pulmonary/Critical Care Medicine Pager: [phone] After hours pager: [phone]

## 2019-03-20 NOTE — Progress Notes (Signed)
Pt continued with delirium and agitation. Surgeon on call notified as well as critical care pulmonary. Ultimately, pt was made a  No intubation after critical care physician spoke with husband after seeing patient after PA saw patient. Gabriel Cirri RN

## 2019-03-20 NOTE — TOC Progression Note (Addendum)
Transition of Care [facility]) - Progression Note    Patient Details  Name: Dana [name] MRN: [id number] Date of Birth: [birth year]  Transition of [facility]) CM/SW Contact  Doy Hutching, [place] Phone Number: 03/20/2019, 1:03 PM  Clinical Narrative:  1:21pm- CSW spoke with surgery PA Wells Guiles, we discussed pt mentation and that it waxes and wanes with the pt often making nonlinear/nonsensical statements. Pt husband has been following along with pt progress and pt has been in contact via telephone with both pt husband and pt daughter. Following for progress for pt on floor.    1:03pm- CSW received call from [employer] [phone]) with [facility].  Pt had a report filed with previous CSW [place] on 5/11- no details about report in progress note. Have reached out to previous CSW to discuss nature of report.   Crystal provided with pt room number and she has sent a release of information to medical records.   Pt with waxing and waning mental status. Appropriate for home with home health but need to discuss home safety prior to d/c.    Expected Discharge Plan: Home w Home Health Services Barriers to Discharge: No Barriers Identified  Expected Discharge Plan and Services Expected Discharge Plan: Home w Home Health Services In-house Referral: NA Discharge Planning Services: CM Consult Post Acute Care Choice: Home Health Living arrangements for the past 2 months: Single Family Home                 DME Arranged: N/A DME Agency: NA       HH Arranged: RN, PT HH Agency: [facility] Date HH Agency Contacted: 03/11/19 Time HH Agency Contacted: 1232 Representative spoke with at [facility] Agency: Lupita Leash   Social Determinants of Health (SDOH) Interventions    Readmission Risk Interventions Readmission Risk Prevention Plan 03/11/2019  Transportation Screening Complete  Some recent data might be hidden

## 2019-03-20 NOTE — Progress Notes (Addendum)
PHARMACY - ADULT TOTAL PARENTERAL NUTRITION CONSULT NOTE   Received three 10 meq KCl runs today and K 3.5 (goal >=4) - ordered four more 10 meq KCl runs for tonight  Mag 3 g IV not given - retimed to be given tonight  Phos low nml - d/c KPhos from earlier, will check with am labs   Loura Back, PharmD, BCPS Clinical Pharmacist Clinical phone for 03/20/2019 until 8p is [phone] 03/20/2019 8:31 PM  **Pharmacist phone directory can now be found on [email or web address] listed under [facility]**

## 2019-03-20 NOTE — Progress Notes (Signed)
Patient transferred in to progressive unit with multiple infusions of potassium and magnesium ordered. Due to safety issues with patient, these were not able to be given in the time frames ordered. Called Pharmacy and asked times to be renewed for potasium central line and have been attempting to get the other infusions from pharmacy or transferring unit. Labs due to be drawn around 1800 but will not reflect received/not received infusions. Order for soft wrist restraints as patient behavior interfering with medical treatment. Awaiting rest of infusions. Gabriel Cirri RN

## 2019-03-20 NOTE — Progress Notes (Signed)
Pharmacy Antibiotic Note  Dana [name] is a 73 y.o. female admitted on 03/06/2019 with cecal volvulus s/p ex lap with right colectomy on 02/22/2019. CT scan on 5/6 was consistent with ileus which has persisted. Repeat CT scan today shows pneumonia. Pharmacy has been consulted for Vancomycin and Meropenem dosing, now de-escalating to Cefepime. MD aware of penicillin allergy. Penicillin allergy documented as causing rash ; patient has tolerated cephalosporins in the past.  Tmax 100.6, WBC 33, sCr 0.33, CrCl ~42  Plan: DC vancomycin and merrem Cefepime 2g IV Q12H until 5/17 (5 more days) Monitor renal function, cultures, clinical course  Height: 5\' 4"  (162.6 cm) Weight: 95 lb 7.4 oz (43.3 kg) IBW/kg (Calculated) : 54.7  Temp (24hrs), Avg:99.7 F (37.6 C), Min:98.7 F (37.1 C), Max:100.6 F (38.1 C)  Recent Labs  Lab 03/15/19 0401 03/17/19 0538 03/18/19 0325 03/19/19 0647 03/19/19 0927 03/20/19 0755  WBC 11.0* 30.6* 33.0* 42.1*  --  28.8*  CREATININE 0.44 0.47 0.47 0.56 0.32*  --     Estimated Creatinine Clearance: 42.2 mL/min (A) (by C-G formula based on SCr of 0.32 mg/dL (L)).    Allergies  Allergen Reactions  . Penicillins Rash    PATIENT HAS HAD A PCN REACTION WITH IMMEDIATE RASH, FACIAL/TONGUE/THROAT SWELLING, SOB, OR LIGHTHEADEDNESS WITH HYPOTENSION:  #  #  YES  #  #  Has patient had a PCN reaction causing severe rash involving mucus membranes or skin necrosis: NO Has patient had a PCN reaction that required hospitalization NO Has patient had a PCN reaction occurring within the last 10 years: NO  . Sinemet [Carbidopa-Levodopa] Other (See Comments)    Reports lips/fingertips swelling, red face.  . Hydrocodone Itching  . Lactose Intolerance (Gi) Diarrhea    ANY MILK PRODUCTS  . Reclast [Zoledronic Acid] Rash    Rash   . Sulfa Antibiotics Rash    Antimicrobials this admission: 4/28 Clindamycin, Gentamicin pre-op 5/10 Vancomycin >> 5/13 5/10 Meropenem >> 5/13 5/13  Cefepime >> (5/17)  Microbiology results: 5/12 Bcx pending 5/12 MRSA pcr neg  Thank you for allowing pharmacy to be a part of this patient's care.  Ewing Schlein, PharmD PGY1 Pharmacy Resident 03/20/2019    8:29 AM Please check AMION for all [facility] Pharmacy numbers

## 2019-03-20 NOTE — Plan of Care (Signed)
PCCM MD- CHANGE IN RESUSCITATION STATUS    I, Dr Newell Coral have personally reviewed patient's available data, including medical history, events of note, physical examination and test results as part of my evaluation. I have discussed with PA Celine Mans and other care providers such as pharmacist, RN and Elink.  In addition,  I personally evaluated patient  74 yr old F cachetic w/ agitated delerium not oriented to place or time POD15, s/p ex lap with right colectomy secondary to cecal volvulus, is laying in bed in no acute distress continues on supplemental O2 and O2 sat >92% at the time of my evaluation.    I have reviewed the note and assessment by PA Celine Mans and I agree. I spoke to the pt's husband regarding her current respiratory status and options moving forward.  We discussed what mechanical ventilation is and when it is indicated. We also discussed that she is not an ideal candidate given her deconditioning, overall weakness and increased frailty. Her husband Greggory Stallion [phone] or [phone])) clearly stated that he would not want his wife to be sustained by machines and that she would not have wanted it either. At this time he made his wife a Do not intubate.  He asked that care be continued with the goal to get her home to him.  I discussed the changes in plan of care and resuscitation status with RN.   While patient is at a high risk for decompensation she is not a candidate for intubation and her resuscitation status has been modified by her NOK. She may remain on the current level of care and continue with assessment and support, frequent evaluation and titration of therapies, application of advanced monitoring technologies and extensive interpretation of multiple databases by the primary team>>surgery.   PCCM has signed off  Dr. Newell Coral Pulmonary Critical Care Medicine  03/20/2019 7:49 PM

## 2019-03-20 NOTE — Progress Notes (Signed)
TPN infusion: Confirmed with pharmacy that pt is to continue TPN at current dosages. When scanned, order # [id number] read "expired or discontinued." After discussion with pharmacy (new order entered but would not reconcile with current bag) warning overridden.

## 2019-03-20 NOTE — Progress Notes (Signed)
PCCM Interval Progress Note  Called to bedside this evening due to intermittent desaturations.  Pt on 6L O2 via [place] and intermittently desaturating to high 80s.   When I entered room, SpO2 92%.  Pt with some agitated delirium and as soon as she moved around, waveform became less consistent and SpO2 dropped to 88%.  CXR from 5/12 reviewed, shows developing PNA and atelectasis.  Pt currently on cefepime.  BP (!) 161/94 (BP Location: Right Wrist)   Pulse (!) 116   Temp 98.5 F (36.9 C) (Oral)   Resp (!) 25   Ht 5\' 4"  (1.626 m)   Wt 43.3 kg   SpO2 91%   BMI 16.39 kg/m   Exam: Pt delirius with some mild agitation.  Eyes open but not following commands. Lungs diminished but clear. Abdomen soft.  CCS has placed transfer order to ICU. Continue supplemental O2 to maintain SpO2 > 88% (note, intermittent poor waveform that coincides with pts agitation / delirium). No need for intubation at this time. Continue abx. Push incentive spirometry / bronchial hygiene. Follow CXR.   Rutherford Guys, PA [facility] Pulmonary & Critical Care Medicine Pager: [phone].  If no answer, [phone] 03/20/2019, 7:45 PM

## 2019-03-20 NOTE — Progress Notes (Addendum)
PHARMACY - ADULT TOTAL PARENTERAL NUTRITION CONSULT NOTE   Pharmacy Consult for TPN Indication: Prolonged Ileus  Patient Measurements: Height: 5\' 4"  (162.6 cm) Weight: 95 lb 7.4 oz (43.3 kg) IBW/kg (Calculated) : 54.7 TPN AdjBW (KG): 42.2 Body mass index is 16.39 kg/m. Usual Weight: 80-90 lbs per patient *Weight fluctuating this admission 83 lbs up to 98 lbs.   Assessment: 73 year old female with history of colitis and weight loss admitted with cecal volvulus s/p ex lap with right colectomy on 03/06/2019. CT scan on 5/6 was consistent with ileus which has persisted. On day #13 of admission with minimal intake and intermittently NPO due the N/V, pharmacy was consulted to start TPN for persistent ileus.   GI: Pre-albumin 9.3>>6.3. Abdomen tender, distended. NGT output 415 ml, now clamped. +Flatus. No nausea. LBM 5/11 x3 per RN.  Clear liquid diet started yesterday, swallow eval planned for today Endo: No hx DM. CBG 120-150's on TPN at goal. Insulin requirements in the past 24 hours: 4 units Lytes: Na/Cl low (136/97), K 3.4 (will replace), Mg 1.6 (goal > 2 for ileus; will replace). Phos down 2.7 (will replace). Ca 9.6 (albumin 2.3).  Renal: SCr 0.33, BUN 13; UOP 1.20ml/kg/hr.  Pulm: 1L HFNC -O2 requirements decreasing Cards: Hypertensive. ST, 110-115. On Lopressor. Amlodipine added today Hepatobil: LFTs/Tbili wnl. TGs 39 Neuro: Confused. PRN Tylenol, Morphine. Tenderness noted. Pain score 0. Wellbutrin, Lexapro for anxiety/depression.  ID: Merrem and Vancomycin 5/10 >> 5/13; Starting Cefepime 5/13 WBC elevated 42>28, low fever of 100.7    TPN Access: PICC to be placed 5/10 per RN TPN start date: 03/17/19 Nutritional Goals (per RD recommendation on 5/6): KCal: 1400-1600 Protein: 70-80 kg Fluid: 1.6L/day  Goal TPN rate is 60 ml/hr (provides 80 g protein, 230 g dextrose, 43.2g lipids and 1535 kcals meeting 100% of patients needs)  Current Nutrition:  TPN Clears 5/12; taking in  0-5%  Plan:  Continue TPN at 32mL/hr. This TPN provides 80 g protein, 230 g dextrose, 43.2g lipids and 1535 kcals meeting 100% of patients needs Electrolytes in TPN: Continue with increase Na, Will increase K, Mg, and phos today and replace further outside of bag. Other lytes standard; maximize Cl.  Add MVI to TPN- trace elements are on back order, will only place in TPN on Monday, Wednesday and Friday. Continue sensitive SSI q6h and adjust as needed Give Magnesium 3g IV x1.  Give KPhos 10 mmol IV x1.  Give KCl IV Monitor TPN labs Will repeat BMET, Mag and Phos later this afternoon F/U RD recommendations, resolution of ileus and ability to advance diet   Ewing Schlein, PharmD PGY1 Pharmacy Resident 03/20/2019    10:15 AM Please check AMION for all [facility] Pharmacy numbers

## 2019-03-20 NOTE — Progress Notes (Addendum)
[facility] Progress Note  15 Days Post-Op  Subjective: CC: delirium Patient still quite delirious. Less concerned that people are trying to harm her today. Still speaking fairly non-sensically but able to answer questions about who/where/when appropriately and able to tell me that she is tolerating clears and passing flatus. Able to tell me that she is sometimes nauseated. Denies abdominal pain but reports she feels sore. Respiratory status is much improved, she is down to 1L supplemental O2 and breathing is much less labored.   Objective: Vital signs in last 24 hours: Temp:  [98.7 F (37.1 C)-100.6 F (38.1 C)] 100.6 F (38.1 C) (05/13 0737) Pulse Rate:  [40-125] 102 (05/13 0700) Resp:  [16-34] 23 (05/13 0700) BP: (112-189)/(49-136) 160/85 (05/13 0700) SpO2:  [84 %-100 %] 96 % (05/13 0700) Weight:  [43.3 kg] 43.3 kg (05/13 0436) Last BM Date: 03/18/19  Intake/Output from previous day: 05/12 0701 - 05/13 0700 In: 3033.5 [P.O.:240; I.V.:2050.8; IV Piggyback:742.7] Out: 1825 [Urine:1825] Intake/Output this shift: No intake/output data recorded.  PE: Gen: Alert, NADbut does not appear to feel well Pulm: on 1L via HFNC with O2 sat 95%, lungs CTAB Cardio:sinus tachycardia in low 100s Abd: Soft,+ BS,mild distention, non-tender, open midline incision c/d/i with proximal aspect of wound closing and distal portion with beefy red tissue and no erythema or drainage Skin: warm and dry Psych: Alert and oriented to person, place, and time; some TD like movements of neck   Lab Results:  Recent Labs    03/18/19 0325 03/19/19 0647  WBC 33.0* 42.1*  HGB 9.8* 9.2*  HCT 28.3* 27.0*  PLT 528* 480*   BMET Recent Labs    03/19/19 0647 03/19/19 0927  NA 128* 131*  K 5.9* 4.0  CL 96* 97*  CO2 27 25  GLUCOSE 476* 131*  BUN 15 15  CREATININE 0.56 0.32*  CALCIUM 8.1* 8.3*   PT/INR No results for input(s): LABPROT, INR in the last 72 hours. CMP     Component  Value Date/Time   NA 131 (L) 03/19/2019 0927   K 4.0 03/19/2019 0927   CL 97 (L) 03/19/2019 0927   CO2 25 03/19/2019 0927   GLUCOSE 131 (H) 03/19/2019 0927   BUN 15 03/19/2019 0927   CREATININE 0.32 (L) 03/19/2019 0927   CALCIUM 8.3 (L) 03/19/2019 0927   PROT 4.9 (L) 03/18/2019 0325   ALBUMIN 2.3 (L) 03/18/2019 0325   AST 20 03/18/2019 0325   ALT 28 03/18/2019 0325   ALKPHOS 76 03/18/2019 0325   BILITOT 0.9 03/18/2019 0325   GFRNONAA >60 03/19/2019 0927   GFRAA >60 03/19/2019 0927   Lipase     Component Value Date/Time   LIPASE 32 02/23/2019 2330       Studies/Results: Dg Chest Port 1 View  Result Date: 03/19/2019 CLINICAL DATA:  73 year old female with shortness of breath and hypoxia EXAM: PORTABLE CHEST 1 VIEW COMPARISON:  Most recent prior chest x-ray yesterday, 03/18/2019 FINDINGS: Interval placement of a left PICC. The catheter tip overlies the mid SVC. Stable cardiac and mediastinal contours. Atherosclerotic calcifications are present in the aorta. A gastric tube is present. The tip lies below the field of view, presumably within the stomach. Increased patchy airspace opacities throughout the right lower lobe. Stable patchy airspace opacities in the left lower lobe. Trace left pleural effusion. Upper lung predominant emphysematous changes. No pulmonary edema or pneumothorax. No acute osseous abnormality. IMPRESSION: 1. Increasing patchy right basilar airspace opacities which may reflect aspiration or pneumonia. 2.  Stable left basilar airspace opacities. 3. New left upper extremity approach PICC with the catheter tip overlying the mid SVC. 4. Stable position of gastric tube with the tip off the field of view but presumably in the stomach. 5. Small left pleural effusion. Electronically Signed   By: Malachy MoanHeath  McCullough M.D.   On: 03/19/2019 08:38    Anti-infectives: Anti-infectives (From admission, onward)   Start     Dose/Rate Route Frequency Ordered Stop   03/18/19 0400   meropenem (MERREM) 1 g in sodium chloride 0.9 % 100 mL IVPB     1 g 200 mL/hr over 30 Minutes Intravenous Every 12 hours 03/17/19 1421     03/17/19 1430  meropenem (MERREM) 1 g in sodium chloride 0.9 % 100 mL IVPB     1 g 200 mL/hr over 30 Minutes Intravenous  Once 03/17/19 1421 03/17/19 1607   03/17/19 1400  vancomycin (VANCOCIN) IVPB 1000 mg/200 mL premix     1,000 mg 200 mL/hr over 60 Minutes Intravenous Every 36 hours 03/17/19 1259     01/23/2019 0515  clindamycin (CLEOCIN) IVPB 900 mg     900 mg 100 mL/hr over 30 Minutes Intravenous To Surgery 01/23/2019 0425 01/23/2019 0531   01/23/2019 0515  gentamicin (GARAMYCIN) 210 mg in dextrose 5 % 100 mL IVPB     5 mg/kg  42.2 kg 105.3 mL/hr over 60 Minutes Intravenous STAT 01/23/2019 0425 01/23/2019 0604       Assessment/Plan Cervical dystonia ABLAnemia - hbg9.2 yesterday,mildly tachycardic Delirium -continues to have intermittent confusion.Minimize sedating/narcotic meds. Orient her to time of day. Keep blinds open during the day. Protein Calorie Malnutrition- Pre-albumin 6.3, continue TPN Elevated Blood Pressure/tachycardia-BPstable this AM, tachycardia likely secondary to PNA.No history of HTN. 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 - Pathis benign with no evidence of colitis or malignant process -CT 5/10 showed stable ascites and LLL PNA - started on abx for this - had 3 BM yesterday - ok to keep NGT clamped and try some clears today, if patient becomes nauseated reconnect NGT to LIWS LLL PNA- IV abx, Tmax 100.6, doing much better from a respiratory standpoint - CCM feels like this may have been more of a pneumonitis on top of aspiration PNA - ok to transfer out of ICU today, will send to SDU  FEN -CLD,IVF, TPN VTE -Lovenox, SCDs ID -Clinda/Gent periop;  merrem/vanc 5/10>5/13; cefepime 5/13>> Foley - DC 4/29 Follow up - Dwain SarnaWakefield POC - Husband Greggory StallionGeorge [phone]  Plan:Patient continues to be confused but seems less paranoid this AM. I would like to try to avoid any sedating medications as able.   Doing better from a respiratory standpoint, ok to transfer out to SDU. Continue to monitor. Abx narrowed to cefepime. Labs for this AM pending.   From a surgical standpoint ileus seems to be improving, but I would leave NGT in place and clamped considering prolonged ileus and aspiration 2 days ago. Would like to obtain formal SLP eval both for swallowing and cognition.  I will call patient's husband and update him later this morning.   LOS: 15 days    Wells GuilesKelly Rayburn , [facility] 03/20/2019, 7:55 AM Pager: [phone] Consults: [phone]

## 2019-03-20 NOTE — Evaluation (Signed)
Clinical/Bedside Swallow Evaluation Patient Details  Name: Dana [name] MRN: [id number] Date of Birth: 01/14/1946  Today's Date: 03/20/2019 Time: SLP Start Time (ACUTE ONLY): 1425 SLP Stop Time (ACUTE ONLY): 1455 SLP Time Calculation (min) (ACUTE ONLY): 30 min  Past Medical History:  Past Medical History:  Diagnosis Date  . Anxiety   . Cervical dystonia    TREATED BY NEUROLOGIST AT [facility]--  BOTOX INJECTIONS  . Closed fracture of left distal radius   . Colitis   . DDD (degenerative disc disease), cervical   . Depression   . History of pericarditis    1989  . IBS (irritable bowel syndrome)   . MVP (mitral valve prolapse)   . Osteoporosis   . Spondylosis of cervical spine   . Torticollis, spasmodic   . Urinary incontinence   . Wears glasses    Past Surgical History:  Past Surgical History:  Procedure Laterality Date  . CARDIAC CATHETERIZATION  04-05-2000  dr al little   normal LVF/  ef 60%/  no coronary disaease/  MVP with mild MR  . CATARACT EXTRACTION W/ INTRAOCULAR LENS  IMPLANT, BILATERAL Bilateral   . CATARACT EXTRACTION W/ INTRAOCULAR LENS IMPLANT Bilateral   . CLOSED REDUCTION RADIAL SHAFT Left 11/27/2014   Procedure: CLOSED REDUCTION DISTAL RADIUS WITH PINS;  Surgeon: Sharma CovertFred W Ortmann, MD;  [facility];  Service: Orthopedics;  Laterality: Left;  . COLONOSCOPY W/ POLYPECTOMY  03-30-2001  . COLONOSCOPY WITH PROPOFOL N/A 01/06/2017   Procedure: COLONOSCOPY WITH PROPOFOL;  Surgeon: Jeani HawkingPatrick Hung, MD;  Location: [facility];  Service: Endoscopy;  Laterality: N/A;  . DILATION AND CURETTAGE OF UTERUS  yrs ago  . ESOPHAGOGASTRODUODENOSCOPY (EGD) WITH PROPOFOL N/A 01/06/2017   Procedure: ESOPHAGOGASTRODUODENOSCOPY (EGD) WITH PROPOFOL;  Surgeon: Jeani HawkingPatrick Hung, MD;  Location: [facility];  Service: Endoscopy;  Laterality: N/A;  . LAPAROSCOPIC CHOLECYSTECTOMY  04-27-2000  . LAPAROTOMY N/A 03/04/2019   Procedure: EXPLORATORY LAPAROTOMY,  RIGHT COLECTOMY;   Surgeon: Emelia LoronWakefield, Matthew, MD;  [facility];  Service: General;  Laterality: N/A;  . PREAURICULAR CYST EXCISION Left 04/20/2018   Procedure: EXCISION PREAURICULAR CYST PEDIATRIC;  Surgeon: Osborn CohoShoemaker, David, MD;  [facility];  Service: ENT;  Laterality: Left;  Also [id number]  . ROTATOR CUFF REPAIR Right 11/ 2000   HPI:  Patient is a 73 y.o. female with PMH: cervical dystonia, colitis, pericarditis, anxiety, depression, IBS, osteoporosis, spondylosis,  who [facility] with cecal volvulus and taken to OR for urgent colectomy without need for post-op ostomy. Post-op course complicated by slow progression and inability to consistently take PO's, and patient ultimately required TPN. on 5/10 she had abdominal CT and vomited wh ile in scanner, concerning for aspiration. Despite antibiotic therapy, patient's oxygen requirements worsened and she was escalated to high-flow nasal cannula.   Assessment / Plan / Recommendation Clinical Impression  Patient presents with a mild-moderate oropharyngeal swallow disorder with impact of poor cognition at this time. Patient did not exhibit any overt s/s but did exhbiit discoordinated swallow at pharyngeal phase and inconsistent laryngeal elevation and pharyngeal contraction. As patient currently on TPN for nutrition, current cognitive status is poor and previous concern for aspiration, SLP recommending to keep patient NPO and conduct objective swallow study next date (MBS). SLP Visit Diagnosis: Dysphagia, unspecified (R13.10)    Aspiration Risk  Moderate aspiration risk    Diet Recommendation NPO except meds   Medication Administration: Crushed with puree    Other  Recommendations Oral Care  Recommendations: Oral care BID   Follow up Recommendations Skilled Nursing facility;24 hour supervision/assistance      Frequency and Duration min 2x/week  1 week       Prognosis Prognosis for Safe Diet Advancement: Good Barriers to Reach  Goals: Cognitive deficits      Swallow Study   General Date of Onset: 02/20/2019 HPI: Patient is a 72 y.o. female with PMH: cervical dystonia, colitis, pericarditis, anxiety, depression, IBS, osteoporosis, spondylosis,  who was admitted to [facility] hospital on 4/28 with cecal volvulus and taken to OR for urgent colectomy without need for post-op ostomy. Post-op course complicated by slow progression and inability to consistently take PO's, and patient ultimately required TPN. on 5/10 she had abdominal CT and vomited wh ile in scanner, concerning for aspiration. Despite antibiotic therapy, patient's oxygen requirements worsened and she was escalated to high-flow nasal cannula. Type of Study: Bedside Swallow Evaluation Previous Swallow Assessment: N/A Diet Prior to this Study: NPO Temperature Spikes Noted: No Respiratory Status: Nasal cannula History of Recent Intubation: No Behavior/Cognition: Alert;Cooperative;Confused;Pleasant mood;Distractible;Requires cueing;Doesn't follow directions Oral Cavity Assessment: Within Functional Limits Oral Care Completed by SLP: Yes Oral Cavity - Dentition: Adequate natural dentition Self-Feeding Abilities: Total assist(has bilateral mitts) Patient Positioning: Upright in bed Baseline Vocal Quality: Normal Volitional Cough: Cognitively unable to elicit Volitional Swallow: Unable to elicit    Oral/Motor/Sensory Function Overall Oral Motor/Sensory Function: Within functional limits   Ice Chips     Thin Liquid Thin Liquid: Impaired Presentation: Straw Oral Phase Impairments: Poor awareness of bolus Pharyngeal  Phase Impairments: Suspected delayed Swallow;Other (comments)(discoordinated swallow and inconsistent laryngeal elevation/movement)    Nectar Thick     Honey Thick     Puree Puree: Impaired Presentation: Spoon Pharyngeal Phase Impairments: Multiple swallows;Suspected delayed Swallow   Solid     Solid: Impaired Oral Phase Impairments: Impaired  mastication Pharyngeal Phase Impairments: Suspected delayed Swallow      Pablo Lawrence 03/20/2019,5:47 PM   Angela Nevin, MA, CCC-SLP Speech Therapy [facility] Acute Rehab Pager: [phone]

## 2019-03-21 ENCOUNTER — Inpatient Hospital Stay (HOSPITAL_COMMUNITY): Payer: Medicare Other

## 2019-03-21 DIAGNOSIS — K562 Volvulus: Principal | ICD-10-CM

## 2019-03-21 LAB — COMPREHENSIVE METABOLIC PANEL
ALT: 21 U/L (ref 0–44)
AST: 27 U/L (ref 15–41)
Albumin: 2 g/dL — ABNORMAL LOW (ref 3.5–5.0)
Alkaline Phosphatase: 94 U/L (ref 38–126)
Anion gap: 7 (ref 5–15)
BUN: 16 mg/dL (ref 8–23)
CO2: 28 mmol/L (ref 22–32)
Calcium: 8 mg/dL — ABNORMAL LOW (ref 8.9–10.3)
Chloride: 101 mmol/L (ref 98–111)
Creatinine, Ser: 0.4 mg/dL — ABNORMAL LOW (ref 0.44–1.00)
GFR calc Af Amer: >60 mL/min (ref >60)
GFR calc non Af Amer: >60 mL/min (ref >60)
Glucose, Bld: 136 mg/dL — ABNORMAL HIGH (ref 70–99)
Potassium: 4.4 mmol/L (ref 3.5–5.1)
Sodium: 136 mmol/L (ref 135–145)
Total Bilirubin: 0.9 mg/dL (ref 0.3–1.2)
Total Protein: 4.7 g/dL — ABNORMAL LOW (ref 6.5–8.1)

## 2019-03-21 LAB — POCT I-STAT 7, (LYTES, BLD GAS, ICA,H+H)
Acid-Base Excess: 4 mmol/L — ABNORMAL HIGH (ref 0.0–2.0)
Bicarbonate: 29.9 mmol/L — ABNORMAL HIGH (ref 20.0–28.0)
Calcium, Ion: 1.26 mmol/L (ref 1.15–1.40)
HCT: 27 % — ABNORMAL LOW (ref 36.0–46.0)
Hemoglobin: 9.2 g/dL — ABNORMAL LOW (ref 12.0–15.0)
O2 Saturation: 88 %
Potassium: 4.4 mmol/L (ref 3.5–5.1)
Sodium: 134 mmol/L — ABNORMAL LOW (ref 135–145)
TCO2: 32 mmol/L (ref 22–32)
pCO2 arterial: 54.2 mmHg — ABNORMAL HIGH (ref 32.0–48.0)
pH, Arterial: 7.349 — ABNORMAL LOW (ref 7.350–7.450)
pO2, Arterial: 59 mmHg — ABNORMAL LOW (ref 83.0–108.0)

## 2019-03-21 LAB — GLUCOSE, CAPILLARY
Glucose-Capillary: 155 mg/dL — ABNORMAL HIGH (ref 70–99)
Glucose-Capillary: 145 mg/dL — ABNORMAL HIGH (ref 70–99)

## 2019-03-21 LAB — SARS CORONAVIRUS 2 BY RT PCR (HOSPITAL ORDER, PERFORMED IN ~~LOC~~ HOSPITAL LAB): SARS Coronavirus 2: NEGATIVE

## 2019-03-21 LAB — PHOSPHORUS: Phosphorus: 3.5 mg/dL (ref 2.5–4.6)

## 2019-03-21 LAB — CBC
HCT: 26.5 % — ABNORMAL LOW (ref 36.0–46.0)
Hemoglobin: 9 g/dL — ABNORMAL LOW (ref 12.0–15.0)
MCH: 32.4 pg (ref 26.0–34.0)
MCHC: 34 g/dL (ref 30.0–36.0)
MCV: 95.3 fL (ref 80.0–100.0)
Platelets: 381 10*3/uL (ref 150–400)
RBC: 2.78 MIL/uL — ABNORMAL LOW (ref 3.87–5.11)
RDW: 15.3 % (ref 11.5–15.5)
WBC: 45.6 10*3/uL — ABNORMAL HIGH (ref 4.0–10.5)
nRBC: 0 % (ref 0.0–0.2)

## 2019-03-21 LAB — MAGNESIUM: Magnesium: 2.1 mg/dL (ref 1.7–2.4)

## 2019-03-21 MED ORDER — TRAVASOL 10 % IV SOLN
INTRAVENOUS | Status: DC
  Filled 2019-03-21: qty 799.2

## 2019-03-21 MED ORDER — LORAZEPAM 2 MG/ML PO CONC: 1.0000 mg | ORAL | Status: DC | PRN

## 2019-03-21 MED ORDER — SODIUM CHLORIDE 0.9% FLUSH: 3.0000 mL | INTRAVENOUS | Status: DC | PRN

## 2019-03-21 MED ORDER — MORPHINE BOLUS VIA INFUSION
1.0000 mg | INTRAVENOUS | Status: DC | PRN
  Administered 2019-03-21: 1 mg via INTRAVENOUS
  Filled 2019-03-21: qty 1

## 2019-03-21 MED ORDER — SODIUM CHLORIDE 0.9% FLUSH: 3.0000 mL | Freq: Two times a day (BID) | INTRAVENOUS | Status: DC

## 2019-03-21 MED ORDER — GLYCOPYRROLATE 1 MG PO TABS
1.0000 mg | ORAL_TABLET | ORAL | Status: DC | PRN
  Filled 2019-03-21: qty 1

## 2019-03-21 MED ORDER — LORAZEPAM 2 MG/ML IJ SOLN: 1.0000 mg | INTRAMUSCULAR | Status: DC | PRN

## 2019-03-21 MED ORDER — GLYCOPYRROLATE 0.2 MG/ML IJ SOLN
0.2000 mg | INTRAMUSCULAR | Status: DC | PRN
  Administered 2019-03-21: 23:00:00 0.2 mg via INTRAVENOUS
  Filled 2019-03-21: qty 1

## 2019-03-21 MED ORDER — IOHEXOL 350 MG/ML SOLN
100.0000 mL | Freq: Once | INTRAVENOUS | Status: AC | PRN
  Administered 2019-03-21: 10:00:00 100 mL via INTRAVENOUS

## 2019-03-21 MED ORDER — LORAZEPAM 1 MG PO TABS: 1.0000 mg | ORAL_TABLET | ORAL | Status: DC | PRN

## 2019-03-21 MED ORDER — MORPHINE SULFATE (PF) 2 MG/ML IV SOLN
1.0000 mg | INTRAVENOUS | Status: DC | PRN
  Administered 2019-03-21 (×2): 2 mg via INTRAVENOUS
  Filled 2019-03-21 (×2): qty 1

## 2019-03-21 MED ORDER — LORAZEPAM 2 MG/ML IJ SOLN
1.0000 mg | INTRAMUSCULAR | Status: DC | PRN
  Administered 2019-03-21: 1 mg via INTRAVENOUS
  Filled 2019-03-21: qty 1

## 2019-03-21 MED ORDER — MORPHINE 100MG IN NS 100ML (1MG/ML) PREMIX INFUSION
0.0000 mg/h | INTRAVENOUS | Status: DC
  Administered 2019-03-21: 2 mg/h via INTRAVENOUS
  Filled 2019-03-21: qty 100

## 2019-03-21 MED ORDER — SODIUM CHLORIDE 0.9 % IV SOLN: 250.0000 mL | INTRAVENOUS | Status: DC | PRN

## 2019-03-21 MED ORDER — GLYCOPYRROLATE 0.2 MG/ML IJ SOLN: 0.2000 mg | INTRAMUSCULAR | Status: DC | PRN

## 2019-03-21 MED ORDER — MORPHINE SULFATE (PF) 2 MG/ML IV SOLN
1.0000 mg | INTRAVENOUS | Status: DC | PRN
  Administered 2019-03-21: 14:00:00 1 mg via INTRAVENOUS
  Filled 2019-03-21: qty 1

## 2019-03-21 MED ORDER — MORPHINE SULFATE (PF) 2 MG/ML IV SOLN: 1.0000 mg | INTRAVENOUS | Status: DC | PRN

## 2019-03-22 NOTE — Progress Notes (Signed)
03/22/2019 @ 2358- Patient became asystole on the monitor. Dorthula Matas RN and Gwynn Burly RN auscultated no heart sounds at this time. TOD pronounced and reported to attending physician Dr. Luisa Hart. Patient's daughter at bedside at this time.

## 2019-03-22 NOTE — Progress Notes (Signed)
Wasted 71mL of morphine in stericycle with [place] Allen, RN

## 2019-03-24 LAB — CULTURE, BLOOD (ROUTINE X 2)
Culture: NO GROWTH
Culture: NO GROWTH
Special Requests: ADEQUATE
Special Requests: ADEQUATE

## 2019-03-30 NOTE — Discharge Summary (Signed)
[facility] Summary   Patient ID: Dana Dana [name] Slaydon MRN: [id number] DOB/AGE: 74/03/1946 73 y.Dana [name].  Admit date: 03/03/2019 Date upon expiration: 10-07-2019  Admitting Diagnosis: Cecal volvulus  Discharge Diagnosis Patient Active Problem List   Diagnosis Date Noted  . Aspiration pneumonia due to vomitus (HCC)   . Delirium   . For resuscitation status   . Cervical dystonia   . Acute abdominal pain 03/06/2019  . 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 Small vessel disease, cerebrovascular 12/06/2018  . 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 Neck pain 10/02/2018  . Lesion of skin of left ear 04/20/2018    Consultants CCM - Oretha MilchAlva, Rakesh V, MD   Imaging: No results found.  Procedures Dr. Dwain SarnaWakefield (02/15/2019) - Exploratory Laparotomy, open right hemicolectomy  H&P 73 yof with history of cervical dystonia and "colitis" for several years that she has lost fair amount of weight. She has alternating diarrhea and constipation.  She has csc in 2018 with significant looping with no mucosal abnormality and biopsies that are normal.  She got up from sitting today and had immediate sharp right sided pain. This has persisted. This is not getting better, worsening, feels bloated.  No bm/flatus.  Having emesis. 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  Hospital Course:  Dana Dana [name] Marciel is a 73 y.Dana [name]. female who was admitted to the general surgery team for a cecal volvulus on 4/28. She was taken to the OR with Dr. Dwain SarnaWakefield for a exploratory Laparotomy, open right hemicolectomy on 4/28. The patient tolerated the procedure well and was transferred to the floor. Her NGT was d/c'd and she was started on CLD on POD 1. On POD 2 her Hgb had dropped from 14 pre-op to 8.9 on POD 1 to 6.6 on POD 2. VSS. A CTAP was obtained to evaluate for a source of bleeding.  CTAP showed moderate hemoperitoneum c/w drop in her hgb.  No active extravasation was noted on her CT Scan.  She was give 1 unit  PRBC. On POD 3 the patient became very confused. She required Haldol that mornig. Her medications were adjusted to minimal sedating/narcotic medications. On POD 4 the patient's delirium continued but improved. She required orientation to time of day. Her pathology returned and was benign with no evidence of colitis or malignant process. PT recommended HH. On POD 5 her diet was advanced to a soft diet. Her Hgb was stable and chemical prophylaxis was okay to be started. On POD 7 the patient began having episodes of emesis and distension. KUB was c/w ileus. Her diet was backed off. On POD 8 patient continued to have pain and some nausea. Her WBC was elevated and the decision was made to obtain a CTAP to r/Dana [name] intra-abdominal abscesses. The patient's delirium was improving and phone conversations with patient's husband report, he felt she was getting back to her baseline. The CT was c/w ileus. There was no evidence of intra-abdominal abscesses. A UA and CXR were obtained to look for other sources of her leukocytosis, which were negative. Her diet was slowly advanced as bowel function returned. Patient was started on BP medications for persistently elevated BP in the hospital. This improved. On POD 12 the patient began complaining of increasing pain and had some episodes of emesis overnight. Her WBC was up to 30.6. She was made NPO, an NGT was placed and a repeat CT scan was obtained. There was concern for aspiration PNA for which a CXR was obtained  and was consistent with this. She was placed on o2 and started on abx. Patient continued to have intermittent confusion.  Per notes she felt "that her husband is conspiring against her". SW contacted APS. On POD 14 the patient was requiring oxygen (6L). CCM was consulted and she was placed in ICU for close monitoring. On POD 15 the patient was doing better, only requiring 1L o2. She was transferred out of the unit to step down. Overnight the patient developed worsening  agitation/delirum and hypoxia. She was placed on a NRB. She was somnolent on exam. She was transferred back to the ICU given respiratory decompensation in the setting of PNA. CCM was re consulted. A CTA was ordered to rule out PE. This was negative but did show extensive bilateral airspace disease superimposed on emphysema consistent with multifocal PNA. The husband spoke with CCM and elected to persue DNR + DNI status. Dr. Cliffton Asters discussed with the husband -  "that in the coming hours, without escalation of care, its highly likely that due to her increased work of breathing she will tire which will result in her demise. He requests that at this point we not escalate care, no pressors/cpr/intubation and make our goals be to ensure she is not suffering or uncomfortable. We discussed that options to ensure she is comfortable include a morphine which he would like [place] to proceed with this - we will plan to increase the frequency of this 1-3 mg Q1hr as needed. He would like his daughters and grandchildren be able to see her today as well and the ICU team is working to make this happen". Per nursing notes, "on April 19, 2019 @ 2358- Patient became asystole on the monitor. Dorthula Matas RN and Gwynn Burly RN auscultated no heart sounds at this time. TOD pronounced".  Physical Exam: N/A. Please see progress notes.    Allergies as of 03/22/2019      Reactions   Penicillins Rash   PATIENT HAS HAD A PCN REACTION WITH IMMEDIATE RASH, FACIAL/TONGUE/THROAT SWELLING, SOB, OR LIGHTHEADEDNESS WITH HYPOTENSION:  #  #  YES  #  #  Has patient had a PCN reaction causing severe rash involving mucus membranes or skin necrosis: NO Has patient had a PCN reaction that required hospitalization NO Has patient had a PCN reaction occurring within the last 10 years: NO   Sinemet [carbidopa-levodopa] Other (See Comments)   Reports lips/fingertips swelling, red face.   Hydrocodone Itching   Lactose Intolerance (gi) Diarrhea   ANY MILK  PRODUCTS   Reclast [zoledronic Acid] Rash   Rash    Sulfa Antibiotics Rash      Medication List    ASK your doctor about these medications   aspirin EC 81 MG tablet Take 81 mg by mouth every 6 (six) hours as needed for mild pain.   bismuth subsalicylate 262 MG/15ML suspension Commonly known as:  PEPTO BISMOL Take 30 mLs by mouth as needed for indigestion or diarrhea or loose stools.   BOTOX IJ Inject 1 Dose as directed every 3 (three) months. Botox Injection for Dystonia Administer at [facility]   Brain Tabs Take 1 tablet by mouth 2 (two) times daily. Natrol Cognium 100 mg   buPROPion 150 MG 24 hr tablet Commonly known as:  WELLBUTRIN XL Take 150 mg by mouth daily.   clonazePAM 1 MG tablet Commonly known as:  KLONOPIN Take 1 mg by mouth 3 (three) times daily.   COLLAGEN PO Take 3 capsules by mouth 2 (two) times daily.  In the morning & in the afternoon.   COSAMIN DS PO Take 3 tablets by mouth daily.   escitalopram 20 MG tablet Commonly known as:  LEXAPRO Take 20 mg by mouth 2 (two) times daily. IN THE MORNING & WITH LUNCH   Ginger Root 550 MG Caps Take 1,100 mg by mouth once a week.   HYDROcodone-acetaminophen 5-325 MG tablet Commonly known as:  Norco Take 1-2 tablets by mouth every 6 (six) hours as needed for moderate pain.   Krill Oil 500 MG Caps Take 500 mg by mouth daily.   modafinil 200 MG tablet Commonly known as:  PROVIGIL Take 100 mg by mouth 2 (two) times daily.   multivitamin with minerals Tabs tablet Take 1 tablet by mouth daily. Women's Multivitamin   Systane 0.4-0.3 % Soln Generic drug:  Polyethyl Glycol-Propyl Glycol Place 1 drop into both eyes 2 (two) times daily.   Turmeric 500 MG Caps Take 500 mg by mouth daily.   vitamin B-12 1000 MCG tablet Commonly known as:  CYANOCOBALAMIN Take 1,000 mcg by mouth daily.   vitamin C 1000 MG tablet Take 1,000 mg by mouth daily.   Vitamin D3 50 MCG (2000 UT) Tabs Take 6,000 Units by mouth  daily.       Signed: Jacinto Halim, [facility] 03/30/2019, 10:47 AM Pager: [phone]

## 2019-04-08 NOTE — Care Management Important Message (Signed)
Important Message  Patient Details  Name: Dana [name] MRN: [id number] Date of Birth: [birth year]   Medicare Important Message Given:  Yes    [name] Stefan Church 03-27-19, 2:46 PM

## 2019-04-08 NOTE — Progress Notes (Signed)
eLink Physician-Brief Progress Note Patient Name: Dana [name] DOB: 11-19-1945 MRN: [id number]   Date of Service  03/09/2019  HPI/Events of Note  Per family wishes patient made DNR and they requested comfort measures as documented in Dr. Reginia Naas note.  eICU Interventions  Comfort measures order set ordered.        Migdalia Dk 03/25/2019, 9:53 PM

## 2019-04-08 NOTE — Progress Notes (Signed)
Spoke with Leary Roca, PA regarding a morphine drip for Ms. Dana [name]. He does not want to move forward with a drip at this time. I will let Dr. Vassie Loll and his team know. Will continue to monitor. Ripley Fraise D

## 2019-04-08 NOTE — Significant Event (Signed)
Rapid Response Event Note  Overview:  While looking thru charts, pt found to have high MEWS. spO2 low in flowsheets. RT notified and went to assess pt. On arrival, pt spO2 38% with good waveform. No O2 on pt on arrival. RT at bedside, pt placed on NRB- sats improved to 95%. Pt more alert and speech improved with NRB. spO2 alarms turned on.    Plan of Care (if not transferred): Continue to monitor pt respiratory status. Bedside RN and charge RN made aware. Pt to keep O2 on, if sats drop, may need bipap. Will continue to monitor.   Event Summary:  arrived at  0305   event ended at  0335        Mordecai Rasmussen

## 2019-04-08 NOTE — Consult Note (Signed)
NAME:  Dana [name], MRN:  [id number], DOB:  08-23-1946, LOS: 16 ADMISSION DATE:  02/07/2019, CONSULTATION DATE:  5/14  REFERRING MD:  CCS Wakefieled, CHIEF COMPLAINT:  Respiratory distress   Brief History   73 yo F acutely hypoxic overnight. POD16 ex lap with R colectomy. Started on NRB. Transferred to ICU. Now DNR/DNI  Past Medical History  Cecal Volvulus Cervical dystonia DDD Osteoporosis Mitral valve prolapse IBS Pericarditis Depression Colitis   Significant Hospital Events   Ex lap with colectomy 4/28 5/14  Transfer ti ICU, now DNR/DNI  Consults:  CCS PCCM  Procedures:  4/28 right colectomy   Significant Diagnostic Tests:  4/28 CT abdomen > Positive for Cecal Volvulus with severely dilated cecum in the midline. The ascending and downstream colon is completely decompressed. Small volume of free fluid in the abdomen and pelvis. No free air. New intra- and extrahepatic biliary ductal dilatation in the setting of chronic cholecystectomy. 4/30 CT abdomen > Status post interval right colectomy. Moderate volume four quadrant ascites with intermediate attenuation, HU = 42, increased compared to prior examination and concerning for hemoperitoneum given drop in hematocrit. Postoperative pneumoperitoneum. 5/6 CT abdomen > Prior RIGHT hemicolectomy. Diffusely dilated small bowel loops throughout abdomen and pelvis, favoring ileus in the immediate postoperative period. 5/10 CT abdomen > New LEFT LOWER lobe airspace disease/consolidation likely representing pneumonia or aspiration. Slightly increasing multiple dilated small bowel loop suspicious for bowel obstruction. No evidence of pneumoperitoneum. Moderate to large amount of ascites and small bilateral pleural effusions  Micro Data:  BCx 5/12> NGTD Sputum Cx >>>  SARS Cov2 5/14>>>   Antimicrobials:  Cefepime 5/13>>> Meropenem 5/10-5/13 Vancomycin 5/10-5/13 Interim history/subjective:  Transferred to ICU for respiratory  distress.  Patient now DNR/DNI.  Anticipating transition to comfort care, husband en route to hospital now   Objective   Blood pressure (!) 152/80, pulse (!) 103, temperature 98 F (36.7 C), temperature source Axillary, resp. rate (!) 26, height  (1.626 m), weight 43.3 kg, SpO2 96 %.        Intake/Output Summary (Last 24 hours) at 04-15-19 1014 Last data filed at 04/15/19 0845 Gross per 24 hour  Intake 1110 ml  Output 2725 ml  Net -1615 ml   Filed Weights   03/19/19 0411 03/20/19 0436 Apr 15, 2019 0500  Weight: 43.9 kg 43.3 kg 43.3 kg    Examination: General: elderly appearing female, in moderate respiratory distress on NRB  HENT: NCAT. Pink mmm, trachea midline  Lungs: Accessory muscle recruitment, symmetrical chest expansion. Scattered rhonchi  Cardiovascular: RRR no JVD 1+ peripheral pulses  Abdomen: Soft, ndnt  Extremities: Symmetrical bulk and tone. No obvious joint deformity  Neuro: Somnolent. Does not follow command. PERRL.  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 -Per CCS   Rest per primary   Best practice:  Diet: TPN  VTE ppx: lovenox  Pain/Anxiety/Delirium protocol (if indicated): PRN Morphine  Mobility: bedrest Code Status: DNR/DNI  Family Communication: Husband updated on phone Disposition: ICU  Labs   CBC: Recent Labs  Lab 03/17/19 0538 03/18/19 0325 03/19/19 0647 03/20/19 0755 04/03/2019 0650 03/23/2019  0920  WBC 30.6* 33.0* 42.1* 28.8* 45.6*  --   NEUTROABS  --  30.9*  --   --   --   --   HGB 11.5* 9.8* 9.2* 8.9* 9.0* 9.2*  HCT 33.8* 28.3* 27.0* 26.5* 26.5* 27.0*  MCV 94.2 92.2 96.4 96.4 95.3  --   PLT 663* 528* 480* 430* 381  --     Basic Metabolic Panel: Recent Labs  Lab 03/19/19 0647 03/19/19 0927 03/20/19 0755 03/20/19 1912 04/05/2019 0650 03/14/2019 0920  NA 128* 131* 136 133* 136 134*  K 5.9* 4.0 3.4* 3.5 4.4 4.4  CL 96* 97* 97* 97* 101  --   CO2 27 25 31 26 28   --   GLUCOSE 476* 131* 106* 141* 136*  --   BUN 15 15 13 11 16   --   CREATININE 0.56 0.32* 0.33* 0.33* 0.40*  --   CALCIUM 8.1* 8.3* 8.2* 8.2* 8.0*  --   MG 2.1 1.7 1.6* 1.5* 2.1  --   PHOS 3.7 2.3* 2.7 2.6 3.5  --    GFR: Estimated Creatinine Clearance: 42.2 mL/min (A) (by C-G formula based on SCr of 0.4 mg/dL (L)). Recent Labs  Lab 03/18/19 0325 03/19/19 0647 03/20/19 0755 03/29/2019 0650  WBC 33.0* 42.1* 28.8* 45.6*    Liver Function Tests: Recent Labs  Lab 03/18/19 0325 03/12/2019 0650  AST 20 27  ALT 28 21  ALKPHOS 76 94  BILITOT 0.9 0.9  PROT 4.9* 4.7*  ALBUMIN 2.3* 2.0*   No results for input(s): LIPASE, AMYLASE in the last 168 hours. No results for input(s): AMMONIA in the last 168 hours.  ABG    Component Value Date/Time   PHART 7.349 (L) 03/26/2019 0920   PCO2ART 54.2 (H) 03/26/2019 0920   PO2ART 59.0 (L) 03/14/2019 0920   HCO3 29.9 (H) 03/28/2019 0920   TCO2 32 03/31/2019 0920   O2SAT 88.0 04/05/2019 0920     Coagulation Profile: No results for input(s): INR, PROTIME in the last 168 hours.  Cardiac Enzymes: Recent Labs  Lab 03/18/19 0325 03/18/19 1100 03/18/19 1835  TROPONINI 0.04* 0.04* 0.04*    HbA1C: No results found for: HGBA1C  CBG: Recent Labs  Lab 03/19/19 1723 03/20/19 0027 03/20/19 0606 03/20/19 1137 03/18/2019 0543  GLUCAP 123* 131* 149* 122* 155*    Review of Systems:   Unable to obtain, patient acutely encehpalopathic   Past Medical History   She,  has a past medical history of Anxiety, Cervical dystonia, Closed fracture of left distal radius, Colitis, DDD (degenerative disc disease), cervical, Depression, History of pericarditis, IBS (irritable bowel syndrome), MVP (mitral valve prolapse), Osteoporosis, Spondylosis of cervical spine, Torticollis, spasmodic, Urinary incontinence, and Wears glasses.   Surgical History    Past Surgical History:  Procedure Laterality Date  . CARDIAC CATHETERIZATION  04-05-2000  dr al little   normal LVF/  ef 60%/  no coronary disaease/  MVP with mild MR  . CATARACT EXTRACTION W/ INTRAOCULAR LENS  IMPLANT, BILATERAL Bilateral   . CATARACT EXTRACTION W/ INTRAOCULAR LENS IMPLANT Bilateral   . CLOSED REDUCTION RADIAL SHAFT Left 11/27/2014   Procedure: CLOSED REDUCTION DISTAL RADIUS WITH PINS;  Surgeon: [name], MD;  Location: [facility];  Service: Orthopedics;  Laterality: Left;  . COLONOSCOPY W/ POLYPECTOMY  03-30-2001  . COLONOSCOPY WITH PROPOFOL N/A 01/06/2017   Procedure: COLONOSCOPY WITH  PROPOFOL;  Surgeon: Jeani HawkingPatrick Hung, MD;  Location: [facility];  Service: Endoscopy;  Laterality: N/A;  . DILATION AND CURETTAGE OF UTERUS  yrs ago  . ESOPHAGOGASTRODUODENOSCOPY (EGD) WITH PROPOFOL N/A 01/06/2017   Procedure: ESOPHAGOGASTRODUODENOSCOPY (EGD) WITH PROPOFOL;  Surgeon: Jeani HawkingPatrick Hung, MD;  Location: [facility];  Service: Endoscopy;  Laterality: N/A;  . LAPAROSCOPIC CHOLECYSTECTOMY  04-27-2000  . LAPAROTOMY N/A 03/07/2019   Procedure: EXPLORATORY LAPAROTOMY,  RIGHT COLECTOMY;  Surgeon: Emelia LoronWakefield, Matthew, MD;  [facility];  Service: General;  Laterality: N/A;  . PREAURICULAR CYST EXCISION Left 04/20/2018   Procedure: EXCISION PREAURICULAR CYST PEDIATRIC;  Surgeon: Osborn CohoShoemaker, David, MD;  [facility];  Service: ENT;  Laterality: Left;  Also [id number]  . ROTATOR CUFF REPAIR Right 11/ 2000     Social History   reports that she quit smoking about 30 years ago. Her smoking use included  cigarettes. She has a 30.00 pack-year smoking history. She has never used smokeless tobacco. She reports that she does not drink alcohol or use drugs.   Family History   Her family history is not on file.   Allergies Allergies  Allergen Reactions  . Penicillins Rash    PATIENT HAS HAD A PCN REACTION WITH IMMEDIATE RASH, FACIAL/TONGUE/THROAT SWELLING, SOB, OR LIGHTHEADEDNESS WITH HYPOTENSION:  #  #  YES  #  #  Has patient had a PCN reaction causing severe rash involving mucus membranes or skin necrosis: NO Has patient had a PCN reaction that required hospitalization NO Has patient had a PCN reaction occurring within the last 10 years: NO  . Sinemet [Carbidopa-Levodopa] Other (See Comments)    Reports lips/fingertips swelling, red face.  . Hydrocodone Itching  . Lactose Intolerance (Gi) Diarrhea    ANY MILK PRODUCTS  . Reclast [Zoledronic Acid] Rash    Rash   . Sulfa Antibiotics Rash     Home Medications  Prior to Admission medications   Medication Sig Start Date End Date Taking? Authorizing Provider  Ascorbic Acid (VITAMIN C) 1000 MG tablet Take 1,000 mg by mouth daily.    Yes [provider]  aspirin EC 81 MG tablet Take 81 mg by mouth every 6 (six) hours as needed for mild pain.    Yes [provider]  bismuth subsalicylate (PEPTO BISMOL) 262 MG/15ML suspension Take 30 mLs by mouth as needed for indigestion or diarrhea or loose stools.    Yes [provider]  buPROPion (WELLBUTRIN XL) 150 MG 24 hr tablet Take 150 mg by mouth daily. 03/22/18  Yes [provider]  Cholecalciferol (VITAMIN D3) 2000 units TABS Take 6,000 Units by mouth daily.   Yes [provider]  clonazePAM (KLONOPIN) 1 MG tablet Take 1 mg by mouth 3 (three) times daily.    Yes [provider]  COLLAGEN PO Take 3 capsules by mouth 2 (two) times daily. In the morning & in the afternoon.   Yes [provider]  escitalopram (LEXAPRO) 20 MG tablet Take 20 mg by  mouth 2 (two) times daily. IN THE MORNING & WITH LUNCH   Yes [provider]  Ginger, Zingiber officinalis, (GINGER ROOT) 550 MG CAPS Take 1,100 mg by mouth once a week.    Yes [provider]  Glucosamine-Chondroitin (COSAMIN DS PO) Take 3 tablets by mouth daily.   Yes [provider]  HYDROcodone-acetaminophen (NORCO) 5-325 MG tablet Take 1-2 tablets by mouth every 6 (six) hours as needed for moderate pain. 04/20/18  Yes Osborn CohoShoemaker, David, MD  Boris LownKrill  Oil 500 MG CAPS Take 500 mg by mouth daily.   Yes [provider]  modafinil (PROVIGIL) 200 MG tablet Take 100 mg by mouth 2 (two) times daily. 04/05/18  Yes [provider]  Multiple Vitamin (MULTIVITAMIN WITH MINERALS) TABS tablet Take 1 tablet by mouth daily. Women's Multivitamin   Yes [provider]  OnabotulinumtoxinA (BOTOX IJ) Inject 1 Dose as directed every 3 (three) months. Botox Injection for Dystonia Administer at [facility]   Yes [provider]  Polyethyl Glycol-Propyl Glycol (SYSTANE) 0.4-0.3 % SOLN Place 1 drop into both eyes 2 (two) times daily.   Yes [provider]  Specialty Vitamins Products (BRAIN) TABS Take 1 tablet by mouth 2 (two) times daily. Natrol Cognium 100 mg   Yes [provider]  Turmeric 500 MG CAPS Take 500 mg by mouth daily.   Yes [provider]  vitamin B-12 (CYANOCOBALAMIN) 1000 MCG tablet Take 1,000 mcg by mouth daily.   Yes [provider]     Critical care time: 35 minutes     Tessie Fass MSN, AGACNP-BC [facility] [phone] If no answer, [phone] 2019-04-15, 10:15 AM

## 2019-04-08 NOTE — Progress Notes (Signed)
Spoke extensively with patient's daughter and other family about patient's poor prognosis. Patient's daughter then spoke with patient's husband to confirm going full comfort care to alleviate patient's pain. I spoke with patient's husband as well and mentioned starting the process and he agreed. Morphine gtt started at 2104. Non-rebreather removed and nasal cannula placed on for comfort. Notified eLink MD about the comfort care order set to be placed. Will continue to monitor and titrate morphine for patient's comfort. Patient's family at bedside at this time.

## 2019-04-08 NOTE — Progress Notes (Signed)
Pt transferred to 4 No ICU 18. She is dyspneic and in distress. Dr Vassie Loll on phone with husband. Pt was made full DNR and her husband was invited to come to the bedside.

## 2019-04-08 NOTE — Progress Notes (Addendum)
16 Days Post-Op  Subjective: CC: SOB Patient had episodes of hypoxia for which rapid response was called overnight. spO2 as low as 38% with good waveform on monitor per notes. Was placed on NRB with improvement. Did have a fever overnight and was noted to have tachycardia and tachypnea throughout the night. Currently in respiratory distress this morning. Unable to answer questions 2/2 to SOB. Noted tachypnea. O2 sats low 70's. This improved to 95% when placed back on NRB. Unable to answer questions fully. No N/V reported from overnight. One BM recorded from overnight.   Objective: Vital signs in last 24 hours: Temp:  [97.5 F (36.4 C)-100.1 F (37.8 C)] 98 F (36.7 C) (05/14 0750) Pulse Rate:  [107-125] 117 (05/14 0336) Resp:  [18-35] 28 (05/14 0336) BP: (126-216)/(58-181) 126/58 (05/14 0327) SpO2:  [37 %-96 %] 94 % (05/14 0336) Weight:  [43.3 kg] 43.3 kg (05/14 0500) Last BM Date: 03/18/19  Intake/Output from previous day: 05/13 0701 - 05/14 0700 In: 1286.8 [I.V.:786.8; IV Piggyback:500] Out: 2825 [Urine:2825] Intake/Output this shift: Total I/O In: -  Out: 350 [Urine:350]  PE: Gen: In respiratory distress Heart: Tachycardic with regular rhythm, 120's on monitor Lungs: O2 sats in low 70's. Tachypnea in setting of respiratory distress. Unable to answer questions 2/2 SOB. Placed on non-rebreather and O2 sats improved to 95+%. Crackles at bases L>R.  Abd: Soft, mild distension, non-tender, no r/r/g. No peritonitis. +BS. Open midline incision c/d/i with proximal aspect of wound closing and distal portion with beefy red tissue and no erythema or drainage Msk: No LE edema. No unilateral calf swelling.   Lab Results:  Recent Labs    03/20/19 0755 2019-05-16 0650  WBC 28.8* 45.6*  HGB 8.9* 9.0*  HCT 26.5* 26.5*  PLT 430* 381   BMET Recent Labs    03/20/19 1912 2019-05-16 0650  NA 133* 136  K 3.5 4.4  CL 97* 101  CO2 26 28  GLUCOSE 141* 136*  BUN 11 16  CREATININE  0.33* 0.40*  CALCIUM 8.2* 8.0*   PT/INR No results for input(s): LABPROT, INR in the last 72 hours. CMP     Component Value Date/Time   NA 136 2020-05-919 0650   K 4.4 2020-05-919 0650   CL 101 2020-05-919 0650   CO2 28 2020-05-919 0650   GLUCOSE 136 (H) 2020-05-919 0650   BUN 16 2020-05-919 0650   CREATININE 0.40 (L) 2020-05-919 0650   CALCIUM 8.0 (L) 2020-05-919 0650   PROT 4.7 (L) 2020-05-919 0650   ALBUMIN 2.0 (L) 2020-05-919 0650   AST 27 2020-05-919 0650   ALT 21 2020-05-919 0650   ALKPHOS 94 2020-05-919 0650   BILITOT 0.9 2020-05-919 0650   GFRNONAA >60 2020-05-919 0650   GFRAA >60 2020-05-919 0650   Lipase     Component Value Date/Time   LIPASE 32 02/27/2019 2330       Studies/Results: No results found.  Anti-infectives: Anti-infectives (From admission, onward)   Start     Dose/Rate Route Frequency Ordered Stop   03/20/19 1600  ceFEPIme (MAXIPIME) 2 g in sodium chloride 0.9 % 100 mL IVPB     2 g 200 mL/hr over 30 Minutes Intravenous Every 12 hours 03/20/19 0817 03/24/19 2359   03/18/19 0400  meropenem (MERREM) 1 g in sodium chloride 0.9 % 100 mL IVPB  Status:  Discontinued     1 g 200 mL/hr over 30 Minutes Intravenous Every 12 hours 03/17/19 1421 03/20/19 0817   03/17/19 1430  meropenem (  MERREM) 1 g in sodium chloride 0.9 % 100 mL IVPB     1 g 200 mL/hr over 30 Minutes Intravenous  Once 03/17/19 1421 03/17/19 1607   03/17/19 1400  vancomycin (VANCOCIN) IVPB 1000 mg/200 mL premix  Status:  Discontinued     1,000 mg 200 mL/hr over 60 Minutes Intravenous Every 36 hours 03/17/19 1259 03/20/19 0817   02/22/2019 0515  clindamycin (CLEOCIN) IVPB 900 mg     900 mg 100 mL/hr over 30 Minutes Intravenous To Surgery 02/18/2019 0425 02/20/2019 0531   02/27/2019 0515  gentamicin (GARAMYCIN) 210 mg in dextrose 5 % 100 mL IVPB     5 mg/kg  42.2 kg 105.3 mL/hr over 60 Minutes Intravenous STAT 03/03/2019 0425 03/02/2019 0604       Assessment/Plan Cervical dystonia ABLAnemia - hbg9.0  5/14, tachycardic w/out hypotension Delirium -Confusion.Minimize sedating/narcotic meds. Orient her to time of day. Keep blinds open during the day. If improves from respiratory standpoint, will need psych consult for persistent delusions.  Protein Calorie Malnutrition- Pre-albumin 6.3, continue TPN Elevated Blood Pressure - BPstable this AM. No hx of HTN. No home medications. Continue low dose metoprolol. CCM added amlodipine on 5/13.   POD16, s/p ex lap with right colectomy secondary to cecal volvulus, Dwain Sarna, 4/28 -CT5/6c/w ileus.No intra-abdominal abscess. - Pathis benign with no evidence of colitis or malignant process -CT 5/10 showed stable ascites and LLL PNA - started on abx for this - Being tx for LLL PNA- Continue IV abx. WBC up to 45 - Febrile, tachypnic, hypoxic, and tachycardic overnight. Will obtain CTA PE study to rule out PE. Will also obtain CTAP and ABG. Transfer back to unit. Discussed with CCM. Patient code status is no intubation. Dr. Cliffton Asters discussed with husband, Cortez Nacke, who confirms this. Will discuss with floor if they will allow visitor.    FEN -NPO,IVF, TPN VTE -Lovenox, SCDs ID -Clinda/Gent periop; merrem/vanc 5/10>5/13; cefepime 5/13>> Foley - DC 4/29 Follow up - Dwain Sarna POC - Husband Greggory Stallion [phone] or [phone])   LOS: 16 days    Jacinto Halim , [facility] 03/09/2019, 8:56 AM Pager: [phone]

## 2019-04-08 NOTE — Progress Notes (Signed)
PT Cancellation Note  Patient Details Name: OBELIA SATCHWELL MRN: [id number] DOB: 1946-08-24   Cancelled Treatment:    Reason Eval/Treat Not Completed: Medical issues which prohibited therapy (respiratory distress and medical decline).  Laurina Bustle, PT, DPT Acute Rehabilitation Services Pager [phone] Office [phone]    Vanetta Mulders April 16, 2019, 12:17 PM

## 2019-04-08 NOTE — Progress Notes (Signed)
Patient remains agitated/anxiety. Restless and continues taking the NRB off. CCS called, ativan 1mg  Q4hrs intravenous PRN anxiety/agitation given. Ativan 1mg  administered. Patient calm and resting with SPO2 of 97%. Will continue monitoring

## 2019-04-08 NOTE — Progress Notes (Signed)
Pt remains restless with deserting oxygen even in the lower 30s. Patient not able to keep nasal cannula on, keeps removing it and restless. Patient remains restless,Rapid Response on rounding noted pt without O2 on. RT informed. Pt placed  On NRB, sat improved to 95%with will continue monitoring. HR elevated , Morphine PRN pain administered. Will continue monitoring

## 2019-04-08 NOTE — TOC Progression Note (Signed)
Transition of Care [facility]) - Progression Note    Patient Details  Name: [name] MRN: [id number] Date of Birth: November 26, 1945  [facility] CM/SW Contact  Doy Hutching, [place] Phone Number: 03/19/2019, 2:09 PM  Clinical Narrative:    Aware pt spouse requested that care not be escalated and for pt to be made comfortable. [employer] with APS alerted about this change and updates from yesterday.   CSW will follow at a distance.  Expected Discharge Plan: Home w Home Health Services Barriers to Discharge: No Barriers Identified  Expected Discharge Plan and Services Expected Discharge Plan: Home w Home Health Services In-house Referral: NA Discharge Planning Services: CM Consult Post Acute Care Choice: Home Health Living arrangements for the past 2 months: Single Family Home                 DME Arranged: N/A DME Agency: NA       HH Arranged: RN, PT HH Agency: [facility] Date HH Agency Contacted: 03/11/19 Time HH Agency Contacted: 1232 Representative spoke with at [facility] Agency: Lupita Leash   Social Determinants of Health (SDOH) Interventions    Readmission Risk Interventions Readmission Risk Prevention Plan 03/11/2019  Transportation Screening Complete  Some recent data might be hidden

## 2019-04-08 NOTE — Progress Notes (Signed)
SLP Cancellation Note  Patient Details Name: Dana [name] MRN: [id number] DOB: [birth year]   Cancelled treatment:       Reason Eval/Treat Not Completed: Medical issues which prohibited therapy. Patient with worsening respiratory status which started last night, with desaturations of O2. SLP spoke with patient's RN who stated that patient is unfortunately declining medically. SLP to s/o at this time due to patient's medical decline.    Elio Forget Tarrell Apr 08, 2019, 10:32 AM   Angela Nevin, MA, CCC-SLP Speech Therapy [facility] Acute Rehab Pager: [phone]

## 2019-04-08 NOTE — Progress Notes (Addendum)
I have met with and updated her husband, [name] regarding all of her events since we spoke on the phone this morning. She was transferred to the ICU where her work of breathing has continued to increase. CT scans show no evidence of PE but significant multifocal pneumonias. No evidence of anastomotic leak or intra-abdominal abscess was noted on CT. He had met with CCM team and elected to further pursue DNR + DNI status. We discussed that in the coming hours, without escalation of care, its highly likely that due to her increased work of breathing she will tire which will result in her demise. He requests that at this point we not escalate care, no pressors/cpr/intubation and make our goals be to ensure she is not suffering or uncomfortable. We discussed that options to ensure she is comfortable include a morphine which he would like [place] to proceed with this - we will plan to increase the frequency of this 1-3 mg Q1hr as needed. He would like his daughters and grandchildren be able to see her today as well and the ICU team is working to make this happen  Sharon Mt. Dema Severin, M.D. [facility]

## 2019-04-08 NOTE — Progress Notes (Signed)
PHARMACY - ADULT TOTAL PARENTERAL NUTRITION CONSULT NOTE   Pharmacy Consult for TPN Indication: Prolonged Ileus  Patient Measurements: Height: 5\' 4"  (162.6 cm) Weight: 95 lb 7.4 oz (43.3 kg) IBW/kg (Calculated) : 54.7 TPN AdjBW (KG): 42.2 Body mass index is 16.39 kg/m. Usual Weight: 80-90 lbs per patient *Weight fluctuating this admission 83 lbs up to 98 lbs.   Assessment: 73 year old female with history of colitis and weight loss admitted with cecal volvulus s/p ex lap with right colectomy on 02/20/2019. CT scan on 5/6 was consistent with ileus which has persisted. On day #13 of admission with minimal intake and intermittently NPO due the N/V, pharmacy was consulted to start TPN for persistent ileus.   5/13 pt transferred to ICU 2/2 to respiratory distress GI: Pre-albumin 9.3>>6.3. Abdomen tender, distended. NGT output 415 ml, now clamped. +Flatus. No nausea. LBM 5/13 per RN.  Pt is NPO except for meds, crushed with puree.   Endo: No hx DM. CBG 120-150's on TPN at goal. Insulin requirements in the past 24 hours: 4 units Lytes: Na/Cl  (136/101), K 4.4 , Mg 2.1 (goal > 2 for ileus). Phos down 3.5. Ca 8.5 (albumin 2.0).  Renal: SCr 0.33, BUN 11; UOP 2.70ml/kg/hr.  Pulm: NRB Cards: Hypertensive. ST, 110-115. On Lopressor, Amlodipine Hepatobil: LFTs/Tbili wnl. TGs 39 Neuro: Confused. PRN Tylenol, Morphine. Tenderness noted. Pain score 0. Wellbutrin, Lexapro for anxiety/depression.  ID: Merrem and Vancomycin 5/10 >> 5/13; Starting Cefepime 5/13 WBC elevated 42>28>45, afebrile    TPN Access: PICC to be placed 5/10 per RN TPN start date: 03/17/19 Nutritional Goals (per RD recommendation on 5/6): KCal: 1400-1600 Protein: 70-80 kg Fluid: 1.6L/day  Goal TPN rate is 60 ml/hr (provides 80 g protein, 230 g dextrose, 43.2g lipids and 1535 kcals meeting 100% of patients needs)  Current Nutrition:  TPN Clears 5/12; taking in 0-5%  Plan:  Continue TPN at 40mL/hr. This TPN provides 80 g protein,  230 g dextrose, 43.2g lipids and 1535 kcals meeting 100% of patients needs Electrolytes in TPN: Continue with increase Na, Will continue with current K, Mg, and phos today. Other lytes standard; maximize Cl.  Add MVI to TPN- trace elements are on back order, will only place in TPN on Monday, Wednesday and Friday. Continue sensitive SSI q6h and adjust as needed Monitor TPN labs Will get BMET, Mag, Phos for Friday F/U RD recommendations, resolution of ileus and ability to advance diet   Ewing Schlein, PharmD PGY1 Pharmacy Resident 04/01/2019    7:39 AM Please check AMION for all [facility] Pharmacy numbers

## 2019-04-08 DEATH — deceased

## 2020-07-28 IMAGING — DX PORTABLE ABDOMEN - 1 VIEW
1 series · 1 of 1 positions shown · non-contrast
Comparison: 03/07/2019 abdominal CT

CLINICAL DATA: Nausea and vomiting

EXAM:
PORTABLE ABDOMEN - 1 VIEW

[abdomen kub]
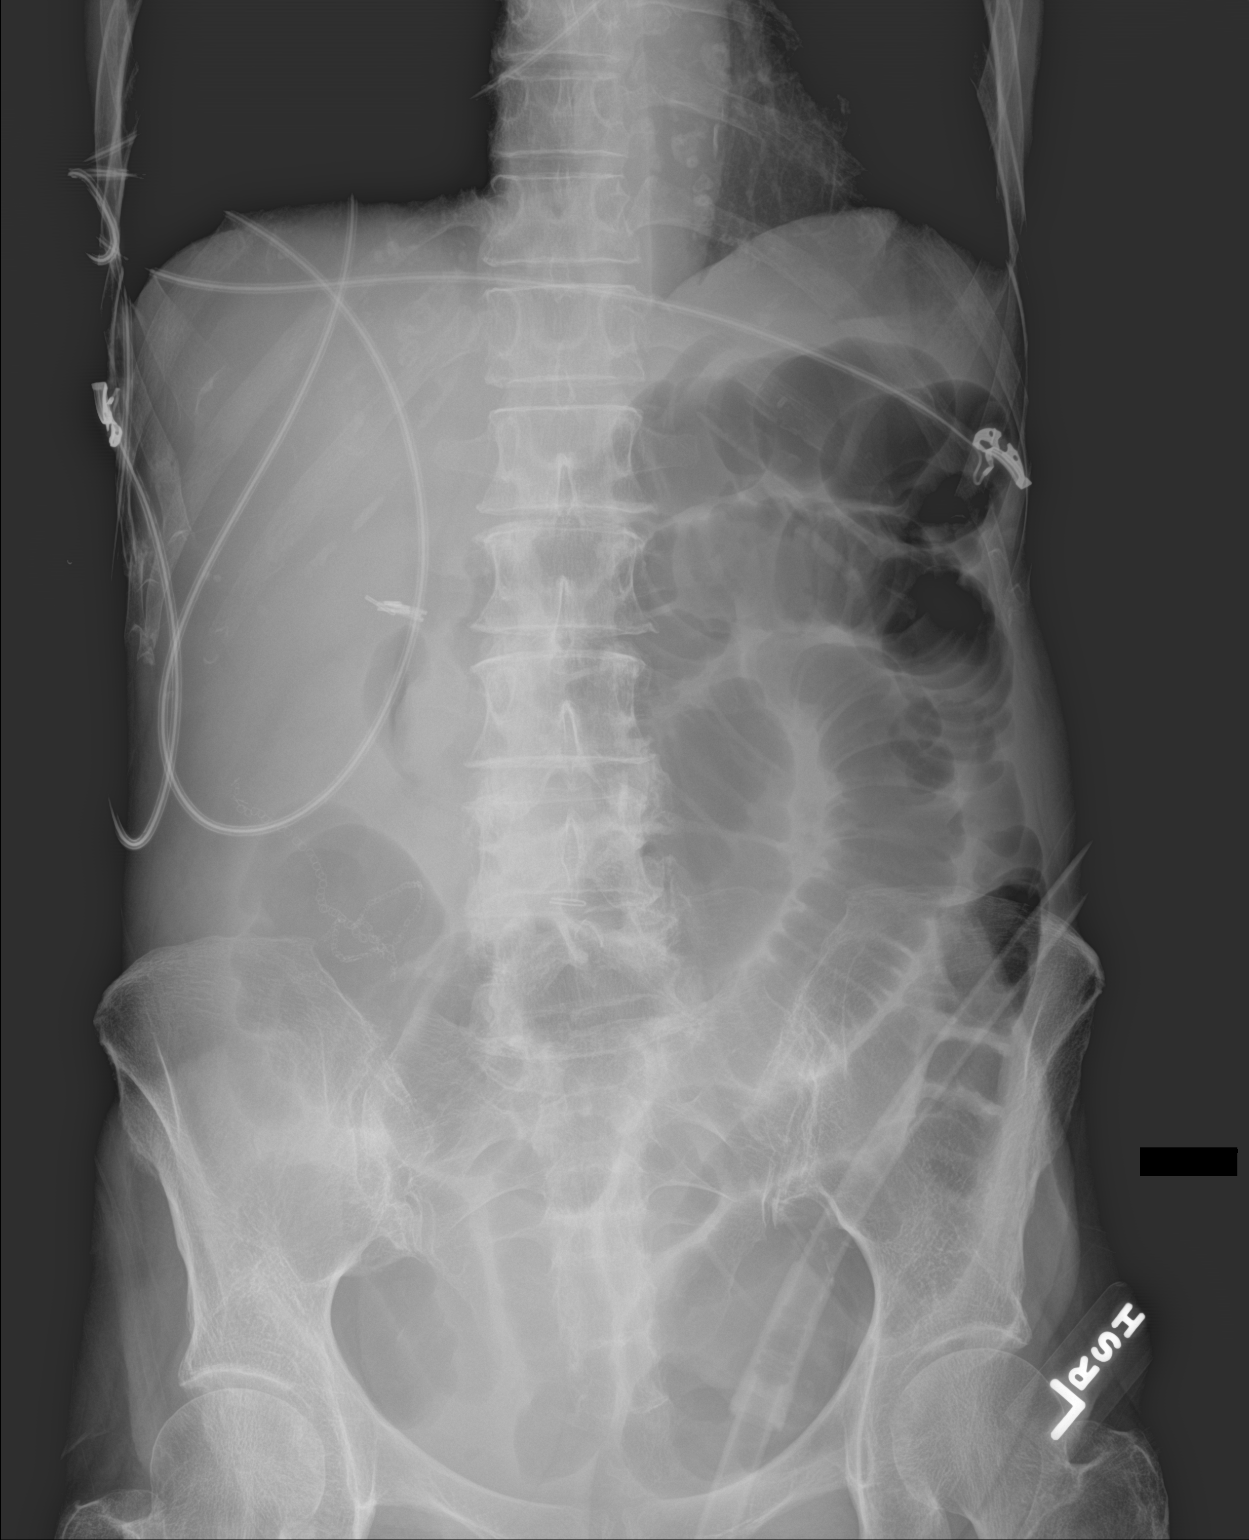

[1 of 1 positions shown; findings below may reference images not displayed]

FINDINGS: Dilated small bowel with probable gas filling of the transverse
colon and descending colon as well. Bowel sutures seen in the right
abdomen. No concerning mass effect or gas collection. Limited
visualization of the lower lungs due to over penetration.
Cholecystectomy clips.
IMPRESSION: Dilated bowel likely affecting both colon and small bowel,
compatible with postoperative ileus.
